# Patient Record
Sex: Female | Born: 1950 | Race: White | Hispanic: No | Marital: Married | Smoking: Never smoker
Health system: Southern US, Community
[De-identification: ages and names within clinical notes are randomized; demographics above are authoritative.]

## PROBLEM LIST (undated history)

## (undated) DIAGNOSIS — R112 Nausea with vomiting, unspecified: Secondary | ICD-10-CM

## (undated) DIAGNOSIS — I480 Paroxysmal atrial fibrillation: Secondary | ICD-10-CM

## (undated) DIAGNOSIS — N200 Calculus of kidney: Secondary | ICD-10-CM

## (undated) DIAGNOSIS — Z9889 Other specified postprocedural states: Secondary | ICD-10-CM

## (undated) DIAGNOSIS — E119 Type 2 diabetes mellitus without complications: Secondary | ICD-10-CM

## (undated) DIAGNOSIS — I10 Essential (primary) hypertension: Secondary | ICD-10-CM

## (undated) HISTORY — PX: HEMORRHOID SURGERY: SHX153

## (undated) HISTORY — DX: Paroxysmal atrial fibrillation: I48.0

## (undated) HISTORY — PX: BREAST LUMPECTOMY: SHX2

## (undated) HISTORY — PX: ADENOIDECTOMY: SUR15

## (undated) HISTORY — DX: Morbid (severe) obesity due to excess calories: E66.01

## (undated) HISTORY — PX: BREAST SURGERY: SHX581

## (undated) HISTORY — PX: TONSILLECTOMY: SUR1361

## (undated) HISTORY — PX: ECTOPIC PREGNANCY SURGERY: SHX613

---

## 1999-04-27 ENCOUNTER — Ambulatory Visit (HOSPITAL_BASED_OUTPATIENT_CLINIC_OR_DEPARTMENT_OTHER): Admission: RE | Admit: 1999-04-27 | Discharge: 1999-04-27 | Payer: Self-pay | Admitting: Plastic Surgery

## 2000-07-12 ENCOUNTER — Encounter: Payer: Self-pay | Admitting: Family Medicine

## 2000-07-12 ENCOUNTER — Ambulatory Visit (HOSPITAL_COMMUNITY): Admission: RE | Admit: 2000-07-12 | Discharge: 2000-07-12 | Payer: Self-pay | Admitting: Family Medicine

## 2000-08-09 ENCOUNTER — Other Ambulatory Visit: Admission: RE | Admit: 2000-08-09 | Discharge: 2000-08-09 | Payer: Self-pay | Admitting: Obstetrics and Gynecology

## 2000-08-09 ENCOUNTER — Encounter (INDEPENDENT_AMBULATORY_CARE_PROVIDER_SITE_OTHER): Payer: Self-pay

## 2000-08-28 ENCOUNTER — Ambulatory Visit (HOSPITAL_COMMUNITY): Admission: RE | Admit: 2000-08-28 | Discharge: 2000-08-28 | Payer: Self-pay | Admitting: Obstetrics and Gynecology

## 2000-08-28 ENCOUNTER — Encounter (INDEPENDENT_AMBULATORY_CARE_PROVIDER_SITE_OTHER): Payer: Self-pay | Admitting: Specialist

## 2001-09-11 ENCOUNTER — Other Ambulatory Visit: Admission: RE | Admit: 2001-09-11 | Discharge: 2001-09-11 | Payer: Self-pay | Admitting: Obstetrics and Gynecology

## 2001-10-03 HISTORY — PX: ABDOMINAL HYSTERECTOMY: SHX81

## 2002-04-24 ENCOUNTER — Encounter (INDEPENDENT_AMBULATORY_CARE_PROVIDER_SITE_OTHER): Payer: Self-pay

## 2002-04-24 ENCOUNTER — Observation Stay (HOSPITAL_COMMUNITY): Admission: RE | Admit: 2002-04-24 | Discharge: 2002-04-25 | Payer: Self-pay | Admitting: Obstetrics and Gynecology

## 2002-08-09 ENCOUNTER — Ambulatory Visit (HOSPITAL_COMMUNITY): Admission: RE | Admit: 2002-08-09 | Discharge: 2002-08-09 | Payer: Self-pay | Admitting: Family Medicine

## 2002-08-09 ENCOUNTER — Encounter: Payer: Self-pay | Admitting: Family Medicine

## 2003-04-30 ENCOUNTER — Encounter: Payer: Self-pay | Admitting: Emergency Medicine

## 2009-02-02 ENCOUNTER — Emergency Department (HOSPITAL_COMMUNITY): Admission: EM | Admit: 2009-02-02 | Discharge: 2009-02-03 | Payer: Self-pay | Admitting: *Deleted

## 2011-01-11 LAB — DIFFERENTIAL
Basophils Relative: 0 % (ref 0–1)
Eosinophils Absolute: 0.3 10*3/uL (ref 0.0–0.7)
Eosinophils Relative: 4 % (ref 0–5)
Lymphs Abs: 1.7 10*3/uL (ref 0.7–4.0)
Monocytes Absolute: 0.5 10*3/uL (ref 0.1–1.0)
Monocytes Relative: 6 % (ref 3–12)
Neutrophils Relative %: 70 % (ref 43–77)

## 2011-01-11 LAB — CBC
HCT: 40 % (ref 36.0–46.0)
Hemoglobin: 13.7 g/dL (ref 12.0–15.0)
MCHC: 34.1 g/dL (ref 30.0–36.0)
MCV: 92.5 fL (ref 78.0–100.0)
RBC: 4.33 MIL/uL (ref 3.87–5.11)
WBC: 8.3 10*3/uL (ref 4.0–10.5)

## 2011-01-11 LAB — POCT CARDIAC MARKERS
CKMB, poc: <1 ng/mL — ABNORMAL LOW (ref 1.0–8.0)
CKMB, poc: <1 ng/mL — ABNORMAL LOW (ref 1.0–8.0)
Troponin i, poc: <0.05 ng/mL (ref 0.00–0.09)
Troponin i, poc: <0.05 ng/mL (ref 0.00–0.09)

## 2011-01-11 LAB — BASIC METABOLIC PANEL
BUN: 11 mg/dL (ref 6–23)
CO2: 28 mEq/L (ref 19–32)
Calcium: 9.4 mg/dL (ref 8.4–10.5)
Chloride: 98 mEq/L (ref 96–112)
Creatinine, Ser: 0.59 mg/dL (ref 0.4–1.2)
GFR calc Af Amer: >60 mL/min (ref >60)
GFR calc non Af Amer: >60 mL/min (ref >60)
Glucose, Bld: 107 mg/dL — ABNORMAL HIGH (ref 70–99)
Potassium: 3.1 mEq/L — ABNORMAL LOW (ref 3.5–5.1)
Sodium: 137 mEq/L (ref 135–145)

## 2011-01-11 LAB — BRAIN NATRIURETIC PEPTIDE: Pro B Natriuretic peptide (BNP): 40 pg/mL (ref 0.0–100.0)

## 2011-02-18 NOTE — H&P (Signed)
[facility]  Patient:    Lindsey Jordan, Lindsey Jordan                       MRN: [id number] Adm. Date:  08/23/00 Attending:  Janine Limbo, M.D. CC:         Dario Guardian, M.D.   History and Physical  HISTORY OF PRESENT ILLNESS:   The patient is a 60 year old female, para 1, 0, 3, 1, who presents for a hysteroscopy and a dilatation and curettage.  The patient has a history of irregular bleeding, a fibroid uterus, and an ovarian cyst.  She is status post left salpingo-oophorectomy because of an ectopic pregnancy.  She has had a cesarean section in the past.  She is also status post laparoscopy.  She had a D&C associated with her miscarriages.  The patient has a history of HSV, but denies any other history of sexually-transmitted infections.  An ultrasound was performed and it showed a 9.6 cm x 6.1 cm uterus.  The endometrial stripe measured 10.0 mm.  A 2.8 cm fibroid was noted.  The right ovary measured 4.2 cm x 1.8 cm.  It contained a simple cyst measuring 2.0 cm x 1.8 cm.  The patient has taken hormone replacement therapy in the past, but has discontinued this medication.  Her most recent Pap smear was in March 2001, and it was within normal limits.  Her last mammogram was in March 2001, and it was within normal limits.  The patient had an ultrasound of her gallbladder in November 2001, and the upper abdomen appeared normal, and there was no evidence of cholelithiasis.  OBSTETRICAL HISTORY:          The patient has had one preterm vaginal delivery, two miscarriages, and one ectopic pregnancy.  The patient did require Clomid and Pergonal for conception.  PAST MEDICAL HISTORY:         The patient has a history of mitral valve prolapse.  She also has migraine headaches.  She has had kidney stones in the past.  ALLERGIES:                    SHELL FISH.  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  She drinks  alcohol socially.  She denies cigarette use and recreational drug use.  REVIEW OF SYSTEMS:            The patient has urinary incontinence.  She had the usual childhood diseases.  She wears contact lenses.  FAMILY HISTORY:               The patients father has emphysema and hypertension.  The patients mother had kidney stones and thyroid disease.  PHYSICAL EXAMINATION:  GENERAL:                      Weight 203 pounds, height 5 feet 8 inches.  HEENT:                        Within normal limits.  CHEST:                        Clear.  HEART:                        Regular  rate and rhythm.  BREASTS:                      Without masses.  ABDOMEN:                      Nontender.  EXTREMITIES:                  Within normal limits.  NEUROLOGIC:                   Normal.  PELVIC:                       External genitalia normal.  Vagina is normal except relaxation is noted.  Cervix is nontender.  No lesions appreciated. Uterus is upper limits normal size.  Adnexa:  No masses.  RECTOVAGINAL:                 Examination confirms.  ASSESSMENT:                   1. Irregular uterine bleeding.                               2. Fibroid uterus.                               3. Thickened endometrium on ultrasound.                               4. Cervical stenosis (unable to obtain an                                  endometrial biopsy in the office                                  comfortably).  PLAN:                         We discussed the options for management.  The patient elects to proceed at this point with a hysteroscopy and a dilatation and curettage.  She understands and accepts the risks of, but not limited to, anesthetic complications, bleeding, infections, and possible damage to the surrounding organs. DD:  08/23/00 TD:  08/23/00 Job: [id number]

## 2011-02-18 NOTE — Op Note (Signed)
[facility]  Patient:    Lindsey Jordan, Lindsey Jordan                     MRN: [id number] Adm. Date:  [id number] Disc. Date: [id number] Attending:  Leonard Schwartz CC:         Dario Guardian, M.D.   Operative Report  PREOPERATIVE DIAGNOSES:       1. Irregular uterine bleeding.                               2. Cervical stenosis.                               3. Fibroid uterus.  POSTOPERATIVE DIAGNOSES:      1. Irregular uterine bleeding.                               2. Cervical stenosis.                               3. Fibroid uterus.                               4. Endometrial polyp.  PROCEDURE:                    1. Diagnostic hysteroscopy.                               2. Dilatation and curettage.  SURGEON:                      Janine Limbo, M.D.  ANESTHESIA:                   IV sedation and paracervical block.  DISPOSITION:                  Ms. Mccoll is a 60 year old female with the above mentioned diagnoses.  An endometrial biopsy was attempted in the office but we were unable to get endometrial cells.  The patient understands the indications for her procedure and she accepts the risks of, but not limited to, anesthetic complications, bleeding, infection, and possible damage to the surrounding organs.  FINDINGS:                     The uterus was 8 weeks size and slightly irregular.  No adnexal masses were appreciated.  The uterus sounded to 9 cm. On hysteroscopy the patient was noted to have small endometrial polyps.  No other pathology was noted.  PROCEDURE:                    The patient was taken to the operating room where she was given medication through her IV line.  The perineum and vagina were prepped with multiple layers of Hibiclens.  The bladder was drained of urine.  Examination under anesthesia was performed.  The patient was sterilely draped.  A paracervical block was placed using 10 cc of 1/2% Marcaine.   An endocervical curettage was obtained.  The cervix and uterus sounded to 9 cm. The cervix  was grasped with a dilator.  The uterine cavity was then explored using the diagnostic hysteroscope.  She was found to have polyps.  The cavity was then curetted using a medium sharp curette until the cavity was felt to be completely clean.  Repeat hysteroscopy was performed and no polyps were noted. The instruments were removed from the uterine cavity.  There were two lacerations on the cervix from the single tooth tenaculum.  Two figure-of-eight sutures of 2-0 Chromic were placed.  Hemostasis was adequate. All instruments were then removed.  The patients examination was repeated and the uterus was firm.  The patient was returned to the supine position and then taken to the recovery room in stable condition.  The estimated blood loss was 50 cc.  The fluid deficit was 140 cc.  FOLLOW-UP INSTRUCTIONS:       The patient will return to see Dr. Stefano Gaul in two to three weeks for follow-up examination.  She was given a copy of the postoperative sheet as prepared by the [facility] for patients who have undergone a D&C.  She will call for questions or concerns.  DISCHARGE MEDICATIONS:        Tylenol No. 3 one to two p.o. q.4h. p.r.n. pain. DD:  08/28/00 TD:  08/28/00 Job: [id number]

## 2011-02-18 NOTE — H&P (Signed)
[facility]  Patient:    Lindsey Jordan, Lindsey Jordan Visit Number: [id number] MRN: [id number]          Service Type: Attending:  Janine Limbo, M.D. Dictated by:   Janine Limbo, M.D. Adm. Date:  04/24/02   CC:         Dario Guardian, M.D.   History and Physical  HISTORY OF PRESENT ILLNESS:   The patient is a 60 year old female para 0-1-3-1 who presents for a laparoscopically-assisted vaginal hysterectomy with right salpingo-oophorectomy and cystoscopy.  The patient has a long history of irregular menstrual cycles.  She had a hysteroscopy with dilatation and curettage performed in 2001 and she was found to have polyps.  She is known to have a post menopausal FSH.  The patient has been treated with hormone replacement therapy but continues to have irregular and bothersome cycles. She has also had a laparoscopy in the past as well as D&Cs associated with miscarriages.  The patient had a left salpingo-oophorectomy performed because of an ectopic pregnancy.  She has had a cesarean section in the past.  An ultrasound in the past has shown a fibroid uterus.  The patient has a history of HSV but denied any other history of sexually transmitted infections.  Her most recent Pap smear was in December 2002 and it was within normal limits.  OBSTETRICAL HISTORY:          The patient had a preterm delivery by cesarean section.  She has had two miscarriages and one ectopic pregnancy.  She did require Clomid and Pergonal for conception.  PAST MEDICAL HISTORY:         The patient has a history of mitral valve prolapse.  She has kidney stones and migraine headaches.  DRUG ALLERGIES:               The patient is allergic to [facility] and SHELLFISH. Biaxin upsets her stomach, however.  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 She drinks alcohol socially.  She denies cigarette use and recreational drug use.  REVIEW OF SYSTEMS:             Noncontributory.  FAMILY HISTORY:               The patients father has emphysema and hypertension.  The patients mother has kidney stones and thyroid disease.  PHYSICAL EXAMINATION:  VITAL SIGNS:                  Weight 197 pounds.  HEENT:                        Within normal limits.  CHEST:                        Clear.  HEART:                        Regular rate and rhythm.  BREASTS:                      Without masses.  ABDOMEN:                      Nontender.  EXTREMITIES:                  Within  normal limits.  NEUROLOGIC:                   Grossly normal.  PELVIC:                       External genitalia is normal.  The vagina is normal except for relaxation.  The cervix is nontender.  The uterus is upper limits of normal size, adnexa no masses, and rectovaginal exam confirms.  ASSESSMENT:                   1. Irregular uterine bleeding.                               2. Fibroid uterus.  PLAN:                         After carefully reviewing the options for management, the patient has decided to proceed with laparoscopically-assisted vaginal hysterectomy and right salpingo-oophorectomy.  She understands the indications for her procedure and she accepts the risks of, but not limited to, anesthetic complications, bleeding, infections, and possible damage to the surrounding organs. Dictated by:   Janine Limbo, M.D. Attending:  Janine Limbo, M.D. DD:  04/15/02 TD:  04/15/02 Job: [id number]

## 2011-02-18 NOTE — Discharge Summary (Signed)
NAME:  Lindsey Jordan, Lindsey Jordan                        ACCOUNT NO.:  [id number]   MEDICAL RECORD NO.:  [id number]                   PATIENT TYPE:  OBV   LOCATION:  9314                                 FACILITY:  [facility]   PHYSICIAN:  Elmira J. Lowell Guitar, P.A.              DATE OF BIRTH:  Jun 13, 1951   DATE OF ADMISSION:  04/24/2002  DATE OF DISCHARGE:  04/25/2002                                 DISCHARGE SUMMARY   DISCHARGE DIAGNOSES:  1. Fibroid uterus.  2. Irregular bleeding.  3. Pelvic adhesions.   OPERATION:  On the day of admission, the patient underwent a  laparoscopically assisted vaginal hysterectomy with a right salpingo-  oophorectomy, lysis of adhesions and cystoscopy tolerating all procedures  well.   HISTORY OF PRESENT ILLNESS:  Ms. Stjames is a 60 year old female para 0131  who presents for a laparoscopically assisted vaginal hysterectomy with right  salpingo-oophorectomy and cystoscopy due to a long history of irregular  menstrual cycles.  Please see patient's dictated History and Physical  examination for details.   PHYSICAL EXAMINATION:  Weight 197 pounds.  GENERAL:  Within normal limits.  PELVIC:  External genitalia is normal.  Vagina is normal except for  relaxation.  Cervix is nontender.  Uterus upper limits of normal size.  Adnexa no masses.  RECTOVAGINAL:  Examination confirms.   HOSPITAL COURSE:  On the day of admission, the patient underwent  aforementioned procedures, tolerating them all well.  Postoperative course  was unremarkable with patient quickly resuming bowel and bladder function by  postoperative day 1 and deemed ready for discharge home.  Postoperative  hemoglobin 11.5, preoperative hemoglobin 13.4.   DISCHARGE MEDICATIONS:  1. Vicodin one to tablets every 4-6 hours as needed for pain.  2. Phenergan 25 mg one tablet four times daily as needed for nausea.  3. Stool softeners 100 mg twice daily until bowel movements are regular.  4. Ibuprofen 600 mg  one tablet with food every 6 hours for five days and     then as needed for pain.   FOLLOW UP:  Patient has a six-week postoperative examination scheduled on  May 31, 2002 at 10:30 a.m. with Dr. Leonard Schwartz.   DISCHARGE INSTRUCTIONS:  Patient was given a copy of [facility]  Obstetrics and Gynecology postoperative instruction sheet.  She was further  advised to avoid driving for two weeks, heavy lifting for four weeks and  intercourse for six weeks.   DISCHARGE DIET:  Without restrictions.    FINAL PATHOLOGY:  Uterus with cervix, right tube and ovary:  Cervix with no  pathologic abnormalities, benign proliferative endometrium, adenomyosis,  benign 2.5 cm subserosal leiomyoma, right ovary with rare 9-kDa and  granuloma, right fallopian tube with no pathologic abnormalities, uterine  serosal fibrous adhesions.  Elmira J. Adline Peals.    EJP/MEDQ  D:  05/17/2002  T:  05/17/2002  Job:  [id number]

## 2011-02-18 NOTE — Op Note (Signed)
[facility]  Patient:    Lindsey Jordan, Lindsey Jordan Visit Number: [id number] MRN: [id number]          Service Type: DSU Location: 9300 [id number] Attending Physician:  Leonard Schwartz Dictated by:   Janine Limbo, M.D. Proc. Date: 04/24/02 Admit Date:  04/24/2002 Discharge Date: 04/25/2002                             Operative Report  DATE OF BIRTH:                December 26, 1950  PREOPERATIVE DIAGNOSES:       1. Fibroid uterus.                               2. Irregular menstrual cycles.  POSTOPERATIVE DIAGNOSES:      1. Fibroid uterus.                               2. Irregular menstrual cycles.                               3. Pelvic adhesive disease.  PROCEDURE:                    1. Laparoscopically assisted vaginal                                  hysterectomy.                               2. Laparoscopic right salpingo-oophorectomy.                               3. Laparoscopic lysis of adhesions.                               4. Cystoscopy.  SURGEON:                      Janine Limbo, M.D.  FIRST ASSISTANT:              Henreitta Leber, P.A.  ANESTHESIA:                   General.  DISPOSITION:                  Lindsey Jordan is a 60 year old female who presents with the above mentioned diagnoses.  She is status post left salpingo-oophorectomy.  She understands the indications for her procedure and she accepts the risks of, but not limited to, anesthetic complications, bleeding, infections, and possible damage to the surrounding organs.  We have discussed hormone replacement therapy and we have also discussed the risks and benefits thereof.  The [facility] initiative study was reviewed with the patient.  FINDINGS:                     The patients uterus was upper limits normal size and there was a pedunculated myoma on the anterior surface of the uterus  which measured about 4 cm in diameter.  The patient had moderate  adhesions between the omentum and the anterior abdominal wall.  She had moderate adhesions between the anterior uterus and the bladder.  She had dense adhesions between the left fallopian tube and the left posterior uterus.  The appendix appeared normal.  The gallbladder and the liver appeared normal.  The bowel appeared normal.  PROCEDURE:                    The patient was taken to the operating room where a general anesthetic was given.  The patients abdomen, perineum, and vagina were prepped with multiple layers of Betadine.  A Foley catheter was placed in the bladder.  A Hulka tenaculum was placed inside the uterus.  The patient was then sterilely draped.  The subumbilical area was injected with 4 cc of 0.5% Marcaine with epinephrine.  A subumbilical incision was made and the incision was extended through the subcutaneous tissue, the fascia, and the anterior peritoneum.  The Hasson cannula was sutured into place using 0 Vicryl.  A pneumoperitoneum was obtained.  The laparoscope was inserted and the pelvic structures were visualized with findings as mentioned above.  An incision was made in the right lower quadrant after injecting the skin with 3 cc of 0.5% Marcaine with epinephrine.  The 5 mm trocar was placed in the right lower quadrant under direct visualization.  We then began the lysis of adhesions portion of our procedure so that we could adequately view the entire pelvis.  The omentum was removed from the anterior abdominal wall using a combination of electrocautery, sharp dissection, and then blunt dissection. Once we were able to visualize the pelvis better we then took pictures of the pelvic and abdominal structures.  The left lower quadrant was injected with 3 cc of 0.5% Marcaine with epinephrine.  An incision was made and a second 5 mm trocar was placed in the lower abdomen.  We then began to lyse the adhesions between the anterior uterus and the bladder.  The adhesions in  the posterior cul-de-sac were lysed.  We were then able to identify the ureter and follow the ureter through its course on the right broad ligament.  The right round ligament was cauterized and cut.  The right utero-ovarian ligament and the right fallopian tube were then cauterized and cut.  The right infundibulopelvic ligament was then skeletonized.  Care was taken not to damage any of the underlying vital structures.  We then placed two 0 Vicryl endo loop sutures around the right infundibulopelvic ligament and tied them securely.  The right infundibulopelvic ligament was cut and the right ovary and the right fallopian tube were placed into the posterior cul-de-sac.  We then skeletonized the right uterine artery using a combination of Bovie cautery and then sharp dissection.  At this point we felt that we were ready to proceed with the vaginal hysterectomy portion of the procedure.  A weighted speculum was placed in the vagina and the cervix was injected with a diluted solution of Pitressin and saline.  A circumferential incision was made around the cervix.  The vaginal mucosa was advanced both posteriorly and anteriorly. The posterior cul-de-sac was sharply entered.  Alternating from right to left the paracervical tissues, parametrial tissues, and uterine arteries were clamped, cut, sutured, and tied securely.  The dissection was made more difficult because of poor mobility of the uterus even in spite of the previous laparoscopic approach.  We  were then able to invert the uterus through the posterior colpotomy.  The remaining uterine attachments were clamped and cut. The uterus was removed from the operative field.  The ovary and fallopian tube were then removed from the posterior cul-de-sac.  The remaining tissues were suture ligated.  Hemostasis was noted to be adequate.  The sutures attached to the uterosacral ligaments were then brought out through the vaginal angles and tied  securely.  A McCall culdoplasty suture was placed in the posterior cul-de-sac incorporating the uterosacral ligament bilaterally and the  peritoneum of the posterior cul-de-sac.  A final check was made for hemostasis and hemostasis was adequate.  The vaginal cuff was then closed using figure-of-eight sutures incorporating the anterior vaginal mucosa, the anterior peritoneum, the posterior peritoneum, and the posterior vaginal mucosa.  The culdoplasty suture was placed and the apex of the vagina was noted to elevate into the mid pelvis.  The patient was then given an ampule of indigo carmine.  The diagnostic cystoscope was inserted in the patients bladder and there was no evidence of damage to the mucosa of the bladder. Blue dye was noted to pass through both ureteral orifices.  The cystoscope was removed and the Foley catheter was reinserted into the bladder.  The surgeon then changed his gloves.  A pneumoperitoneum was once again obtained.  The pelvis was irrigated.  Hemostasis was noted to be adequate throughout.  The fluid that was placed in the abdomen was then aspirated.  The bowel was carefully checked and there was no evidence of trocar damage.  All instruments were then removed.  The fascia in the subumbilical area was then closed using figure-of-eight sutures.  The skin of the subumbilical laceration and the two suprapubic lacerations were then closed using 3-0 Vicryl.  Sponge, needle, and instrument counts were correct.  0 Vicryl was the suture material used throughout the procedure except where otherwise mentioned.  The patient was noted to have blue urine as she was taken to the recovery room.  She tolerated her procedure well.  She did experience hypertension during her procedure which required IV antihypertensives. Dictated by:   Janine Limbo, M.D. Attending Physician:  Leonard Schwartz DD:  04/24/02 TD:  04/28/02 Job: [id number]

## 2011-06-14 ENCOUNTER — Other Ambulatory Visit: Payer: Self-pay | Admitting: Physician Assistant

## 2013-03-20 ENCOUNTER — Encounter (HOSPITAL_COMMUNITY): Payer: Self-pay

## 2013-03-21 ENCOUNTER — Encounter (HOSPITAL_COMMUNITY): Payer: Self-pay

## 2013-03-26 ENCOUNTER — Encounter (HOSPITAL_COMMUNITY): Payer: Self-pay

## 2013-03-26 ENCOUNTER — Ambulatory Visit (HOSPITAL_COMMUNITY)
Admission: RE | Admit: 2013-03-26 | Discharge: 2013-03-26 | Disposition: A | Discharge status: Home / Self Care | Payer: Self-pay | Source: Ambulatory Visit | Attending: Obstetrics and Gynecology | Admitting: Obstetrics and Gynecology

## 2013-03-26 VITALS — BP 118/76 | Temp 97.8°F | Ht <= 58 in | Wt 229.2 lb

## 2013-03-26 DIAGNOSIS — Z1239 Encounter for other screening for malignant neoplasm of breast: Secondary | ICD-10-CM

## 2013-03-26 HISTORY — DX: Essential (primary) hypertension: I10

## 2013-03-26 NOTE — Patient Instructions (Signed)
Taught Hal Hope how to perform BSE and gave educational materials to take home. Patient did not need a Pap smear today due to a history of a hysterectomy for benign reasons. Let patient know that she does not need any further Pap smears due to her history of a hysterectomy for benign reasons. Referred patient to [facility] for a right breast biopsy per recommendation. Appointment scheduled for today, March 26, 2013 at 1345. Patient aware of appointment and will be there. Loel Ro Karn verbalized understanding.  Ruhama Lehew, Kathaleen Maser, RN 11:46 AM

## 2013-03-26 NOTE — Progress Notes (Signed)
Patient referred to [facility] by [facility] due to recommending a right breast biopsy. Screening mammogram completed at [facility] on 03/20/2013 and follow up right breast diagnostic mammogram completed 03/21/2013.  Pap Smear:    Pap smear not completed today. Last Pap smear was in 2003 per patient prior to hysterectomy and normal. Per patient no history of an abnormal Pap smear. Patient has a history of a hysterectomy for fibroids 04/24/2002. Patient no longer needs Pap smears per BCCCP and ACOG guidelines due to her history of a hysterectomy for benign reasons. No Pap smear results in EPIC.  Physical exam: Breasts Right breast larger than left breast. No skin abnormalities bilateral breasts. No nipple retraction bilateral breasts. No nipple discharge bilateral breasts. No lymphadenopathy. No lumps palpated bilateral breasts. Patient complained of right lower breast tenderness during exam. Referred patient to [facility] for a right breast biopsy per recommendation. Appointment scheduled for today, March 26, 2013 at 1345  Pelvic/Bimanual No Pap smear completed today since patient has a history of a hysterectomy for benign reasons. Pap smear not indicated per BCCCP guidelines.

## 2013-05-14 ENCOUNTER — Other Ambulatory Visit: Payer: Self-pay | Admitting: Radiology

## 2013-05-14 ENCOUNTER — Encounter (HOSPITAL_COMMUNITY): Payer: Self-pay

## 2013-11-14 ENCOUNTER — Encounter (HOSPITAL_COMMUNITY): Payer: Self-pay

## 2014-04-25 ENCOUNTER — Ambulatory Visit (INDEPENDENT_AMBULATORY_CARE_PROVIDER_SITE_OTHER): Payer: BC Managed Care – PPO | Admitting: Cardiology

## 2014-04-25 ENCOUNTER — Encounter: Payer: Self-pay | Admitting: Cardiology

## 2014-04-25 VITALS — BP 142/80 | HR 63 | Ht 68.0 in | Wt 225.0 lb

## 2014-04-25 DIAGNOSIS — R0600 Dyspnea, unspecified: Secondary | ICD-10-CM

## 2014-04-25 DIAGNOSIS — E7800 Pure hypercholesterolemia, unspecified: Secondary | ICD-10-CM

## 2014-04-25 DIAGNOSIS — R011 Cardiac murmur, unspecified: Secondary | ICD-10-CM

## 2014-04-25 DIAGNOSIS — R0989 Other specified symptoms and signs involving the circulatory and respiratory systems: Secondary | ICD-10-CM

## 2014-04-25 DIAGNOSIS — R0609 Other forms of dyspnea: Secondary | ICD-10-CM

## 2014-04-25 DIAGNOSIS — R9431 Abnormal electrocardiogram [ECG] [EKG]: Secondary | ICD-10-CM

## 2014-04-25 DIAGNOSIS — I119 Hypertensive heart disease without heart failure: Secondary | ICD-10-CM

## 2014-04-25 NOTE — Progress Notes (Signed)
Lindsey Jordan Date of Birth:  04-Dec-1950 [facility] [address] [place], [place]  [address] [phone]        Fax   [phone]   History of Present Illness: This pleasant 63 year old woman is seen by me for the first time today.  She is seen at the request of Dr. Avriana Ada.  She is being seen because of history of hypercholesterolemia.  She has a strong family history of heart disease.  She herself has not had documented heart problems.  In 2010 she went to the emergency room with chest pain and was released after her cardiac enzymes returned to normal.  The patient does not get any regular exercise because of some problems with her knees and arthritis.  Her last stress test was about 20 years ago in [place].  She has been experiencing some exertional dyspnea.  Her EKG today shows evidence of a questionable old inferior wall myocardial infarction.  The patient has a past history of a heart murmur.  She has not had an echocardiogram.  She has a history of high blood pressure.  She does not smoke.  She has a history of hypercholesterolemia.  She has had previous trials of Lipitor and Crestor and WelChol all of which caused deep muscle discomfort more in her arms and in her legs.  She has had problems with mild exogenous obesity and her weight is up about 10 pounds over the past year. Her family history reveals that her father died of a heart attack at age 46.  Her mother died of congestive heart failure and had a history of atrial fibrillation and amiodarone toxicity affecting her lungs. Her most recent lipid panel done on 03/17/14 by Dr. Tamala Julian shows total cholesterol 317, triglycerides 357, HDL 41, calculated LDL 204, and non-HDL to 75 her cholesterol/HDL ratio is 7.7.  She is not currently on any lipid lowering agents. Current Outpatient Prescriptions  Medication Sig Dispense Refill  . acyclovir (ZOVIRAX) 200 MG capsule Take 200 mg by mouth 3 (three) times daily.  As directed      . atenolol (TENORMIN) 100 MG tablet Take 100 mg by mouth daily.      . clonazePAM (KLONOPIN) 1 MG tablet Take 1 mg by mouth as directed. 1/2-1 tablet at bedtime as needed      . Cranberry 200 MG CAPS Take by mouth daily.      Marland Kitchen FLUTICASONE PROPIONATE  HFA IN Inhale into the lungs. 2 puffs each nostril daily      . Lactobacillus (ACIDOPHILUS) TABS Take by mouth daily.      . Multiple Vitamin (MULTIVITAMIN) capsule Take 1 capsule by mouth daily.      . traZODone (DESYREL) 100 MG tablet Take 100 mg by mouth at bedtime.      . triamterene-hydrochlorothiazide (MAXZIDE-25) 37.5-25 MG per tablet Take 1 tablet by mouth daily.       No current facility-administered medications for this visit.    Allergies  Allergen Reactions  . Biaxin [Clarithromycin]   . Statins     Side effects  . Welchol [Colesevelam Hcl]     Side effects    Patient Active Problem List   Diagnosis Date Noted  . Dyspnea on exertion 04/25/2014  . Hypercholesteremia 04/25/2014  . Heart murmur 04/25/2014  . Benign hypertensive heart disease without heart failure 04/25/2014  . Abnormal EKG 04/25/2014    History  Smoking status  . Never Smoker  Smokeless tobacco  . Never Used    History  Alcohol Use No    Family History  Problem Relation Age of Onset  . Hypertension Father   . Breast cancer Maternal Aunt     Review of Systems: Constitutional: no fever chills diaphoresis or fatigue or change in weight.  Head and neck: no hearing loss, no epistaxis, no photophobia or visual disturbance. Respiratory: No cough, shortness of breath or wheezing. Cardiovascular: No chest pain peripheral edema, palpitations. Gastrointestinal: No abdominal distention, no abdominal pain, no change in bowel habits hematochezia or melena. Genitourinary: No dysuria, no frequency, no urgency, no nocturia. Musculoskeletal:No arthralgias, no back pain, no gait disturbance or myalgias. Neurological: No dizziness, no  headaches, no numbness, no seizures, no syncope, no weakness, no tremors. Hematologic: No lymphadenopathy, no easy bruising. Psychiatric: No confusion, no hallucinations, no sleep disturbance.    Physical Exam: Filed Vitals:   04/25/14 1025  BP: 142/80  Pulse: 63   the general appearance reveals a well-developed slightly obese woman in no acute distress.The head and neck exam reveals pupils equal and reactive.  Extraocular movements are full.  There is no scleral icterus.  The mouth and pharynx are normal.  The neck is supple.  The carotids reveal no bruits.  The jugular venous pressure is normal.  The  thyroid is not enlarged.  There is no lymphadenopathy.  The chest is clear to percussion and auscultation.  There are no rales or rhonchi.  Expansion of the chest is symmetrical.  The precordium is quiet.  The first heart sound is normal.  The second heart sound is physiologically split.  There is grade 1/6 systolic ejection murmur at the aortic area.  No diastolic murmur. There is no abnormal lift or heave.  The abdomen is soft and nontender.  The bowel sounds are normal.  The liver and spleen are not enlarged.  There are no abdominal masses.  There are no abdominal bruits.  Extremities reveal good pedal pulses.  There is no phlebitis or edema.  There is no cyanosis or clubbing.  Strength is normal and symmetrical in all extremities.  There is no lateralizing weakness.  There are no sensory deficits.  The skin is warm and dry.  There is no rash.   EKG shows normal sinus rhythm with voltage for LVH and questionable old inferior wall myocardial infarction with narrow Q waves in 3 and aVF.  Assessment / Plan: 1. severe hypercholesterolemia, intolerant of statins and WelChol 2. heart murmur 3. exertional dyspnea 4. abnormal EKG  Plan: We will have her return for a two-dimensional echocardiogram to evaluate her heart murmur and her dyspnea.  We will also have her return for a likely scan Myoview  stress test to evaluate her dyspnea and her abnormal EKG.  She is at risk for premature coronary disease with her marked hypercholesterolemia and strong family history.  We will refer her to Dr. Alferd Apa in the lipid clinic.  Thanks for the opportunity to see this pleasant woman with you.

## 2014-04-25 NOTE — Assessment & Plan Note (Signed)
EKG today shows normal sinus rhythm with questionable old inferior wall MI.  She does not have any history of a known prior infarction.  She did have an emergency room visit in 2010 for chest pain.

## 2014-04-25 NOTE — Assessment & Plan Note (Signed)
The patient states that she's had a heart murmur which has been heard intermittently over the past several years

## 2014-04-25 NOTE — Assessment & Plan Note (Signed)
Patient has severe hypercholesterolemia and is intolerant of WelChol and statins.  We will plan to refer her to the lipid clinic

## 2014-04-25 NOTE — Assessment & Plan Note (Signed)
The patient has a history of essential hypertension.  She has been experiencing exertional dyspnea.

## 2014-04-25 NOTE — Patient Instructions (Signed)
Your physician recommends that you continue on your current medications as directed. Please refer to the Current Medication list given to you today.  Your physician has requested that you have an echocardiogram. Echocardiography is a painless test that uses sound waves to create images of your heart. It provides your doctor with information about the size and shape of your heart and how well your heart's chambers and valves are working. This procedure takes approximately one hour. There are no restrictions for this procedure.  Your physician has requested that you have a lexiscan myoview. For further information please visit [email or web address]. Please follow instruction sheet, as given.  Refer to [employer] D in lipid clinic

## 2014-05-01 ENCOUNTER — Ambulatory Visit (INDEPENDENT_AMBULATORY_CARE_PROVIDER_SITE_OTHER): Payer: BC Managed Care – PPO | Admitting: Pharmacist

## 2014-05-01 VITALS — Wt 226.0 lb

## 2014-05-01 DIAGNOSIS — Z79899 Other long term (current) drug therapy: Secondary | ICD-10-CM

## 2014-05-01 DIAGNOSIS — E7800 Pure hypercholesterolemia, unspecified: Secondary | ICD-10-CM

## 2014-05-01 MED ORDER — PRAVASTATIN SODIUM 40 MG PO TABS: 40.0000 mg | ORAL_TABLET | Freq: Every evening | ORAL | Status: DC

## 2014-05-01 NOTE — Progress Notes (Signed)
Patient is a pleasant 63 y.o. Female referred to lipid clinic by Dr. Mare Ferrari, who saw patient for Dr. Idora Ada with [facility].  Patient has a h/o TC > 300 and LDL of 246 mg/dL in 08/2012 [facility]).  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  Her EKG this month showed evidence of a questionable old interior wall MI.  She is due for a stress test in 2 weeks.  Patient did go to the ER in 2010 with chest pressure, but was apparently not diagnosed with MI at that time.  She tried lipitor in the late 1990's and failed due to severe muscle aches.  Ten years later she was tried on Crestor, however had to stop due to severe muscle aches again.  Between 2011 and 2013 she tried [facility] a few times, however muscle aches and GI discomfort led her to stop this as well.  She has a brother with elevated cholesterol as well, but she's not sure how high.  She had 4 uncles who had MI's in their 60-70's.  Her father died of an MI at 85.  Mother did not have CAD.  Patient is concerned about taking statins given her history of severe aches on them in the past.  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  She should be treated aggressively with LDL goal of at least < 100 mg/dL if possible.  She is currently eating a low fat diet, and is going the [employer] weight loss program online.  She has lost 5 lbs in her first 2 weeks.  RF:  FH, LDL > 200 mg/dL at baseline, family h/o CAD, possible old MI on EKG - LDL goal < 100 mg/dL Meds:  Not on lipid lowering meds Intolerant:  Crestor, Lipitor, Welchol (muscle aches)  Labs:   03/2014:  TC 317, TG 357, LDL 204, HDL 41, non-HDL 275, LFT normal (not on lipid lowering therapy) --- likely a falsely low LDL due to TG of 357.  Her non-HDL is 275 mg/dL is very elevated !  Current Outpatient Prescriptions  Medication Sig Dispense Refill  . acyclovir (ZOVIRAX) 200 MG capsule Take 200 mg by mouth 3 (three) times  daily. As directed      . atenolol (TENORMIN) 100 MG tablet Take 100 mg by mouth daily.      . clonazePAM (KLONOPIN) 1 MG tablet Take 1 mg by mouth as directed. 1/2-1 tablet at bedtime as needed      . Cranberry 200 MG CAPS Take by mouth daily.      Marland Kitchen FLUTICASONE PROPIONATE  HFA IN Inhale into the lungs. 2 puffs each nostril daily      . Lactobacillus (ACIDOPHILUS) TABS Take by mouth daily.      . Multiple Vitamin (MULTIVITAMIN) capsule Take 1 capsule by mouth daily.      . traZODone (DESYREL) 100 MG tablet Take 100 mg by mouth at bedtime.      . triamterene-hydrochlorothiazide (MAXZIDE-25) 37.5-25 MG per tablet Take 1 tablet by mouth daily.       No current facility-administered medications for this visit.   Allergies  Allergen Reactions  . Biaxin [Clarithromycin]   . Statins     Severe muscle aches with Lipitor (1998) and Crestor (2008)  . Welchol [Colesevelam Hcl]     Side effects - muscle and GI   Family History  Problem Relation Age of Onset  .  Hypertension Father   . Breast cancer Maternal Aunt

## 2014-05-01 NOTE — Assessment & Plan Note (Signed)
Patient and I discussed treatment options today.  She is interested in non-statin options if possible given her history of failing statin.  I explained that these have the best evidence and significant reduction.  We did discuss PCSK-9 inhibitors and I explained that this could be an option if her LDL doesn't get to goal on statin alone. She and her husband have a Sports administrator plan they pay for.   Will have her start with pravastatin 40 mg qhs, and reduce dose if she develops muscle aches.  If aches occur, she will cut this down to 20 mg qd, and down to 10 mg qd if muscle aches persist.  Will recheck lipid / liver in 4 weeks, and see me 1 day later.  She is getting a stress test in 2 weeks.  If LDL remains elevated on her max tolerated dose of statin, will likely try to add Praluent.  Her likely FH diagnosis could get her on therapy, however may have evidence of heart disease on stress test as well.  We discussed this plan today, and she is very interested.  May need to get her up to pravastatin 80 mg qd before we proceed.  Will see her back in 4 weeks after labs and stress test.

## 2014-05-01 NOTE — Patient Instructions (Signed)
1.  Start pravastatin today at 40 mg once daily.  Take in the evening.  If muscle aches occur, cut this in half (20 mg pill).  If this still causes muscle aches, cut this down to 1/4 pill (10 mg daily) 2.  Find out your brother's LDL and Total Cholesterol - I want to know what the levels were before he started cholesterol medication. 3.  Recheck cholesterol in 4 weeks (05/28/14 - fasting labs).  See me 1 day after blood work (05/29/14) at 4:00 pm. 4.  Keep stress test appointment 05/14/14.

## 2014-05-14 ENCOUNTER — Ambulatory Visit (HOSPITAL_BASED_OUTPATIENT_CLINIC_OR_DEPARTMENT_OTHER): Payer: BC Managed Care – PPO | Admitting: Cardiology

## 2014-05-14 ENCOUNTER — Ambulatory Visit (HOSPITAL_COMMUNITY): Payer: BC Managed Care – PPO | Attending: Cardiovascular Disease | Admitting: Radiology

## 2014-05-14 VITALS — BP 134/73 | Ht 68.0 in | Wt 225.0 lb

## 2014-05-14 DIAGNOSIS — R0600 Dyspnea, unspecified: Secondary | ICD-10-CM

## 2014-05-14 DIAGNOSIS — R0609 Other forms of dyspnea: Secondary | ICD-10-CM | POA: Diagnosis present

## 2014-05-14 DIAGNOSIS — I10 Essential (primary) hypertension: Secondary | ICD-10-CM | POA: Diagnosis not present

## 2014-05-14 DIAGNOSIS — R011 Cardiac murmur, unspecified: Secondary | ICD-10-CM

## 2014-05-14 DIAGNOSIS — R0989 Other specified symptoms and signs involving the circulatory and respiratory systems: Secondary | ICD-10-CM | POA: Diagnosis not present

## 2014-05-14 DIAGNOSIS — R9431 Abnormal electrocardiogram [ECG] [EKG]: Secondary | ICD-10-CM

## 2014-05-14 DIAGNOSIS — R0602 Shortness of breath: Secondary | ICD-10-CM

## 2014-05-14 MED ORDER — REGADENOSON 0.4 MG/5ML IV SOLN
0.4000 mg | Freq: Once | INTRAVENOUS | Status: AC
  Administered 2014-05-14: 0.4 mg via INTRAVENOUS

## 2014-05-14 MED ORDER — TECHNETIUM TC 99M SESTAMIBI GENERIC - CARDIOLITE
11.0000 | Freq: Once | INTRAVENOUS | Status: AC | PRN
Start: 2014-05-14 — End: 2014-05-14
  Administered 2014-05-14: 11 via INTRAVENOUS

## 2014-05-14 MED ORDER — TECHNETIUM TC 99M SESTAMIBI GENERIC - CARDIOLITE
33.0000 | Freq: Once | INTRAVENOUS | Status: AC | PRN
  Administered 2014-05-14: 33 via INTRAVENOUS

## 2014-05-14 NOTE — Progress Notes (Signed)
Echo performed. 

## 2014-05-14 NOTE — Progress Notes (Signed)
[facility] [address] [place], [place] [address] [phone]    Cardiology Nuclear Med Study  Lindsey Jordan is a 63 y.o. female     MRN : [id number]     DOB: 1951-08-31  Procedure Date: 05/14/2014  Nuclear Med Background Indication for Stress Test:  Evaluation for Ischemia and Abnormal EKG History:  No H/O CAD Cardiac Risk Factors: Hypertension and Lipids  Symptoms:  DOE   Nuclear Pre-Procedure Caffeine/Decaff Intake:  None NPO After: 7:00pm   Lungs:  clear O2 Sat: 100% on room air. IV 0.9% NS with Angio Cath:  22g  IV Site: R Hand  IV Started by:  Crissie Figures, RN  Chest Size (in):  38 Cup Size: C  Height: 5\' 8"  (1.727 m)  Weight:  225 lb (102.059 kg)  BMI:  Body mass index is 34.22 kg/(m^2). Tech Comments:  N/A    Nuclear Med Study 1 or 2 day study: 1 day  Stress Test Type:  Lexiscan  Reading MD: N/A  Order Authorizing Provider:  Darlin Coco, MD  Resting Radionuclide: Technetium 91m Sestamibi  Resting Radionuclide Dose: 11.0 mCi   Stress Radionuclide:  Technetium 43m Sestamibi  Stress Radionuclide Dose: 33.0 mCi           Stress Protocol Rest HR: 55 Stress HR: 76  Rest BP: 134/73 Stress BP: 154/68  Exercise Time (min): n/a METS: n/a   Predicted Max HR: 156 bpm % Max HR: 48.72 bpm Rate Pressure Product: 11704   Dose of Adenosine (mg):  n/a Dose of Lexiscan: 0.4 mg  Dose of Atropine (mg): n/a Dose of Dobutamine: n/a mcg/kg/min (at max HR)  Stress Test Technologist: Perrin Maltese, EMT-P  Nuclear Technologist:  Annye Rusk, CNMT     Rest Procedure:  Myocardial perfusion imaging was performed at rest 45 minutes following the intravenous administration of Technetium 46m Sestamibi. Rest ECG: Sinus bradycardia, otherwise normal   Stress Procedure:  The patient received IV Lexiscan 0.4 mg over 15-seconds.  Technetium 72m Sestamibi injected at 30-seconds. This patient had sob, chest heaviness, and felt weird with the  Lexiscan injection. Quantitative spect images were obtained after a 45 minute delay. Stress ECG: No significant change from baseline ECG  QPS Raw Data Images:  Normal; no motion artifact; normal heart/lung ratio. Stress Images:  Normal homogeneous uptake in all areas of the myocardium. Rest Images:  Normal homogeneous uptake in all areas of the myocardium. Subtraction (SDS):  No evidence of ischemia. Transient Ischemic Dilatation (Normal <1.22):  1.17 Lung/Heart Ratio (Normal <0.45):  0.32  Quantitative Gated Spect Images QGS EDV:  84 ml QGS ESV:  28 ml  Impression Exercise Capacity:  Lexiscan with no exercise. BP Response:  Normal blood pressure response. Clinical Symptoms:  Mild chest pain/dyspnea. ECG Impression:  No significant ST segment change suggestive of ischemia. Comparison with Prior Nuclear Study: No previous nuclear study performed  Overall Impression:  Normal stress nuclear study.  LV Ejection Fraction: 67%.  LV Wall Motion:  NL LV Function; NL Wall Motion  Dorothy Spark 05/14/2014

## 2014-05-16 ENCOUNTER — Telehealth: Payer: Self-pay | Admitting: Cardiology

## 2014-05-16 NOTE — Telephone Encounter (Signed)
New message       Patient want test results

## 2014-05-16 NOTE — Telephone Encounter (Signed)
Advised patient of echo and myoview  

## 2014-05-16 NOTE — Telephone Encounter (Signed)
Message copied by Earvin Hansen on Fri May 16, 2014  3:53 PM ------      Message from: Darlin Coco      Created: Thu May 15, 2014  7:56 AM       Please report.  The echocardiogram showed normal systolic function and no significant valve problems.  There was a slight leak of her aortic valve which might account for the heart murmur.      Also, of the nuclear stress test did not show any evidence of ischemia or any evidence of the old heart attack.  Continue current medication and regular exercise program. ------

## 2014-05-28 ENCOUNTER — Other Ambulatory Visit (INDEPENDENT_AMBULATORY_CARE_PROVIDER_SITE_OTHER): Payer: BC Managed Care – PPO

## 2014-05-28 DIAGNOSIS — Z79899 Other long term (current) drug therapy: Secondary | ICD-10-CM

## 2014-05-28 DIAGNOSIS — E7800 Pure hypercholesterolemia, unspecified: Secondary | ICD-10-CM

## 2014-05-28 LAB — LDL CHOLESTEROL, DIRECT: Direct LDL: 160.5 mg/dL

## 2014-05-28 LAB — HEPATIC FUNCTION PANEL
ALBUMIN: 3.6 g/dL (ref 3.5–5.2)
ALK PHOS: 84 U/L (ref 39–117)
ALT: 22 U/L (ref 0–35)
AST: 24 U/L (ref 0–37)
Bilirubin, Direct: 0 mg/dL (ref 0.0–0.3)
TOTAL PROTEIN: 7 g/dL (ref 6.0–8.3)
Total Bilirubin: 0.5 mg/dL (ref 0.2–1.2)

## 2014-05-28 LAB — LIPID PANEL
CHOL/HDL RATIO: 5
CHOLESTEROL: 216 mg/dL — AB (ref 0–200)
HDL: 45.5 mg/dL (ref >39.00)
NonHDL: 170.5
TRIGLYCERIDES: 245 mg/dL — AB (ref 0.0–149.0)
VLDL: 49 mg/dL — AB (ref 0.0–40.0)

## 2014-05-29 ENCOUNTER — Ambulatory Visit (INDEPENDENT_AMBULATORY_CARE_PROVIDER_SITE_OTHER): Payer: BC Managed Care – PPO | Admitting: Pharmacist

## 2014-05-29 VITALS — Wt 231.0 lb

## 2014-05-29 DIAGNOSIS — E7800 Pure hypercholesterolemia, unspecified: Secondary | ICD-10-CM

## 2014-05-29 DIAGNOSIS — Z79899 Other long term (current) drug therapy: Secondary | ICD-10-CM

## 2014-05-29 MED ORDER — PRAVASTATIN SODIUM 10 MG PO TABS: 10.0000 mg | ORAL_TABLET | Freq: Every day | ORAL | Status: DC

## 2014-05-29 MED ORDER — ALIROCUMAB 75 MG/ML ~~LOC~~ SOPN: 75.0000 mg | PEN_INJECTOR | SUBCUTANEOUS | Status: DC

## 2014-05-29 NOTE — Assessment & Plan Note (Addendum)
Patient and I had a long discussion about treatment options today.  Unfortunately she hasn't been able to tolerate atorvastatin, crestor, or pravastatin at doses higher than 10 mg qd.  This is the first daily statin she has been able to tolerate, and 10 mg is the highest dose she can tolerate.  She has been cutting the 40 mg pill into 1/4, and would like to change this to the 10 mg pill to take 1 tablet daily (presciption sent).  Welchol caused GI and muscle side effects in past, and Zetia not potent enough, and has been shown to be inferior in patients with HeFH (ENHANCE Trial) to is not appropriate in patient. Praluent is appropriate given she is on highest tolerated statin dose, can't take high potency statin (Crestor or Lipitor), and has diagnosis of HeFH.  Will have her start on low dose Praluent 75 mg SQ q 2 weeks today, and recheck lipid / liver in 5 weeks.  Samples 75 mg # 3 given today in the office and prescription sent in.  Patient counseled on Praluent use, storage, administration, and possible side effects.  She will call back if she has problems before her 5 weeks appointment.

## 2014-05-29 NOTE — Patient Instructions (Signed)
1.  Continue pravastatin 10 mg daily as you couldn't tolerate 20 mg or 40 mg daily. 2.  Start Praluent 75 mg injection once every 2 weeks (take 1st of the month, and middle of the month) 3.  Recheck cholesterol / liver in 5 weeks (07/09/14 fasting labs - lab opens at 7:30 am), and see Lindsey Jordan next day 07/10/14 at 11:30 am

## 2014-05-29 NOTE — Progress Notes (Signed)
Patient is a pleasant 63 y.o. female referred to lipid clinic by Dr. Mare Ferrari due to LDL > 240 mg/dL baseline and had a hard time tolerating lipid lowering meds.  Patient had a h/o TC > 300 and LDL of 246 mg/dL in 08/2012 [facility]).  She was started on pravastatin 40 mg qd a month ago and told to reduce dose if muscle aches occurred.  She developed muscle weakness on pravastatin 40 mg qd, which also occurred when she cut dose down to 20 mg qd, however once she reduced to pravastatin 10 mg qd, she has been able to tolerate medication.  Has been on pravastatin 10 mg qd for past 2 weeks without problems.  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  No corneal arcus seen on examination.   Her EKG recently showed evidence of a questionable old interior wall MI.  No evidence of ischemia found on stress test.  Patient did go to the ER in 2010 with chest pressure, but was apparently not diagnosed with MI at that time.  She tried lipitor in the late 1990's and failed due to severe muscle aches.  Ten years later she was tried on Crestor, however had to stop due to severe muscle aches again.  Between 2011 and 2013 she tried [facility] a few times, however muscle aches and GI discomfort led her to stop this as well.  She has a brother with elevated cholesterol as well, but she's not sure how high.  She had 4 uncles who had MI's in their 60-70's.  Her father died of an MI at 29.  Mother did not have CAD.    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  Patient asked about screening her children today, and I confirmed that cascade screening should be done with simple lipid panel if they haven't had one done recently.   She should be treated aggressively with LDL goal of at least < 100 mg/dL if possible.  RF:  HeFH, LDL > 240 mg/dL at baseline, family h/o CAD, possible old MI on  EKG - LDL goal < 100 mg/dL at least Meds:  Pravastatin 10 mg qd Intolerant:  Crestor, Lipitor, Welchol, Pravastatin 20-40 mg qd (muscle aches)  Labs:   05/2014:  TC 216, TG 245, LDL 160, HDL 46, non-HDL 171, LFTs normal (pravastatin 10 mg qd) 03/2014:  TC 317, TG 357, LDL 204, HDL 41, non-HDL 275, LFT normal (not on lipid lowering therapy) --- likely a falsely low LDL due to TG of 357.  Her non-HDL is 275 mg/dL is very elevated !  Current Outpatient Prescriptions  Medication Sig Dispense Refill  . acyclovir (ZOVIRAX) 200 MG capsule Take 200 mg by mouth 3 (three) times daily. As directed      . atenolol (TENORMIN) 100 MG tablet Take 100 mg by mouth daily.      . clonazePAM (KLONOPIN) 1 MG tablet Take 1 mg by mouth as directed. 1/2-1 tablet at bedtime as needed      . Cranberry 200 MG CAPS Take by mouth daily.      Marland Kitchen FLUTICASONE PROPIONATE  HFA IN Inhale into the lungs. 2 puffs each nostril daily      . Lactobacillus (ACIDOPHILUS) TABS Take by mouth daily.      . Multiple Vitamin (MULTIVITAMIN) capsule Take 1 capsule by mouth daily.      Marland Kitchen  pravastatin (PRAVACHOL) 40 MG tablet Take 10 mg by mouth every evening.      . traZODone (DESYREL) 100 MG tablet Take 100 mg by mouth at bedtime.      . triamterene-hydrochlorothiazide (MAXZIDE-25) 37.5-25 MG per tablet Take 1 tablet by mouth daily.       No current facility-administered medications for this visit.   Allergies  Allergen Reactions  . Biaxin [Clarithromycin]   . Statins     Severe muscle aches with Lipitor (1998) and Crestor (2008)  . Welchol [Colesevelam Hcl]     Side effects - muscle and GI   Family History  Problem Relation Age of Onset  . Hypertension Father   . Breast cancer Maternal Aunt

## 2014-06-03 ENCOUNTER — Telehealth: Payer: Self-pay | Admitting: Cardiology

## 2014-06-03 NOTE — Telephone Encounter (Signed)
New message         Pt needs the enrollment form completed and sent to [phone] for medication Praluent

## 2014-06-04 NOTE — Telephone Encounter (Signed)
Spoke with Faye Ramsay D and he is faxing information

## 2014-06-19 ENCOUNTER — Telehealth: Payer: Self-pay | Admitting: Pharmacist

## 2014-06-19 NOTE — Telephone Encounter (Signed)
Letter of medical necessity faxed to [facility] (on 06/19/14)  to appeal their initial denial of Praluent.  Patient should be placed in the [facility] through MyPraluent soon.

## 2014-06-24 ENCOUNTER — Telehealth: Payer: Self-pay | Admitting: Pharmacist

## 2014-06-24 NOTE — Telephone Encounter (Signed)
Patient has been Approved by [facility] for Praluent.  Reference number [id number].  Effective 06/12/14 - 12/11/14.  Will need to fax in continuation section of PA to [facility] early March. Patient is aware of good news.  She will call us early March to remind [place] to send in continuation section.

## 2014-07-09 ENCOUNTER — Other Ambulatory Visit (INDEPENDENT_AMBULATORY_CARE_PROVIDER_SITE_OTHER): Payer: BC Managed Care – PPO | Admitting: *Deleted

## 2014-07-09 DIAGNOSIS — E7800 Pure hypercholesterolemia, unspecified: Secondary | ICD-10-CM

## 2014-07-09 DIAGNOSIS — Z79899 Other long term (current) drug therapy: Secondary | ICD-10-CM

## 2014-07-09 LAB — LDL CHOLESTEROL, DIRECT: Direct LDL: 89.5 mg/dL

## 2014-07-09 LAB — HEPATIC FUNCTION PANEL
ALBUMIN: 3.4 g/dL — AB (ref 3.5–5.2)
ALK PHOS: 87 U/L (ref 39–117)
ALT: 31 U/L (ref 0–35)
AST: 28 U/L (ref 0–37)
BILIRUBIN DIRECT: 0 mg/dL (ref 0.0–0.3)
BILIRUBIN TOTAL: 0.5 mg/dL (ref 0.2–1.2)
Total Protein: 7.2 g/dL (ref 6.0–8.3)

## 2014-07-09 LAB — LIPID PANEL
Cholesterol: 174 mg/dL (ref 0–200)
HDL: 38.4 mg/dL — ABNORMAL LOW (ref >39.00)
NONHDL: 135.6
TRIGLYCERIDES: 317 mg/dL — AB (ref 0.0–149.0)
Total CHOL/HDL Ratio: 5
VLDL: 63.4 mg/dL — ABNORMAL HIGH (ref 0.0–40.0)

## 2014-07-10 ENCOUNTER — Ambulatory Visit (INDEPENDENT_AMBULATORY_CARE_PROVIDER_SITE_OTHER): Payer: BC Managed Care – PPO | Admitting: Pharmacist

## 2014-07-10 DIAGNOSIS — E7800 Pure hypercholesterolemia, unspecified: Secondary | ICD-10-CM

## 2014-07-10 NOTE — Progress Notes (Signed)
Quick Note:  Please report to patient. The recent labs are stable. LDL much better. She is followed in the lipid clinic. ______

## 2014-07-10 NOTE — Patient Instructions (Signed)
Continue Praluent 75mg  every 2 weeks.   Recheck labs in 3 months.   Please call [name] at [phone] if you have not received your shipment within the next 2 weeks.

## 2014-07-11 NOTE — Assessment & Plan Note (Signed)
Pt's LDL significantly improved with addition of Praluent.  She has had ~50% reduction.  She is below her goal of <100 mg/dL.  She is tolerating therapy with no problems.  Will continue Praluent 75mg  every 2 weeks and follow up in 6 months.  She has been approved through [facility] but has not received her first shipment of medication.  Approval refaxed to MyPraluent.  Pt will call if she has any additional problems receiving therapy.

## 2014-07-11 NOTE — Progress Notes (Signed)
HPI Patient is a pleasant 63 y.o. female referred to lipid clinic by Dr. Mare Ferrari due to LDL > 240 mg/dL baseline and had a hard time tolerating lipid lowering meds.  Patient had a h/o TC > 300 and LDL of 246 mg/dL in 08/2012 [facility]).  She was started on pravastatin 40 mg qd in June and told to reduce dose if muscle aches occurred.  She developed muscle weakness on pravastatin 40 mg qd, which also occurred when she cut dose down to 20 mg qd, she reduced to pravastatin 10 mg qd, she has been able to tolerate medication for a while but has recently had to decrease to 5mg  daily.  At her visit in August, she was started on Praluent 75mg  every 2 weeks.  She has tolerated this with no problems.  She has been approved by her insurance but has not received her shipment yet.   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  She does not notice a difference in these since starting Praluent.  No corneal arcus seen on examination.    PAST THERAPIES: She tried lipitor in the late 1990's and failed due to severe muscle aches.  Ten years later she was tried on Crestor, however had to stop due to severe muscle aches again.  Between 2011 and 2013 she tried [facility] a few times, however muscle aches and GI discomfort led her to stop this as well.    FAMILY HISTORY: She has a brother with elevated cholesterol as well, but she's not sure how high.  She had 4 uncles who had MI's in their 60-70's.  Her father died of an MI at 84.  Mother did not have CAD.    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  .   She should be treated aggressively with LDL goal of at least < 100 mg/dL if possible.  RF:  HeFH, LDL > 240 mg/dL at baseline, family h/o CAD, possible old MI on EKG - LDL goal < 100 mg/dL at least Meds:  Pravastatin 5 mg qd; Praluent 75mg  every 2 weeks Intolerant:  Crestor,  Lipitor, Welchol, Pravastatin 20-40 mg qd (muscle aches)  Labs:   07/2014: TC 174, TG 317, LDL 89 HDL 38, non-HDL 135, LFTS are WNL (pravastatin 5mg  and Praluent 75mg  every 2 weeks) 05/2014:  TC 216, TG 245, LDL 160, HDL 46, non-HDL 171, LFTs normal (pravastatin 10 mg qd) 03/2014:  TC 317, TG 357, LDL 204, HDL 41, non-HDL 275, LFT normal (not on lipid lowering therapy) --- likely a falsely low LDL due to TG of 357.  Her non-HDL is 275 mg/dL is very elevated !  Current Outpatient Prescriptions  Medication Sig Dispense Refill  . acyclovir (ZOVIRAX) 200 MG capsule Take 200 mg by mouth 3 (three) times daily. As directed      . Alirocumab (PRALUENT) 75 MG/ML SOPN Inject 75 mg into the skin every 14 (fourteen) days.  2 pen  11  . atenolol (TENORMIN) 100 MG tablet Take 100 mg by mouth daily.      . clonazePAM (KLONOPIN) 1 MG tablet Take 1 mg by mouth as directed. 1/2-1 tablet at bedtime as needed      . Cranberry 200 MG CAPS Take by mouth daily.      Marland Kitchen FLUTICASONE PROPIONATE  HFA IN Inhale into the lungs. 2 puffs  each nostril daily      . Lactobacillus (ACIDOPHILUS) TABS Take by mouth daily.      . Multiple Vitamin (MULTIVITAMIN) capsule Take 1 capsule by mouth daily.      . pravastatin (PRAVACHOL) 10 MG tablet Take 1 tablet (10 mg total) by mouth daily.  30 tablet  5  . traZODone (DESYREL) 100 MG tablet Take 100 mg by mouth at bedtime.      . triamterene-hydrochlorothiazide (MAXZIDE-25) 37.5-25 MG per tablet Take 1 tablet by mouth daily.       No current facility-administered medications for this visit.   Allergies  Allergen Reactions  . Biaxin [Clarithromycin]   . Statins     Severe muscle aches with Lipitor (1998) and Crestor (2008)  . Welchol [Colesevelam Hcl]     Side effects - muscle and GI   Family History  Problem Relation Age of Onset  . Hypertension Father   . Breast cancer Maternal Aunt

## 2014-07-22 ENCOUNTER — Telehealth: Payer: Self-pay | Admitting: Pharmacist

## 2014-07-22 NOTE — Telephone Encounter (Signed)
New message     Talk to [name] about cholesterol medication [name] prescribed.  She has questions about the cost.

## 2014-07-25 NOTE — Telephone Encounter (Signed)
Spoke with pt on 10/21.  She is having problems with her copay being >$200.  She should qualify for the discount card.  Will discuss with company to determine why this was not used.

## 2014-07-29 NOTE — Telephone Encounter (Signed)
[facility] with [facility].  Copay card activated and her cost will be $0.  She is aware and expecting a call from [facility] to schedule shipment.

## 2014-08-04 ENCOUNTER — Encounter: Payer: Self-pay | Admitting: Cardiology

## 2014-09-25 ENCOUNTER — Other Ambulatory Visit: Payer: Self-pay | Admitting: Cardiology

## 2014-10-15 ENCOUNTER — Other Ambulatory Visit (INDEPENDENT_AMBULATORY_CARE_PROVIDER_SITE_OTHER): Payer: Self-pay | Admitting: *Deleted

## 2014-10-15 DIAGNOSIS — E7800 Pure hypercholesterolemia, unspecified: Secondary | ICD-10-CM

## 2014-10-15 LAB — HEPATIC FUNCTION PANEL
ALK PHOS: 93 U/L (ref 39–117)
ALT: 41 U/L — AB (ref 0–35)
AST: 38 U/L — ABNORMAL HIGH (ref 0–37)
Albumin: 3.7 g/dL (ref 3.5–5.2)
BILIRUBIN TOTAL: 0.3 mg/dL (ref 0.2–1.2)
Bilirubin, Direct: 0 mg/dL (ref 0.0–0.3)
TOTAL PROTEIN: 6.8 g/dL (ref 6.0–8.3)

## 2014-10-15 LAB — LIPID PANEL
CHOL/HDL RATIO: 3
CHOLESTEROL: 154 mg/dL (ref 0–200)
HDL: 44.5 mg/dL (ref >39.00)
NONHDL: 109.5
TRIGLYCERIDES: 204 mg/dL — AB (ref 0.0–149.0)
VLDL: 40.8 mg/dL — ABNORMAL HIGH (ref 0.0–40.0)

## 2014-10-15 LAB — LDL CHOLESTEROL, DIRECT: Direct LDL: 86 mg/dL

## 2014-10-16 ENCOUNTER — Encounter: Payer: Self-pay | Admitting: Pharmacist

## 2014-10-16 ENCOUNTER — Ambulatory Visit (INDEPENDENT_AMBULATORY_CARE_PROVIDER_SITE_OTHER): Payer: Self-pay | Admitting: Pharmacist

## 2014-10-16 VITALS — Wt 232.7 lb

## 2014-10-16 DIAGNOSIS — E7800 Pure hypercholesterolemia, unspecified: Secondary | ICD-10-CM

## 2014-10-16 NOTE — Progress Notes (Signed)
HPI Patient is a pleasant 64 y.o. female referred to lipid clinic by Dr. Mare Ferrari due to LDL > 240 mg/dL baseline and had a hard time tolerating lipid lowering meds. Pt is here today for a 41mo follow-up. Patient had a h/o TC > 300 and LDL of 246 mg/dL in 08/2012 [facility]).  She was started on pravastatin 40mg  daily in 03/2014 and told to reduce dose if muscle aches occurred.  She developed muscle weakness on pravastatin 40 mg daily, which also occurred when she cut dose down to 20 mg daily, she reduced to pravastatin 10 mg daily, she has been able to tolerate medication for a while but later had to decrease to 5mg  daily.  At her visit in 05/2014, she was started on Praluent 75mg  every 2 weeks.  She has tolerated this with no problems.  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  She does not notice a difference in these since starting Praluent.  Pt denies any significant changes in diet or exercise over the past several months, but reports decreased stress d/t job/responsibility changes.  PAST THERAPIES: She tried lipitor in the late 1990's and failed due to severe muscle aches.  Ten years later she was tried on Crestor, however had to stop due to severe muscle aches again.  Between 2011 and 2013 she tried [facility] a few times, however muscle aches and GI discomfort led her to stop this as well.    FAMILY HISTORY: She has a brother with elevated cholesterol as well, but she's not sure how high.  She had 4 uncles who had MI's in their 60-70's.  Her father died of an MI at 35.  Mother did not have CAD.    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  She should be treated aggressively with LDL goal of at least < 100 mg/dL if possible.  RF:  HeFH, LDL > 240 mg/dL at baseline, family h/o CAD, possible old MI on EKG - LDL goal < 100 mg/dL at least Meds:  Pravastatin  5 mg QHS; Praluent 75mg  every 2 weeks Intolerant:  Crestor, Lipitor, Welchol, Pravastatin 20-40mg  (muscle aches)  Labs: 10/2014: TC 154, TG 204, LDL 86, HDL 44, non-HDL 109, LFTs trending up (pravastin 5mg  QHS, Praluent 75mg  every 2 weeks) 07/2014: TC 174, TG 317, LDL 89, HDL 38, non-HDL 135, LFTS are WNL (pravastatin 5mg  and Praluent 75mg  every 2 weeks) 05/2014:  TC 216, TG 245, LDL 160, HDL 46, non-HDL 171, LFTs normal (pravastatin 10 mg qd) 03/2014:  TC 317, TG 357, LDL 204, HDL 41, non-HDL 275, LFT normal (not on lipid lowering therapy) --- likely a falsely low LDL due to TG of 357.  Her non-HDL is 275 mg/dL is very elevated!  Current Outpatient Prescriptions  Medication Sig Dispense Refill  . acyclovir (ZOVIRAX) 200 MG capsule Take 200 mg by mouth 3 (three) times daily. As directed    . Alirocumab (PRALUENT) 75 MG/ML SOPN Inject 75 mg into the skin every 14 (fourteen) days. 2 pen 11  . atenolol (TENORMIN) 100 MG tablet Take 100 mg by mouth daily.    . clonazePAM (KLONOPIN) 1 MG tablet Take 1 mg by mouth as directed. 1/2-1 tablet at bedtime as needed    . Cranberry 200 MG CAPS Take by mouth daily.    Marland Kitchen FLUTICASONE PROPIONATE  HFA IN  Inhale into the lungs. 2 puffs each nostril daily    . Lactobacillus (ACIDOPHILUS) TABS Take by mouth daily.    . Multiple Vitamin (MULTIVITAMIN) capsule Take 1 capsule by mouth daily.    . pravastatin (PRAVACHOL) 10 MG tablet Take 1 tablet (10 mg total) by mouth daily. 30 tablet 5  . pravastatin (PRAVACHOL) 40 MG tablet TAKE 1 TABLET (40 MG TOTAL) BY MOUTH EVERY EVENING. 30 tablet 3  . traZODone (DESYREL) 100 MG tablet Take 100 mg by mouth at bedtime.    . triamterene-hydrochlorothiazide (MAXZIDE-25) 37.5-25 MG per tablet Take 1 tablet by mouth daily.     No current facility-administered medications for this visit.   Allergies  Allergen Reactions  . Biaxin [Clarithromycin]   . Statins     Severe muscle aches with Lipitor (1998) and Crestor (2008)  .  Welchol [Colesevelam Hcl]     Side effects - muscle and GI   Family History  Problem Relation Age of Onset  . Hypertension Father   . Breast cancer Maternal Aunt     A/P: Pt has been tolerating current regimen of pravastin 5mg  QHS and Praluent 75mg  SubQ every 2 weeks.  Her lipid panel looks great and pt is at LDL goal < 100 and non-HDL goal of < 130.  LFTs are slowing trending up since beginning Praluent, will continue to monitor.  Pt will be switching to a Medicare Part D plan in 12/2014.  She has been instructed to notify us of the specific plan and effective date so we can address insurance plan change with MyPraluent.  Depending on plan, pt may 1) be covered but hit donut hole very quickly, making other meds full out-of-pocket price 2) be required to switch to Repatha or 3) not be covered and pt will be enrolled in MyPraluent patient assistance program.  -Continue pravastatin 5mg  QHS -Continue Praluent 75mg  SubQ every 2 weeks -Check FLP and LFTs in 71mo -F/U in lipid clinic 1 week after checking labs - if labs look okay, may cancel appointment and simply continue current regimen  Drucie Opitz, PharmD Clinical Pharmacy Resident Pager: [phone]

## 2014-10-16 NOTE — Patient Instructions (Signed)
It was a pleasure to meet you today.  Continue taking pravastatin 5mg  each night at bedtime Continue taking Praluent 75mg  subcutaneously every 2 weeks Check fasting cholesterol panel and liver function in 6 months Follow-up in lipid clinic 1 week after checking labs  Call us and let a pharmacist know what your insurance coverage will be changing to and when it will be taking effect. [phone]

## 2015-01-22 ENCOUNTER — Other Ambulatory Visit: Payer: Self-pay | Admitting: Cardiology

## 2015-01-23 NOTE — Telephone Encounter (Signed)
Should this patient be on pravastatin 5mg ? The pharmacy is requesting 40mg . Please advise. Thanks, MI

## 2015-01-27 ENCOUNTER — Other Ambulatory Visit: Payer: Self-pay | Admitting: Gastroenterology

## 2015-01-27 DIAGNOSIS — Z8371 Family history of colonic polyps: Secondary | ICD-10-CM | POA: Diagnosis not present

## 2015-01-27 DIAGNOSIS — K5730 Diverticulosis of large intestine without perforation or abscess without bleeding: Secondary | ICD-10-CM | POA: Diagnosis not present

## 2015-01-27 DIAGNOSIS — K644 Residual hemorrhoidal skin tags: Secondary | ICD-10-CM | POA: Diagnosis not present

## 2015-01-27 DIAGNOSIS — D123 Benign neoplasm of transverse colon: Secondary | ICD-10-CM | POA: Diagnosis not present

## 2015-01-27 DIAGNOSIS — Z1211 Encounter for screening for malignant neoplasm of colon: Secondary | ICD-10-CM | POA: Diagnosis not present

## 2015-04-13 ENCOUNTER — Other Ambulatory Visit: Payer: Self-pay

## 2015-04-16 ENCOUNTER — Ambulatory Visit: Payer: Self-pay | Admitting: Pharmacist

## 2015-05-11 DIAGNOSIS — Z1231 Encounter for screening mammogram for malignant neoplasm of breast: Secondary | ICD-10-CM | POA: Diagnosis not present

## 2015-05-11 DIAGNOSIS — Z803 Family history of malignant neoplasm of breast: Secondary | ICD-10-CM | POA: Diagnosis not present

## 2015-06-25 ENCOUNTER — Telehealth: Payer: Self-pay | Admitting: Pharmacist

## 2015-06-25 DIAGNOSIS — E7800 Pure hypercholesterolemia, unspecified: Secondary | ICD-10-CM

## 2015-06-25 MED ORDER — ALIROCUMAB 75 MG/ML ~~LOC~~ SOPN: 75.0000 mg | PEN_INJECTOR | SUBCUTANEOUS | Status: DC

## 2015-06-25 NOTE — Telephone Encounter (Signed)
Contacted patient regarding Praluent. Patient's insurance changed from [facility] to [facility] in April 2016. She reports that she has not been on drug since her insurance plan changed. Patient was approved for Praluent coverage - will [facility]. Set patient up with lab work in 2 months.

## 2015-07-13 DIAGNOSIS — Z23 Encounter for immunization: Secondary | ICD-10-CM | POA: Diagnosis not present

## 2015-08-03 DIAGNOSIS — Z23 Encounter for immunization: Secondary | ICD-10-CM | POA: Diagnosis not present

## 2015-09-04 DIAGNOSIS — R319 Hematuria, unspecified: Secondary | ICD-10-CM | POA: Diagnosis not present

## 2015-09-04 DIAGNOSIS — R1084 Generalized abdominal pain: Secondary | ICD-10-CM | POA: Diagnosis not present

## 2015-09-04 DIAGNOSIS — R8299 Other abnormal findings in urine: Secondary | ICD-10-CM | POA: Diagnosis not present

## 2015-09-09 DIAGNOSIS — R319 Hematuria, unspecified: Secondary | ICD-10-CM | POA: Diagnosis not present

## 2015-09-14 ENCOUNTER — Other Ambulatory Visit (INDEPENDENT_AMBULATORY_CARE_PROVIDER_SITE_OTHER): Payer: Medicare Other | Admitting: *Deleted

## 2015-09-14 DIAGNOSIS — E7800 Pure hypercholesterolemia, unspecified: Secondary | ICD-10-CM

## 2015-09-14 LAB — HEPATIC FUNCTION PANEL
ALK PHOS: 75 U/L (ref 33–130)
ALT: 23 U/L (ref 6–29)
AST: 21 U/L (ref 10–35)
Albumin: 3.8 g/dL (ref 3.6–5.1)
BILIRUBIN DIRECT: 0.1 mg/dL (ref <=0.2)
BILIRUBIN INDIRECT: 0.2 mg/dL (ref 0.2–1.2)
Total Bilirubin: 0.3 mg/dL (ref 0.2–1.2)
Total Protein: 6.6 g/dL (ref 6.1–8.1)

## 2015-09-14 LAB — LIPID PANEL
CHOL/HDL RATIO: 3.7 ratio (ref <=5.0)
CHOLESTEROL: 154 mg/dL (ref 125–200)
HDL: 42 mg/dL — ABNORMAL LOW (ref >=46)
LDL CALC: 80 mg/dL (ref <130)
Triglycerides: 162 mg/dL — ABNORMAL HIGH (ref <150)
VLDL: 32 mg/dL — AB (ref <30)

## 2015-09-15 ENCOUNTER — Emergency Department (HOSPITAL_BASED_OUTPATIENT_CLINIC_OR_DEPARTMENT_OTHER): Payer: Medicare Other

## 2015-09-15 ENCOUNTER — Encounter (HOSPITAL_BASED_OUTPATIENT_CLINIC_OR_DEPARTMENT_OTHER): Payer: Self-pay | Admitting: Emergency Medicine

## 2015-09-15 ENCOUNTER — Emergency Department (HOSPITAL_BASED_OUTPATIENT_CLINIC_OR_DEPARTMENT_OTHER)
Admission: EM | Admit: 2015-09-15 | Discharge: 2015-09-16 | Disposition: A | Discharge status: Home / Self Care | Payer: Medicare Other | Attending: Emergency Medicine | Admitting: Emergency Medicine

## 2015-09-15 DIAGNOSIS — Z79899 Other long term (current) drug therapy: Secondary | ICD-10-CM | POA: Diagnosis not present

## 2015-09-15 DIAGNOSIS — Z7951 Long term (current) use of inhaled steroids: Secondary | ICD-10-CM | POA: Insufficient documentation

## 2015-09-15 DIAGNOSIS — N1330 Unspecified hydronephrosis: Secondary | ICD-10-CM

## 2015-09-15 DIAGNOSIS — R1031 Right lower quadrant pain: Secondary | ICD-10-CM | POA: Diagnosis not present

## 2015-09-15 DIAGNOSIS — N132 Hydronephrosis with renal and ureteral calculous obstruction: Secondary | ICD-10-CM | POA: Diagnosis not present

## 2015-09-15 DIAGNOSIS — I10 Essential (primary) hypertension: Secondary | ICD-10-CM | POA: Insufficient documentation

## 2015-09-15 DIAGNOSIS — N390 Urinary tract infection, site not specified: Secondary | ICD-10-CM

## 2015-09-15 DIAGNOSIS — Z87442 Personal history of urinary calculi: Secondary | ICD-10-CM | POA: Diagnosis not present

## 2015-09-15 DIAGNOSIS — R109 Unspecified abdominal pain: Secondary | ICD-10-CM | POA: Diagnosis not present

## 2015-09-15 HISTORY — DX: Calculus of kidney: N20.0

## 2015-09-15 LAB — COMPREHENSIVE METABOLIC PANEL
ALT: 24 U/L (ref 14–54)
ANION GAP: 10 (ref 5–15)
AST: 24 U/L (ref 15–41)
Albumin: 4.1 g/dL (ref 3.5–5.0)
Alkaline Phosphatase: 85 U/L (ref 38–126)
BUN: 23 mg/dL — ABNORMAL HIGH (ref 6–20)
CHLORIDE: 99 mmol/L — AB (ref 101–111)
CO2: 27 mmol/L (ref 22–32)
Calcium: 9.1 mg/dL (ref 8.9–10.3)
Creatinine, Ser: 0.98 mg/dL (ref 0.44–1.00)
GFR calc non Af Amer: 59 mL/min — ABNORMAL LOW (ref >60)
Glucose, Bld: 100 mg/dL — ABNORMAL HIGH (ref 65–99)
Potassium: 3 mmol/L — ABNORMAL LOW (ref 3.5–5.1)
SODIUM: 136 mmol/L (ref 135–145)
Total Bilirubin: 0.6 mg/dL (ref 0.3–1.2)
Total Protein: 7.4 g/dL (ref 6.5–8.1)

## 2015-09-15 LAB — CBC WITH DIFFERENTIAL/PLATELET
Basophils Absolute: 0 10*3/uL (ref 0.0–0.1)
Basophils Relative: 0 %
EOS ABS: 0.1 10*3/uL (ref 0.0–0.7)
EOS PCT: 2 %
HCT: 42.3 % (ref 36.0–46.0)
Hemoglobin: 14.1 g/dL (ref 12.0–15.0)
LYMPHS ABS: 1.4 10*3/uL (ref 0.7–4.0)
Lymphocytes Relative: 18 %
MCH: 29.7 pg (ref 26.0–34.0)
MCHC: 33.3 g/dL (ref 30.0–36.0)
MCV: 89.2 fL (ref 78.0–100.0)
MONOS PCT: 8 %
Monocytes Absolute: 0.6 10*3/uL (ref 0.1–1.0)
Neutro Abs: 5.7 10*3/uL (ref 1.7–7.7)
Neutrophils Relative %: 72 %
PLATELETS: 192 10*3/uL (ref 150–400)
RBC: 4.74 MIL/uL (ref 3.87–5.11)
RDW: 14.4 % (ref 11.5–15.5)
WBC: 7.8 10*3/uL (ref 4.0–10.5)

## 2015-09-15 LAB — URINALYSIS, ROUTINE W REFLEX MICROSCOPIC
Glucose, UA: NEGATIVE mg/dL
KETONES UR: 15 mg/dL — AB
NITRITE: NEGATIVE
PH: 5 (ref 5.0–8.0)
Protein, ur: 100 mg/dL — AB
Specific Gravity, Urine: 1.034 — ABNORMAL HIGH (ref 1.005–1.030)

## 2015-09-15 LAB — URINE MICROSCOPIC-ADD ON

## 2015-09-15 MED ORDER — ONDANSETRON HCL 4 MG/2ML IJ SOLN
4.0000 mg | Freq: Once | INTRAMUSCULAR | Status: AC
Start: 2015-09-15 — End: 2015-09-15
  Administered 2015-09-15: 4 mg via INTRAVENOUS
  Filled 2015-09-15: qty 2

## 2015-09-15 MED ORDER — HYDROMORPHONE HCL 1 MG/ML IJ SOLN
1.0000 mg | Freq: Once | INTRAMUSCULAR | Status: AC
  Administered 2015-09-15: 1 mg via INTRAVENOUS
  Filled 2015-09-15: qty 1

## 2015-09-15 MED ORDER — SODIUM CHLORIDE 0.9 % IV BOLUS (SEPSIS)
1000.0000 mL | Freq: Once | INTRAVENOUS | Status: AC
  Administered 2015-09-15: 1000 mL via INTRAVENOUS

## 2015-09-15 NOTE — ED Notes (Addendum)
Patient states that she has pain to her right flank area. History of multiple kidney stones in the past  History of 2 weeks of blood in her urine. The patient reports that she went to her PMD and they gave her a shot of tordal and was sent here for evaluation of a possible blockage.

## 2015-09-15 NOTE — ED Provider Notes (Addendum)
CSN: [id number]     Arrival date & time 09/15/15  2114 History  By signing my name below, I, [name], attest that this documentation has been prepared under the direction and in the presence of No att. providers found. Electronically Signed: [name], ED Scribe. 09/16/2015. 12:50 PM.    Chief Complaint  Patient presents with  . Flank Pain   The history is provided by the patient. No language interpreter was used.    HPI Comments: Lindsey Jordan is a 64 y.o. female with a h/o HTN and kidney stones who presents to the Emergency Department complaining of constant, moderate, right flank pain onset 4 hours. Pt has a h/o multiple kidney stones. She denies any h/o kidney stone procedures. She reports associated dysuria for 4 hours, hematuria for 2 weeks and nausea. She states that pain is exacerbated with certain movement. Pt denies any alleviating factors. Pt notes she went to her PCP today and was given a shot a Toradol and was sent to the ER for further evaluation. She was given Cipro 1.5 weeks ago for UTI prophylaxis and she has been complaint and completed the medication. She denies any fever, vomiting, diarrhea, or constipation.   Past Medical History  Diagnosis Date  . Hypertension   . Kidney stones    Past Surgical History  Procedure Laterality Date  . Abdominal hysterectomy  2003  . Ectopic pregnancy surgery    . Breast surgery    . Tonsillectomy    . Cesarean section      one previous  . Adenoidectomy      age 31  . Hemorrhoid surgery      in 20's  . Breast lumpectomy      x 2 in her 20's   Family History  Problem Relation Age of Onset  . Hypertension Father   . Breast cancer Maternal Aunt    Social History  Substance Use Topics  . Smoking status: Never Smoker   . Smokeless tobacco: Never Used  . Alcohol Use: No   OB History    Gravida Para Term Preterm AB TAB SAB Ectopic Multiple Living   4 1 1  3  2 1  1      Review of Systems  Constitutional:  Negative for fever.  HENT: Negative for sore throat.   Eyes: Negative for visual disturbance.  Respiratory: Negative for cough and shortness of breath.   Cardiovascular: Negative for chest pain.  Gastrointestinal: Positive for nausea. Negative for vomiting, abdominal pain, diarrhea and constipation.  Genitourinary: Positive for dysuria, hematuria and flank pain. Negative for difficulty urinating.  Musculoskeletal: Negative for back pain and neck pain.  Skin: Negative for rash.  Neurological: Negative for syncope and headaches.      Allergies  Biaxin; Statins; and Welchol  Home Medications   Prior to Admission medications   Medication Sig Start Date End Date Taking? Authorizing Provider  acyclovir (ZOVIRAX) 200 MG capsule Take 200 mg by mouth 3 (three) times daily. As directed    Historical Provider, MD  Alirocumab (PRALUENT) 75 MG/ML SOPN Inject 75 mg into the skin every 14 (fourteen) days. 06/25/15   Darlin Coco, MD  atenolol (TENORMIN) 100 MG tablet Take 100 mg by mouth daily.    Historical Provider, MD  cephALEXin (KEFLEX) 500 MG capsule Take 1 capsule (500 mg total) by mouth 4 (four) times daily. 09/16/15 09/30/15  Gareth Morgan, MD  clonazePAM (KLONOPIN) 1 MG tablet Take 1 mg by mouth as directed. 1/2-1  tablet at bedtime as needed    Historical Provider, MD  Cranberry 200 MG CAPS Take by mouth daily.    Historical Provider, MD  FLUTICASONE PROPIONATE  HFA IN Inhale into the lungs. 2 puffs each nostril daily    Historical Provider, MD  HYDROcodone-acetaminophen (NORCO/VICODIN) 5-325 MG tablet Take 1 tablet by mouth every 4 (four) hours as needed. 09/16/15   Gareth Morgan, MD  Lactobacillus (ACIDOPHILUS) TABS Take by mouth daily.    Historical Provider, MD  Multiple Vitamin (MULTIVITAMIN) capsule Take 1 capsule by mouth daily.    Historical Provider, MD  naproxen (NAPROSYN) 500 MG tablet Take 1 tablet (500 mg total) by mouth 2 (two) times daily. 09/16/15   Gareth Morgan,  MD  ondansetron (ZOFRAN ODT) 4 MG disintegrating tablet Take 1 tablet (4 mg total) by mouth every 8 (eight) hours as needed for nausea or vomiting. 09/16/15   Gareth Morgan, MD  pravastatin (PRAVACHOL) 10 MG tablet Take 1 tablet (10 mg total) by mouth daily. Patient taking differently: Take 5 mg by mouth daily.  05/29/14   Darlin Coco, MD  tamsulosin (FLOMAX) 0.4 MG CAPS capsule Take 1 capsule (0.4 mg total) by mouth daily. 09/16/15 10/07/15  Gareth Morgan, MD  traZODone (DESYREL) 100 MG tablet Take 100 mg by mouth at bedtime.    Historical Provider, MD  triamterene-hydrochlorothiazide (MAXZIDE-25) 37.5-25 MG per tablet Take 1 tablet by mouth daily.    Historical Provider, MD   Triage Vitals: BP 155/87 mmHg  Pulse 63  Temp(Src) 97.4 F (36.3 C) (Oral)  Resp 18  Ht 5\' 7"  (1.702 m)  Wt 225 lb (102.059 kg)  BMI 35.23 kg/m2  SpO2 100%  Physical Exam  Constitutional: She is oriented to person, place, and time. She appears well-developed and well-nourished. No distress.  HENT:  Head: Normocephalic and atraumatic.  Eyes: Conjunctivae and EOM are normal.  Neck: Neck supple. No tracheal deviation present.  Cardiovascular: Normal rate.   Pulmonary/Chest: Effort normal. No respiratory distress.  Abdominal: There is tenderness. There is no CVA tenderness, no tenderness at McBurney's point and negative Murphy's sign.  Tenderness over right lateral abdomen  Musculoskeletal: Normal range of motion.  Lower right sided lumbar tenderness  Neurological: She is alert and oriented to person, place, and time.  Skin: Skin is warm and dry.  Psychiatric: She has a normal mood and affect. Her behavior is normal.  Nursing note and vitals reviewed.   ED Course  Procedures (including critical care time)   DIAGNOSTIC STUDIES: Oxygen Saturation is 100% on RA, normal by my interpretation.    COORDINATION OF CARE: 10:38 PM- Will order CT renal stone study, CBC w/diff, CMP, UA, Zofran, Dilaudid and  IV fluids.  Pt advised of plan for treatment and pt agrees.   Labs Review Labs Reviewed  URINALYSIS, ROUTINE W REFLEX MICROSCOPIC (NOT AT [facility]) - Abnormal; Notable for the following:    Color, Urine RED (*)    APPearance TURBID (*)    Specific Gravity, Urine 1.034 (*)    Hgb urine dipstick LARGE (*)    Bilirubin Urine LARGE (*)    Ketones, ur 15 (*)    Protein, ur 100 (*)    Leukocytes, UA SMALL (*)    All other components within normal limits  URINE MICROSCOPIC-ADD ON - Abnormal; Notable for the following:    Squamous Epithelial / LPF 6-30 (*)    Bacteria, UA FEW (*)    All other components within normal limits  COMPREHENSIVE METABOLIC  PANEL - Abnormal; Notable for the following:    Potassium 3.0 (*)    Chloride 99 (*)    Glucose, Bld 100 (*)    BUN 23 (*)    GFR calc non Af Amer 59 (*)    All other components within normal limits  URINE CULTURE  CBC WITH DIFFERENTIAL/PLATELET    Imaging Review Ct Renal Stone Study  09/15/2015  CLINICAL DATA:  Subacute onset of right lower quadrant and right flank pain. Hematuria. Initial encounter. EXAM: CT ABDOMEN AND PELVIS WITHOUT CONTRAST TECHNIQUE: Multidetector CT imaging of the abdomen and pelvis was performed following the standard protocol without IV contrast. COMPARISON:  None. FINDINGS: Mild bibasilar atelectasis is noted. The liver appears enlarged, measuring 22.4 cm in length. There is diffuse fatty infiltration within the liver, with areas of sparing. The spleen is unremarkable in appearance. The gallbladder is within normal limits. The pancreas and adrenal glands are unremarkable. Mild right-sided hydronephrosis is noted, with right-sided perinephric stranding and fluid, and prominence of the right ureter along its entire course. An obstructing 5 mm stone is noted distally at the right vesicoureteral junction. A 3 mm nonobstructing stone is noted at the upper pole of the left kidney. No free fluid is identified. The small bowel is  unremarkable in appearance. The stomach is within normal limits. No acute vascular abnormalities are seen. Mild scattered calcification is noted along the abdominal aorta and its branches. The appendix is grossly unremarkable, though difficult to fully assess. The descending colon is somewhat redundant. The colon is unremarkable in appearance. The bladder is decompressed and not well assessed. The patient is status post hysterectomy. No suspicious adnexal masses are seen. No inguinal lymphadenopathy is seen. No acute osseous abnormalities are identified. IMPRESSION: 1. Mild right-sided hydronephrosis, with right-sided perinephric stranding and fluid, and diffuse prominence of the right ureter. Obstructing 5 mm stone noted distally at the right vesicoureteral junction. 2. 3 mm nonobstructing stone at the upper pole of the left kidney. 3. Hepatomegaly noted, with diffuse fatty infiltration within the liver. 4. Mild scattered calcification along the abdominal aorta and its branches. 5. Mild bibasilar atelectasis noted Electronically Signed   By: Garald Balding M.D.   On: 09/15/2015 23:28     I have personally reviewed and evaluated these images and lab results as part of my medical decision-making.   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 Pt with hypokalemia and was given 30meq oral K.  CT showed 73mm obstructing UVJ stone with mild right hydronephrosis and perinephric stranding.  Urinalysis difficult to interpret for infxn, however given dysuria and 6-30WBC will send for culture and treat for UTI. No sign of sepsis.  GI[facility] rx for flomax, norco, naproxen, zofran, keflex for nephrolithiasis with UTI.  Patient discharged in stable condition with  understanding of reasons to return.   I personally performed the services described in this documentation, which was scribed in my presence. The recorded information has been reviewed and is accurate.     Gareth Morgan, MD 09/16/15 1251

## 2015-09-16 DIAGNOSIS — N390 Urinary tract infection, site not specified: Secondary | ICD-10-CM | POA: Diagnosis not present

## 2015-09-16 MED ORDER — ONDANSETRON 4 MG PO TBDP: 4.0000 mg | ORAL_TABLET | Freq: Three times a day (TID) | ORAL | Status: DC | PRN

## 2015-09-16 MED ORDER — CEPHALEXIN 500 MG PO CAPS: 500.0000 mg | ORAL_CAPSULE | Freq: Four times a day (QID) | ORAL | Status: AC

## 2015-09-16 MED ORDER — TAMSULOSIN HCL 0.4 MG PO CAPS: 0.4000 mg | ORAL_CAPSULE | Freq: Every day | ORAL | Status: AC

## 2015-09-16 MED ORDER — POTASSIUM CHLORIDE CRYS ER 20 MEQ PO TBCR
40.0000 meq | EXTENDED_RELEASE_TABLET | Freq: Once | ORAL | Status: AC
  Administered 2015-09-16: 40 meq via ORAL
  Filled 2015-09-16: qty 2

## 2015-09-16 MED ORDER — HYDROCODONE-ACETAMINOPHEN 5-325 MG PO TABS: 1.0000 | ORAL_TABLET | ORAL | Status: DC | PRN

## 2015-09-16 MED ORDER — NAPROXEN 500 MG PO TABS: 500.0000 mg | ORAL_TABLET | Freq: Two times a day (BID) | ORAL | Status: DC

## 2015-09-16 NOTE — Progress Notes (Signed)
Quick Note:  Please report to patient. The recent labs are stable. Continue same medication and careful diet. Followed in lipid clinic. ______

## 2015-09-18 DIAGNOSIS — R310 Gross hematuria: Secondary | ICD-10-CM | POA: Diagnosis not present

## 2015-09-18 DIAGNOSIS — N202 Calculus of kidney with calculus of ureter: Secondary | ICD-10-CM | POA: Diagnosis not present

## 2015-09-18 DIAGNOSIS — N1330 Unspecified hydronephrosis: Secondary | ICD-10-CM | POA: Diagnosis not present

## 2015-10-07 DIAGNOSIS — N201 Calculus of ureter: Secondary | ICD-10-CM | POA: Diagnosis not present

## 2015-10-07 DIAGNOSIS — Z0000 Encounter for general adult medical examination without abnormal findings: Secondary | ICD-10-CM | POA: Diagnosis not present

## 2015-11-19 DIAGNOSIS — G4700 Insomnia, unspecified: Secondary | ICD-10-CM | POA: Diagnosis not present

## 2015-11-19 DIAGNOSIS — I10 Essential (primary) hypertension: Secondary | ICD-10-CM | POA: Diagnosis not present

## 2015-12-03 DIAGNOSIS — H2513 Age-related nuclear cataract, bilateral: Secondary | ICD-10-CM | POA: Diagnosis not present

## 2015-12-03 DIAGNOSIS — H5213 Myopia, bilateral: Secondary | ICD-10-CM | POA: Diagnosis not present

## 2016-02-24 DIAGNOSIS — L853 Xerosis cutis: Secondary | ICD-10-CM | POA: Diagnosis not present

## 2016-02-24 DIAGNOSIS — R5383 Other fatigue: Secondary | ICD-10-CM | POA: Diagnosis not present

## 2016-02-24 DIAGNOSIS — F419 Anxiety disorder, unspecified: Secondary | ICD-10-CM | POA: Diagnosis not present

## 2016-02-24 DIAGNOSIS — E7800 Pure hypercholesterolemia, unspecified: Secondary | ICD-10-CM | POA: Diagnosis not present

## 2016-02-24 DIAGNOSIS — Z1159 Encounter for screening for other viral diseases: Secondary | ICD-10-CM | POA: Diagnosis not present

## 2016-02-24 DIAGNOSIS — G4700 Insomnia, unspecified: Secondary | ICD-10-CM | POA: Diagnosis not present

## 2016-02-24 DIAGNOSIS — M25562 Pain in left knee: Secondary | ICD-10-CM | POA: Diagnosis not present

## 2016-02-24 DIAGNOSIS — I10 Essential (primary) hypertension: Secondary | ICD-10-CM | POA: Diagnosis not present

## 2016-02-24 DIAGNOSIS — Z0000 Encounter for general adult medical examination without abnormal findings: Secondary | ICD-10-CM | POA: Diagnosis not present

## 2016-03-01 ENCOUNTER — Telehealth: Payer: Self-pay | Admitting: Pharmacist

## 2016-03-01 DIAGNOSIS — M25562 Pain in left knee: Secondary | ICD-10-CM | POA: Diagnosis not present

## 2016-03-01 DIAGNOSIS — M25561 Pain in right knee: Secondary | ICD-10-CM | POA: Diagnosis not present

## 2016-03-01 DIAGNOSIS — G8929 Other chronic pain: Secondary | ICD-10-CM | POA: Diagnosis not present

## 2016-03-01 NOTE — Telephone Encounter (Signed)
Pt called because she has not been taking her Praluent and had recent labwork done with her PCP who recommended she restart it. Previous PA still on file, patient's copay is $410 per month. This is too expensive for pt to afford. She is interested in research trials. Will forward her information to research group with [facility] for upcoming CLEAR study.

## 2016-03-02 NOTE — Telephone Encounter (Signed)
Received lipid panel from [facility] - Dr. Alben Spittle. Since discontinuing Praluent due to cost, TC 322, TG 231, HDL 52, LDL 224. Scanning in to Epic.

## 2016-04-27 DIAGNOSIS — M25561 Pain in right knee: Secondary | ICD-10-CM | POA: Diagnosis not present

## 2016-04-27 DIAGNOSIS — M25562 Pain in left knee: Secondary | ICD-10-CM | POA: Diagnosis not present

## 2016-04-27 DIAGNOSIS — M1711 Unilateral primary osteoarthritis, right knee: Secondary | ICD-10-CM | POA: Diagnosis not present

## 2016-04-27 DIAGNOSIS — G8929 Other chronic pain: Secondary | ICD-10-CM | POA: Diagnosis not present

## 2016-05-09 ENCOUNTER — Other Ambulatory Visit: Payer: Self-pay | Admitting: Pharmacist

## 2016-05-16 DIAGNOSIS — Z1231 Encounter for screening mammogram for malignant neoplasm of breast: Secondary | ICD-10-CM | POA: Diagnosis not present

## 2016-05-20 ENCOUNTER — Encounter: Payer: Self-pay | Admitting: Cardiology

## 2016-05-25 ENCOUNTER — Encounter: Payer: Self-pay | Admitting: Cardiology

## 2016-06-08 ENCOUNTER — Encounter: Payer: Self-pay | Admitting: Cardiology

## 2016-06-17 ENCOUNTER — Ambulatory Visit: Payer: Medicare Other | Admitting: Cardiology

## 2016-06-28 ENCOUNTER — Telehealth: Payer: Self-pay | Admitting: Cardiology

## 2016-06-28 NOTE — Telephone Encounter (Deleted)
error 

## 2016-08-04 ENCOUNTER — Encounter: Payer: Self-pay | Admitting: Cardiology

## 2016-08-04 ENCOUNTER — Ambulatory Visit (INDEPENDENT_AMBULATORY_CARE_PROVIDER_SITE_OTHER): Payer: Medicare Other | Admitting: Cardiology

## 2016-08-04 VITALS — BP 124/86 | HR 55 | Ht 68.0 in | Wt 232.6 lb

## 2016-08-04 DIAGNOSIS — E784 Other hyperlipidemia: Secondary | ICD-10-CM

## 2016-08-04 DIAGNOSIS — E7849 Other hyperlipidemia: Secondary | ICD-10-CM

## 2016-08-04 NOTE — Patient Instructions (Signed)
Medication Instructions:  The current medical regimen is effective;  continue present plan and medications.  Follow-Up: Follow up with Megan Supple in the [facility].  Follow up in 1 year with Dr. Marlou Porch.  You will receive a letter in the mail 2 months before you are due.  Please call us when you receive this letter to schedule your follow up appointment.  If you need a refill on your cardiac medications before your next appointment, please call your pharmacy.  Thank you for choosing [facility]!!

## 2016-08-04 NOTE — Progress Notes (Signed)
Cardiology Office Note    Date:  08/04/2016   ID:  Lindsey Jordan, DOB 09/26/1951, MRN [id number]  PCP:  Lindsey Naas, MD  Cardiologist:   Lindsey Furbish, MD     History of Present Illness:  Lindsey Jordan is a 65 y.o. female former patient seen by Lindsey Jordan in 2015 at the request of Lindsey Jordan for dyspnea on exertion and history of hypercholesterolemia with strong family history of heart disease. 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  She has tried Lipitor, Crestor, pravastatin (at higher doses-able to tolerate 5 mg at night), WelChol all of which cause the muscle discomfort mostly in her arms and legs. 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  She should be treated aggressively with LDL goal of at least < 100 mg/dL if possible.  In August 2015 she was started on Praluent 75 mg every 2 weeks and tolerated this well.  Her father died of MI at age 35. Her mother died of CHF and had a history of atrial fibrillation and amiodarone toxicity affecting her lungs.  She had a previous lipid panel in 2015 demonstrating total cholesterol of 317, triglycerides of 357, HDL of 41 and calculated LDL of 204.  She was then sent to the lipid clinic for further evaluation.  Labs: 02/2016: TC 322, TG 231, HDL 52, LDL 224 (off Praluent) 10/2014: TC 154, TG 204, LDL 86, HDL 44, non-HDL 109, LFTs trending up (pravastin 5mg  QHS, Praluent 75mg  every 2 weeks) 07/2014: TC 174, TG 317, LDL 89, HDL 38, non-HDL 135, LFTS are WNL (pravastatin 5mg  and Praluent 75mg  every 2 weeks) 05/2014:  TC 216, TG 245, LDL 160, HDL 46, non-HDL 171, LFTs normal (pravastatin 10 mg qd) 03/2014:  TC 317, TG 357, LDL 204, HDL 41, non-HDL 275, LFT normal (not on lipid lowering therapy) --- likely a falsely low LDL due to  TG of 357.  Her non-HDL is 275 mg/dL is very elevated!   She had excellent results to PCS K9 inhibitor however when she switched to Medicare, she had trouble affording. Patient's co-pay was $410 per month. Now it is down to $390. She states that she has decided to bite the bullet and get the medication.  Unfortunately, her mother-in-law recently died. She is being buried in [place]. Her husband went to [employer] in [place]. 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   Past Medical History:  Diagnosis Date  . Hypertension   . Kidney stones     Past Surgical History:  Procedure Laterality Date  . ABDOMINAL HYSTERECTOMY  2003  . ADENOIDECTOMY     age 23  . BREAST LUMPECTOMY     x 2 in her 20's  . BREAST SURGERY    . CESAREAN SECTION     one previous  . ECTOPIC PREGNANCY SURGERY    . HEMORRHOID SURGERY     in 20's  . TONSILLECTOMY      Current Medications: Outpatient Medications Prior to Visit  Medication Sig Dispense Refill  . acyclovir (ZOVIRAX) 200 MG capsule Take 200 mg by mouth 3 (three) times daily. As directed    . Alirocumab (PRALUENT) 75 MG/ML SOPN Inject 1 pen into the skin every 14 (fourteen) days. 2 pen 11  .  atenolol (TENORMIN) 100 MG tablet Take 100 mg by mouth daily.    . clonazePAM (KLONOPIN) 1 MG tablet Take 1 mg by mouth as directed. 1/2-1 tablet at bedtime as needed    . Cranberry 200 MG CAPS Take by mouth daily.    Marland Kitchen FLUTICASONE PROPIONATE  HFA IN Inhale into the lungs. 2 puffs each nostril daily    . Lactobacillus (ACIDOPHILUS) TABS Take by mouth daily.    . traZODone (DESYREL) 100 MG tablet Take 100 mg by mouth at bedtime.    . triamterene-hydrochlorothiazide (MAXZIDE-25) 37.5-25 MG per tablet Take 1 tablet by mouth daily.    Marland Kitchen HYDROcodone-acetaminophen (NORCO/VICODIN) 5-325 MG tablet Take 1 tablet by mouth every 4 (four) hours as needed. 10 tablet 0  . Multiple Vitamin (MULTIVITAMIN) capsule Take 1 capsule by  mouth daily.    . naproxen (NAPROSYN) 500 MG tablet Take 1 tablet (500 mg total) by mouth 2 (two) times daily. 30 tablet 0  . ondansetron (ZOFRAN ODT) 4 MG disintegrating tablet Take 1 tablet (4 mg total) by mouth every 8 (eight) hours as needed for nausea or vomiting. 20 tablet 0  . pravastatin (PRAVACHOL) 10 MG tablet Take 1 tablet (10 mg total) by mouth daily. (Patient taking differently: Take 5 mg by mouth daily. ) 30 tablet 5   No facility-administered medications prior to visit.      Allergies:   Biaxin [clarithromycin]; Statins; and Welchol [colesevelam hcl]   Social History   Social History  . Marital status: Single    Spouse name: N/A  . Number of children: N/A  . Years of education: N/A   Social History Main Topics  . Smoking status: Never Smoker  . Smokeless tobacco: Never Used  . Alcohol use No  . Drug use: No  . Sexual activity: Yes    Birth control/ protection: Surgical   Other Topics Concern  . None   Social History Narrative  . None     Family History:  The patient's family history includes Breast cancer in her maternal aunt; Hypertension in her father.   ROS:   Please see the history of present illness.    ROS All other systems reviewed and are negative.   PHYSICAL EXAM:   VS:  BP 124/86   Pulse (!) 55   Ht 5\' 8"  (1.727 m)   Wt 232 lb 9.6 oz (105.5 kg)   LMP  (LMP Unknown)   BMI 35.37 kg/m    GEN: Well nourished, well developed, in no acute distress  HEENT: normal  Neck: no JVD, carotid bruits, or masses Cardiac: RRR; no significant murmur, rubs, or gallops,no edema  Respiratory:  clear to auscultation bilaterally, normal work of breathing GI: soft, nontender, nondistended, + BS MS: no deformity or atrophy  Skin: warm and dry, no rash, multiple tendon xanthomas noted throughout body. Neuro:  Alert and Oriented x 3, Strength and sensation are intact Psych: euthymic mood, full affect  Wt Readings from Last 3 Encounters:  08/04/16 232 lb 9.6  oz (105.5 kg)  09/15/15 225 lb (102.1 kg)  10/16/14 232 lb 11.2 oz (105.6 kg)      Studies/Labs Reviewed:   EKG:  EKG is ordered today.  The ekg ordered today demonstrates 08/04/16-sinus bradycardia rate 55 borderline LVH, inferior Q waves personally viewed  Recent Labs: 09/15/2015: ALT 24; BUN 23; Creatinine, Ser 0.98; Hemoglobin 14.1; Platelets 192; Potassium 3.0; Sodium 136   Lipid Panel    Component Value Date/Time   CHOL  154 09/14/2015 0852   TRIG 162 (H) 09/14/2015 0852   HDL 42 (L) 09/14/2015 0852   CHOLHDL 3.7 09/14/2015 0852   VLDL 32 (H) 09/14/2015 0852   LDLCALC 80 09/14/2015 0852   LDLDIRECT 86.0 10/15/2014 0743    Additional studies/ records that were reviewed today include:  Prior lab work, office notes, lipid clinic notes reviewed  Echocardiogram 05/14/14:  - Left ventricle: The cavity size was normal. Wall thickness was increased in a pattern of mild LVH. Systolic function was normal. The estimated ejection fraction was in the range of 55% to 60%. - Aortic valve: There was trivial regurgitation. - Left atrium: The atrium was mildly dilated. - Pulmonary arteries: PA peak pressure: 47 mm Hg (S).  Nuclear stress test 05/15/14-no ischemia.  ASSESSMENT:    1. Familial hyperlipidemia      PLAN:  In order of problems listed above:  Familial hyperlipidemia  - On pravastatin 5 mg at night as well as Praulent 75mg  Q 2 weeks. Became too expensive ones on Medicare, $400 co-pay, at one point and her LDL went back up to severely elevated baseline as above. She is now however taking Praulent again.   - I want her to discuss further with Megan. I wonder if Repatha option may be less expensive?-assistance program?  - Expressed the importance of good cholesterol control especially given her familial hyperlipidemia.  Previously a soft systolic murmur had been described. Echocardiogram from 2015 shows trivial aortic regurgitation. Reassuring.   Medication  Adjustments/Labs and Tests Ordered: Current medicines are reviewed at length with the patient today.  Concerns regarding medicines are outlined above.  Medication changes, Labs and Tests ordered today are listed in the Patient Instructions below. Patient Instructions  Medication Instructions:  The current medical regimen is effective;  continue present plan and medications.  Follow-Up: Follow up with Megan Supple in [facility].  Follow up in 1 year with Dr. Marlou Porch.  You will receive a letter in the mail 2 months before you are due.  Please call us when you receive this letter to schedule your follow up appointment.  If you need a refill on your cardiac medications before your next appointment, please call your pharmacy.  Thank you for choosing [facility]!!        Signed, Lindsey Furbish, MD  08/04/2016 12:09 PM    [address], [place], [place]  [address] Phone: [phone]; Fax: [phone]

## 2016-08-08 ENCOUNTER — Ambulatory Visit (INDEPENDENT_AMBULATORY_CARE_PROVIDER_SITE_OTHER): Payer: Medicare Other | Admitting: Pharmacist

## 2016-08-08 DIAGNOSIS — E7800 Pure hypercholesterolemia, unspecified: Secondary | ICD-10-CM

## 2016-08-08 MED ORDER — OMEGA-3-ACID ETHYL ESTERS 1 G PO CAPS: 1.0000 g | ORAL_CAPSULE | Freq: Two times a day (BID) | ORAL | 2 refills | Status: DC

## 2016-08-08 MED ORDER — ALIROCUMAB 75 MG/ML ~~LOC~~ SOPN: 1.0000 "pen " | PEN_INJECTOR | SUBCUTANEOUS | 11 refills | Status: DC

## 2016-08-08 NOTE — Progress Notes (Signed)
Patient ID: Lindsey Jordan                 DOB: 01/13/1951                    MRN: [id number]     HPI: Lindsey Jordan is a 65 y.o. female patient of Dr. Marlou Porch with PMH below that presents today for lipid follow up.  She recently restarted Praluent. Her first dose was last week and she reports that she is tolerating it well. She has been approved for coverage of Praluent.   PAST THERAPIES: She tried lipitor in the late 1990's and failed due to severe muscle aches.  Ten years later she was tried on Crestor, however had to stop due to severe muscle aches again.  Between 2011 and 2013 she tried [facility] a few times, however muscle aches and GI discomfort led her to stop this as well.  She has not been able to tolerate pravastatin 5mg  due to muscle aching. She's never been on Zetia.   FAMILY HISTORY: She has a brother with elevated cholesterol as well, but she's not sure how high.  She had 4 uncles who had MI's in their 60-70's.  Her father died of an MI at 58.  Mother did not have CAD.    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  She should be treated aggressively with LDL goal of at least < 100 mg/dL if possible.  RF:  HeFH, LDL > 240 mg/dL at baseline, family h/o CAD, possible old MI on EKG - LDL goal < 100 mg/dL at least Meds:  Pravastatin 5 mg QHS; Praluent 75mg  every 2 weeks Intolerant:  Crestor, Lipitor, Welchol, Pravastatin 20-40mg  (muscle aches), Pravastatin 5mg  daily    Labs: Labs: 02/2016: TC 322, TG 231, HDL 52, LDL 224 (off Praluent)  10/2014: TC 154, TG 204, LDL 86, HDL 44, non-HDL 109, LFTs trending up (pravastin 5mg  QHS, Praluent 75mg  every 2 weeks) 07/2014: TC 174, TG 317, LDL 89, HDL 38, non-HDL 135, LFTS are WNL (pravastatin 5mg  and Praluent 75mg  every 2 weeks) 05/2014: TC 216, TG 245, LDL 160, HDL 46, non-HDL 171, LFTs normal (pravastatin  10 mg qd) 03/2014: TC 317, TG 357, LDL 204, HDL 41, non-HDL 275, LFT normal (not on lipid lowering therapy) --- likely a falsely low LDL due to TG of 357. Her non-HDL is 275 mg/dL is very elevated!  Past Medical History:  Diagnosis Date  . Hypertension   . Kidney stones     Current Outpatient Prescriptions on File Prior to Visit  Medication Sig Dispense Refill  . acyclovir (ZOVIRAX) 200 MG capsule Take 200 mg by mouth 3 (three) times daily. As directed    . Alirocumab (PRALUENT) 75 MG/ML SOPN Inject 1 pen into the skin every 14 (fourteen) days. 2 pen 11  . atenolol (TENORMIN) 100 MG tablet Take 100 mg by mouth daily.    . clonazePAM (KLONOPIN) 1 MG tablet Take 1 mg by mouth as directed. 1/2-1 tablet at bedtime as needed    . Cranberry 200 MG CAPS Take by mouth daily.    Marland Kitchen FLUTICASONE PROPIONATE  HFA IN Inhale into the lungs. 2 puffs each nostril daily    . Lactobacillus (ACIDOPHILUS) TABS Take by mouth daily.    . traZODone (DESYREL) 100 MG tablet Take 100 mg by  mouth at bedtime.    . triamterene-hydrochlorothiazide (MAXZIDE-25) 37.5-25 MG per tablet Take 1 tablet by mouth daily.     No current facility-administered medications on file prior to visit.     Allergies  Allergen Reactions  . Biaxin [Clarithromycin]     Extreme stomach discomfort  . Statins     Severe muscle aches with Lipitor (1998) and Crestor (2008)  . Welchol [Colesevelam Hcl]     Side effects - muscle and GI    Assessment/Plan:  1. Hyperlipidemia: Patient restarted on Praluent and is tolerating it well. Praluent has been approved, but she will need aid with the copay. Will fill out application for PA with Praluent if copay is unaffordable for her once program available at the beginning of the year. Gave her 45-month's worth of samples in clinic. Recommended fish oil to help lower TG - 4 grams per day (2 grams in the morning and 2 grams in the evening). Told her that she can start with just 2 grams daily and see  if she tolerates it. A lipid panel has been scheduled for 10/10/2016 and a follow-up clinic appointment for 10/17/2016.    Thank you,  Lelan Pons. Patterson Hammersmith, [facility]  08/08/2016 8:06 AM

## 2016-08-08 NOTE — Patient Instructions (Addendum)
[facility] [phone] Lipid clinic with Georgina Peer or Jinny Blossom, pharmacist  Cholesterol Cholesterol is a white, waxy, fat-like substance needed by your body in small amounts. The liver makes all the cholesterol you need. Cholesterol is carried from the liver by the blood through the blood vessels. Deposits of cholesterol (plaque) may build up on blood vessel walls. These make the arteries narrower and stiffer. Cholesterol plaques increase the risk for heart attack and stroke.  You cannot feel your cholesterol level even if it is very high. The only way to know it is high is with a blood test. Once you know your cholesterol levels, you should keep a record of the test results. Work with your health care provider to keep your levels in the desired range.  WHAT DO THE RESULTS MEAN?  Total cholesterol is a rough measure of all the cholesterol in your blood.   LDL is the so-called bad cholesterol. This is the type that deposits cholesterol in the walls of the arteries. You want this level to be low.   HDL is the good cholesterol because it cleans the arteries and carries the LDL away. You want this level to be high.  Triglycerides are fat that the body can either burn for energy or store. High levels are closely linked to heart disease.  WHAT ARE THE DESIRED LEVELS OF CHOLESTEROL?  Total cholesterol below 200.   LDL below 100 for people at risk, below 70 for those at very high risk.   HDL above 50 is good, above 60 is best.   Triglycerides below 150.  HOW CAN I LOWER MY CHOLESTEROL?  Diet. Follow your diet programs as directed by your health care provider.   Choose fish or white meat chicken and [place], roasted or baked. Limit fatty cuts of red meat, fried foods, and processed meats, such as sausage and lunch meats.   Eat lots of fresh fruits and vegetables.  Choose whole grains, beans, pasta, potatoes, and cereals.   Use only small amounts of olive, corn, or canola oils.   Avoid  butter, mayonnaise, shortening, or palm kernel oils.  Avoid foods with trans fats.   Drink skim or nonfat milk and eat low-fat or nonfat yogurt and cheeses. Avoid whole milk, cream, ice cream, egg yolks, and full-fat cheeses.   Healthy desserts include angel food cake, ginger snaps, animal crackers, hard candy, popsicles, and low-fat or nonfat frozen yogurt. Avoid pastries, cakes, pies, and cookies.   Exercise. Follow your exercise programs as directed by your health care provider.   A regular program helps decrease LDL and raise HDL.   A regular program helps with weight control.   Do things that increase your activity level like gardening, walking, or taking the stairs. Ask your health care provider about how you can be more active in your daily life.   Medicine. Take medicine only as directed by your health care provider.   Medicine may be prescribed by your health care provider to help lower cholesterol and decrease the risk for heart disease.   If you have several risk factors, you may need medicine even if your levels are normal.   This information is not intended to replace advice given to you by your health care provider. Make sure you discuss any questions you have with your health care provider.   Document Released: 06/14/2001 Document Revised: 10/10/2014 Document Reviewed: 07/03/2013 Elsevier Interactive Patient Education [employer].

## 2016-08-16 ENCOUNTER — Ambulatory Visit: Payer: Medicare Other | Admitting: Cardiology

## 2016-09-13 NOTE — Telephone Encounter (Signed)
Lindsey Jordan, Lindsey Jordan started Praluent injections 08/08/2016

## 2016-10-10 ENCOUNTER — Other Ambulatory Visit: Payer: Medicare Other | Admitting: *Deleted

## 2016-10-10 DIAGNOSIS — E7800 Pure hypercholesterolemia, unspecified: Secondary | ICD-10-CM

## 2016-10-10 NOTE — Addendum Note (Signed)
Addended by: Eulis Foster on: 10/10/2016 08:08 AM   Modules accepted: Orders

## 2016-10-11 LAB — HEPATIC FUNCTION PANEL
ALBUMIN: 4 g/dL (ref 3.6–4.8)
ALK PHOS: 90 IU/L (ref 39–117)
ALT: 29 IU/L (ref 0–32)
AST: 21 IU/L (ref 0–40)
BILIRUBIN TOTAL: 0.3 mg/dL (ref 0.0–1.2)
Bilirubin, Direct: 0.1 mg/dL (ref 0.00–0.40)
Total Protein: 6.4 g/dL (ref 6.0–8.5)

## 2016-10-11 LAB — LIPID PANEL
CHOLESTEROL TOTAL: 175 mg/dL (ref 100–199)
Chol/HDL Ratio: 4.2 ratio units (ref 0.0–4.4)
HDL: 42 mg/dL (ref >39)
LDL Calculated: 77 mg/dL (ref 0–99)
Triglycerides: 279 mg/dL — ABNORMAL HIGH (ref 0–149)
VLDL Cholesterol Cal: 56 mg/dL — ABNORMAL HIGH (ref 5–40)

## 2016-10-17 ENCOUNTER — Ambulatory Visit: Payer: Medicare Other

## 2016-10-24 DIAGNOSIS — Z23 Encounter for immunization: Secondary | ICD-10-CM | POA: Diagnosis not present

## 2016-12-06 DIAGNOSIS — H524 Presbyopia: Secondary | ICD-10-CM | POA: Diagnosis not present

## 2016-12-06 DIAGNOSIS — H2513 Age-related nuclear cataract, bilateral: Secondary | ICD-10-CM | POA: Diagnosis not present

## 2016-12-08 DIAGNOSIS — D171 Benign lipomatous neoplasm of skin and subcutaneous tissue of trunk: Secondary | ICD-10-CM | POA: Diagnosis not present

## 2016-12-08 DIAGNOSIS — G4700 Insomnia, unspecified: Secondary | ICD-10-CM | POA: Diagnosis not present

## 2016-12-08 DIAGNOSIS — I10 Essential (primary) hypertension: Secondary | ICD-10-CM | POA: Diagnosis not present

## 2017-01-10 ENCOUNTER — Other Ambulatory Visit: Payer: Medicare Other

## 2017-05-22 DIAGNOSIS — Z1231 Encounter for screening mammogram for malignant neoplasm of breast: Secondary | ICD-10-CM | POA: Diagnosis not present

## 2017-06-20 DIAGNOSIS — Z23 Encounter for immunization: Secondary | ICD-10-CM | POA: Diagnosis not present

## 2017-07-24 DIAGNOSIS — Z6837 Body mass index (BMI) 37.0-37.9, adult: Secondary | ICD-10-CM | POA: Diagnosis not present

## 2017-07-24 DIAGNOSIS — G4700 Insomnia, unspecified: Secondary | ICD-10-CM | POA: Diagnosis not present

## 2017-07-24 DIAGNOSIS — Z0001 Encounter for general adult medical examination with abnormal findings: Secondary | ICD-10-CM | POA: Diagnosis not present

## 2017-07-24 DIAGNOSIS — Z1389 Encounter for screening for other disorder: Secondary | ICD-10-CM | POA: Diagnosis not present

## 2017-07-24 DIAGNOSIS — E2839 Other primary ovarian failure: Secondary | ICD-10-CM | POA: Diagnosis not present

## 2017-07-24 DIAGNOSIS — F419 Anxiety disorder, unspecified: Secondary | ICD-10-CM | POA: Diagnosis not present

## 2017-07-24 DIAGNOSIS — I10 Essential (primary) hypertension: Secondary | ICD-10-CM | POA: Diagnosis not present

## 2017-07-24 DIAGNOSIS — F320 Major depressive disorder, single episode, mild: Secondary | ICD-10-CM | POA: Diagnosis not present

## 2017-07-24 DIAGNOSIS — Z23 Encounter for immunization: Secondary | ICD-10-CM | POA: Diagnosis not present

## 2017-07-24 DIAGNOSIS — E7800 Pure hypercholesterolemia, unspecified: Secondary | ICD-10-CM | POA: Diagnosis not present

## 2017-07-24 DIAGNOSIS — L853 Xerosis cutis: Secondary | ICD-10-CM | POA: Diagnosis not present

## 2017-07-24 DIAGNOSIS — D179 Benign lipomatous neoplasm, unspecified: Secondary | ICD-10-CM | POA: Diagnosis not present

## 2017-07-25 DIAGNOSIS — R739 Hyperglycemia, unspecified: Secondary | ICD-10-CM | POA: Diagnosis not present

## 2017-08-21 DIAGNOSIS — E119 Type 2 diabetes mellitus without complications: Secondary | ICD-10-CM | POA: Diagnosis not present

## 2017-08-21 DIAGNOSIS — F320 Major depressive disorder, single episode, mild: Secondary | ICD-10-CM | POA: Diagnosis not present

## 2017-09-22 DIAGNOSIS — R1084 Generalized abdominal pain: Secondary | ICD-10-CM | POA: Diagnosis not present

## 2017-10-02 DIAGNOSIS — E2839 Other primary ovarian failure: Secondary | ICD-10-CM | POA: Diagnosis not present

## 2017-10-12 DIAGNOSIS — R109 Unspecified abdominal pain: Secondary | ICD-10-CM | POA: Diagnosis not present

## 2017-10-12 DIAGNOSIS — G479 Sleep disorder, unspecified: Secondary | ICD-10-CM | POA: Diagnosis not present

## 2017-10-12 DIAGNOSIS — Z8371 Family history of colonic polyps: Secondary | ICD-10-CM | POA: Diagnosis not present

## 2017-10-16 DIAGNOSIS — D2262 Melanocytic nevi of left upper limb, including shoulder: Secondary | ICD-10-CM | POA: Diagnosis not present

## 2017-10-16 DIAGNOSIS — C44719 Basal cell carcinoma of skin of left lower limb, including hip: Secondary | ICD-10-CM | POA: Diagnosis not present

## 2017-10-16 DIAGNOSIS — L814 Other melanin hyperpigmentation: Secondary | ICD-10-CM | POA: Diagnosis not present

## 2017-10-16 DIAGNOSIS — C44619 Basal cell carcinoma of skin of left upper limb, including shoulder: Secondary | ICD-10-CM | POA: Diagnosis not present

## 2017-10-16 DIAGNOSIS — D1722 Benign lipomatous neoplasm of skin and subcutaneous tissue of left arm: Secondary | ICD-10-CM | POA: Diagnosis not present

## 2017-10-16 DIAGNOSIS — C44519 Basal cell carcinoma of skin of other part of trunk: Secondary | ICD-10-CM | POA: Diagnosis not present

## 2017-10-16 DIAGNOSIS — D1801 Hemangioma of skin and subcutaneous tissue: Secondary | ICD-10-CM | POA: Diagnosis not present

## 2017-10-16 DIAGNOSIS — D225 Melanocytic nevi of trunk: Secondary | ICD-10-CM | POA: Diagnosis not present

## 2017-10-16 DIAGNOSIS — L821 Other seborrheic keratosis: Secondary | ICD-10-CM | POA: Diagnosis not present

## 2017-10-16 DIAGNOSIS — L309 Dermatitis, unspecified: Secondary | ICD-10-CM | POA: Diagnosis not present

## 2017-10-25 DIAGNOSIS — Z85828 Personal history of other malignant neoplasm of skin: Secondary | ICD-10-CM | POA: Diagnosis not present

## 2017-10-25 DIAGNOSIS — C44619 Basal cell carcinoma of skin of left upper limb, including shoulder: Secondary | ICD-10-CM | POA: Diagnosis not present

## 2017-10-25 DIAGNOSIS — C44519 Basal cell carcinoma of skin of other part of trunk: Secondary | ICD-10-CM | POA: Diagnosis not present

## 2017-10-25 DIAGNOSIS — C44719 Basal cell carcinoma of skin of left lower limb, including hip: Secondary | ICD-10-CM | POA: Diagnosis not present

## 2018-01-02 DIAGNOSIS — E119 Type 2 diabetes mellitus without complications: Secondary | ICD-10-CM | POA: Diagnosis not present

## 2018-01-02 DIAGNOSIS — H5213 Myopia, bilateral: Secondary | ICD-10-CM | POA: Diagnosis not present

## 2018-01-02 DIAGNOSIS — H2513 Age-related nuclear cataract, bilateral: Secondary | ICD-10-CM | POA: Diagnosis not present

## 2018-01-16 ENCOUNTER — Other Ambulatory Visit: Payer: Self-pay | Admitting: Family Medicine

## 2018-01-16 DIAGNOSIS — N649 Disorder of breast, unspecified: Secondary | ICD-10-CM

## 2018-01-16 DIAGNOSIS — I10 Essential (primary) hypertension: Secondary | ICD-10-CM | POA: Diagnosis not present

## 2018-01-16 DIAGNOSIS — E119 Type 2 diabetes mellitus without complications: Secondary | ICD-10-CM | POA: Diagnosis not present

## 2018-01-16 DIAGNOSIS — D241 Benign neoplasm of right breast: Secondary | ICD-10-CM | POA: Diagnosis not present

## 2018-01-16 DIAGNOSIS — E7800 Pure hypercholesterolemia, unspecified: Secondary | ICD-10-CM | POA: Diagnosis not present

## 2018-01-16 DIAGNOSIS — F419 Anxiety disorder, unspecified: Secondary | ICD-10-CM | POA: Diagnosis not present

## 2018-01-16 DIAGNOSIS — D242 Benign neoplasm of left breast: Secondary | ICD-10-CM | POA: Diagnosis not present

## 2018-01-16 DIAGNOSIS — N63 Unspecified lump in unspecified breast: Secondary | ICD-10-CM | POA: Diagnosis not present

## 2018-01-16 DIAGNOSIS — R928 Other abnormal and inconclusive findings on diagnostic imaging of breast: Secondary | ICD-10-CM | POA: Diagnosis not present

## 2018-01-16 DIAGNOSIS — F320 Major depressive disorder, single episode, mild: Secondary | ICD-10-CM | POA: Diagnosis not present

## 2018-01-25 DIAGNOSIS — E119 Type 2 diabetes mellitus without complications: Secondary | ICD-10-CM | POA: Diagnosis not present

## 2018-01-30 DIAGNOSIS — D3141 Benign neoplasm of right ciliary body: Secondary | ICD-10-CM | POA: Diagnosis not present

## 2018-01-30 DIAGNOSIS — H2513 Age-related nuclear cataract, bilateral: Secondary | ICD-10-CM | POA: Diagnosis not present

## 2018-01-30 DIAGNOSIS — H353121 Nonexudative age-related macular degeneration, left eye, early dry stage: Secondary | ICD-10-CM | POA: Diagnosis not present

## 2018-03-01 DIAGNOSIS — H25811 Combined forms of age-related cataract, right eye: Secondary | ICD-10-CM | POA: Diagnosis not present

## 2018-03-01 DIAGNOSIS — H2511 Age-related nuclear cataract, right eye: Secondary | ICD-10-CM | POA: Diagnosis not present

## 2018-03-08 DIAGNOSIS — H2512 Age-related nuclear cataract, left eye: Secondary | ICD-10-CM | POA: Diagnosis not present

## 2018-03-08 DIAGNOSIS — H25812 Combined forms of age-related cataract, left eye: Secondary | ICD-10-CM | POA: Diagnosis not present

## 2018-04-24 DIAGNOSIS — D171 Benign lipomatous neoplasm of skin and subcutaneous tissue of trunk: Secondary | ICD-10-CM | POA: Diagnosis not present

## 2018-04-24 DIAGNOSIS — L821 Other seborrheic keratosis: Secondary | ICD-10-CM | POA: Diagnosis not present

## 2018-04-24 DIAGNOSIS — D1801 Hemangioma of skin and subcutaneous tissue: Secondary | ICD-10-CM | POA: Diagnosis not present

## 2018-04-24 DIAGNOSIS — D485 Neoplasm of uncertain behavior of skin: Secondary | ICD-10-CM | POA: Diagnosis not present

## 2018-04-24 DIAGNOSIS — Z85828 Personal history of other malignant neoplasm of skin: Secondary | ICD-10-CM | POA: Diagnosis not present

## 2018-04-24 DIAGNOSIS — L570 Actinic keratosis: Secondary | ICD-10-CM | POA: Diagnosis not present

## 2018-04-24 DIAGNOSIS — D1722 Benign lipomatous neoplasm of skin and subcutaneous tissue of left arm: Secondary | ICD-10-CM | POA: Diagnosis not present

## 2018-04-24 DIAGNOSIS — D0462 Carcinoma in situ of skin of left upper limb, including shoulder: Secondary | ICD-10-CM | POA: Diagnosis not present

## 2018-04-24 DIAGNOSIS — D045 Carcinoma in situ of skin of trunk: Secondary | ICD-10-CM | POA: Diagnosis not present

## 2018-04-24 DIAGNOSIS — L853 Xerosis cutis: Secondary | ICD-10-CM | POA: Diagnosis not present

## 2018-04-24 DIAGNOSIS — L814 Other melanin hyperpigmentation: Secondary | ICD-10-CM | POA: Diagnosis not present

## 2018-04-24 DIAGNOSIS — L738 Other specified follicular disorders: Secondary | ICD-10-CM | POA: Diagnosis not present

## 2018-05-08 DIAGNOSIS — Z85828 Personal history of other malignant neoplasm of skin: Secondary | ICD-10-CM | POA: Diagnosis not present

## 2018-05-08 DIAGNOSIS — D045 Carcinoma in situ of skin of trunk: Secondary | ICD-10-CM | POA: Diagnosis not present

## 2018-07-11 DIAGNOSIS — Z23 Encounter for immunization: Secondary | ICD-10-CM | POA: Diagnosis not present

## 2018-07-30 DIAGNOSIS — E669 Obesity, unspecified: Secondary | ICD-10-CM | POA: Diagnosis not present

## 2018-07-30 DIAGNOSIS — F419 Anxiety disorder, unspecified: Secondary | ICD-10-CM | POA: Diagnosis not present

## 2018-07-30 DIAGNOSIS — G4700 Insomnia, unspecified: Secondary | ICD-10-CM | POA: Diagnosis not present

## 2018-07-30 DIAGNOSIS — Z0000 Encounter for general adult medical examination without abnormal findings: Secondary | ICD-10-CM | POA: Diagnosis not present

## 2018-07-30 DIAGNOSIS — E119 Type 2 diabetes mellitus without complications: Secondary | ICD-10-CM | POA: Diagnosis not present

## 2018-07-30 DIAGNOSIS — I10 Essential (primary) hypertension: Secondary | ICD-10-CM | POA: Diagnosis not present

## 2018-07-30 DIAGNOSIS — Z6838 Body mass index (BMI) 38.0-38.9, adult: Secondary | ICD-10-CM | POA: Diagnosis not present

## 2018-07-30 DIAGNOSIS — R609 Edema, unspecified: Secondary | ICD-10-CM | POA: Diagnosis not present

## 2018-07-30 DIAGNOSIS — E7800 Pure hypercholesterolemia, unspecified: Secondary | ICD-10-CM | POA: Diagnosis not present

## 2018-08-28 DIAGNOSIS — H9313 Tinnitus, bilateral: Secondary | ICD-10-CM | POA: Diagnosis not present

## 2018-08-28 DIAGNOSIS — H903 Sensorineural hearing loss, bilateral: Secondary | ICD-10-CM | POA: Diagnosis not present

## 2018-08-28 DIAGNOSIS — Z822 Family history of deafness and hearing loss: Secondary | ICD-10-CM | POA: Diagnosis not present

## 2018-10-22 DIAGNOSIS — D1801 Hemangioma of skin and subcutaneous tissue: Secondary | ICD-10-CM | POA: Diagnosis not present

## 2018-10-22 DIAGNOSIS — D225 Melanocytic nevi of trunk: Secondary | ICD-10-CM | POA: Diagnosis not present

## 2018-10-22 DIAGNOSIS — L281 Prurigo nodularis: Secondary | ICD-10-CM | POA: Diagnosis not present

## 2018-10-22 DIAGNOSIS — D485 Neoplasm of uncertain behavior of skin: Secondary | ICD-10-CM | POA: Diagnosis not present

## 2018-10-22 DIAGNOSIS — L821 Other seborrheic keratosis: Secondary | ICD-10-CM | POA: Diagnosis not present

## 2018-10-22 DIAGNOSIS — L570 Actinic keratosis: Secondary | ICD-10-CM | POA: Diagnosis not present

## 2018-10-22 DIAGNOSIS — C44612 Basal cell carcinoma of skin of right upper limb, including shoulder: Secondary | ICD-10-CM | POA: Diagnosis not present

## 2018-10-22 DIAGNOSIS — D1722 Benign lipomatous neoplasm of skin and subcutaneous tissue of left arm: Secondary | ICD-10-CM | POA: Diagnosis not present

## 2018-10-22 DIAGNOSIS — L814 Other melanin hyperpigmentation: Secondary | ICD-10-CM | POA: Diagnosis not present

## 2018-10-22 DIAGNOSIS — Z85828 Personal history of other malignant neoplasm of skin: Secondary | ICD-10-CM | POA: Diagnosis not present

## 2018-10-22 DIAGNOSIS — D1721 Benign lipomatous neoplasm of skin and subcutaneous tissue of right arm: Secondary | ICD-10-CM | POA: Diagnosis not present

## 2018-10-30 DIAGNOSIS — J209 Acute bronchitis, unspecified: Secondary | ICD-10-CM | POA: Diagnosis not present

## 2019-01-30 DIAGNOSIS — G4700 Insomnia, unspecified: Secondary | ICD-10-CM | POA: Diagnosis not present

## 2019-01-30 DIAGNOSIS — E7800 Pure hypercholesterolemia, unspecified: Secondary | ICD-10-CM | POA: Diagnosis not present

## 2019-01-30 DIAGNOSIS — I10 Essential (primary) hypertension: Secondary | ICD-10-CM | POA: Diagnosis not present

## 2019-01-30 DIAGNOSIS — F419 Anxiety disorder, unspecified: Secondary | ICD-10-CM | POA: Diagnosis not present

## 2019-01-30 DIAGNOSIS — E1169 Type 2 diabetes mellitus with other specified complication: Secondary | ICD-10-CM | POA: Diagnosis not present

## 2019-03-28 DIAGNOSIS — E7800 Pure hypercholesterolemia, unspecified: Secondary | ICD-10-CM | POA: Diagnosis not present

## 2019-03-28 DIAGNOSIS — E1169 Type 2 diabetes mellitus with other specified complication: Secondary | ICD-10-CM | POA: Diagnosis not present

## 2019-03-29 ENCOUNTER — Telehealth: Payer: Self-pay | Admitting: Pharmacist

## 2019-03-29 MED ORDER — PRALUENT 150 MG/ML ~~LOC~~ SOAJ: 1.0000 "pen " | SUBCUTANEOUS | 11 refills | Status: DC

## 2019-03-29 NOTE — Telephone Encounter (Signed)
Called pharmacy, copay $47/month. Pt is aware and will have her PCP check follow up labs in 2 months.

## 2019-03-29 NOTE — Telephone Encounter (Addendum)
Pt called into clinic to inquire about her Praluent. I previously saw pt in 2017 for lipid management - she reports she stopped her Praluent shots about a year and a half ago due to cost (copay $350 per month). She is wondering if the cost has come down at all since then. Advised pt that PCSK9i therapy is a Tier 3 drug now on most insurance plans with monthly copays ~$50. Pt would like to resume Praluent as this would be affordable for her.  She had her cholesterol checked at her PCP office yesterday:  TC 305, LDL 206, HDL 38, non-HDL 267, TG 303  Submitted prior authorization for higher dose of Praluent 150 (previously took 75mg  dose) since LDL is > 200. Will follow up with pt once PA is approved and I can confirm cost at pharmacy.

## 2019-04-10 DIAGNOSIS — E1169 Type 2 diabetes mellitus with other specified complication: Secondary | ICD-10-CM | POA: Diagnosis not present

## 2019-04-10 DIAGNOSIS — E7800 Pure hypercholesterolemia, unspecified: Secondary | ICD-10-CM | POA: Diagnosis not present

## 2019-04-10 DIAGNOSIS — I10 Essential (primary) hypertension: Secondary | ICD-10-CM | POA: Diagnosis not present

## 2019-04-16 DIAGNOSIS — Z961 Presence of intraocular lens: Secondary | ICD-10-CM | POA: Diagnosis not present

## 2019-04-16 DIAGNOSIS — E119 Type 2 diabetes mellitus without complications: Secondary | ICD-10-CM | POA: Diagnosis not present

## 2019-04-29 DIAGNOSIS — Z85828 Personal history of other malignant neoplasm of skin: Secondary | ICD-10-CM | POA: Diagnosis not present

## 2019-04-29 DIAGNOSIS — D0472 Carcinoma in situ of skin of left lower limb, including hip: Secondary | ICD-10-CM | POA: Diagnosis not present

## 2019-04-29 DIAGNOSIS — L570 Actinic keratosis: Secondary | ICD-10-CM | POA: Diagnosis not present

## 2019-04-29 DIAGNOSIS — L821 Other seborrheic keratosis: Secondary | ICD-10-CM | POA: Diagnosis not present

## 2019-04-29 DIAGNOSIS — L814 Other melanin hyperpigmentation: Secondary | ICD-10-CM | POA: Diagnosis not present

## 2019-04-29 DIAGNOSIS — B353 Tinea pedis: Secondary | ICD-10-CM | POA: Diagnosis not present

## 2019-04-29 DIAGNOSIS — D1801 Hemangioma of skin and subcutaneous tissue: Secondary | ICD-10-CM | POA: Diagnosis not present

## 2019-04-29 DIAGNOSIS — L281 Prurigo nodularis: Secondary | ICD-10-CM | POA: Diagnosis not present

## 2019-05-16 ENCOUNTER — Ambulatory Visit: Payer: Medicare Other | Admitting: *Deleted

## 2019-05-21 DIAGNOSIS — Z803 Family history of malignant neoplasm of breast: Secondary | ICD-10-CM | POA: Diagnosis not present

## 2019-05-21 DIAGNOSIS — Z1231 Encounter for screening mammogram for malignant neoplasm of breast: Secondary | ICD-10-CM | POA: Diagnosis not present

## 2019-05-24 ENCOUNTER — Encounter: Payer: Medicare Other | Attending: Family Medicine | Admitting: Dietician

## 2019-05-24 ENCOUNTER — Encounter: Payer: Self-pay | Admitting: Dietician

## 2019-05-24 ENCOUNTER — Other Ambulatory Visit: Payer: Self-pay

## 2019-05-24 DIAGNOSIS — E119 Type 2 diabetes mellitus without complications: Secondary | ICD-10-CM | POA: Insufficient documentation

## 2019-05-24 NOTE — Progress Notes (Signed)
Diabetes Self-Management Education  Visit Type: First/Initial  Appt. Start Time: 0945 Appt. End Time: 1130  05/24/2019  [name], identified by name and date of birth, is a 68 y.o. female with a diagnosis of Diabetes: Type 2.   ASSESSMENT Patient is here today alone for newly diagnosed diabetes. Other history includes HTN, GDM, high cholesterol (on injection medication for this) and CKD.  She states that she is concerned about her thyroid.  She states that the labs are low normal.  She complains that she gets shaky during the day. Sleeps 8 hours per day Complains of sinus drainage that hurts her stomach around 6 pm each night and therefore eats a late dinner. Labs include:  A1C 6.5%, Cholesterol 306, Triglycerides 303, HDL 38, LDL 206, eGFR 65 03/28/2019  Weight hx:   250 lbs today. Thinks she gained weight with the COVID pandemic Got to goal weight with [facility] after birth of her daughter (145 lbs) and then got pregnant.   140 lbs lowest adult weight 30's and 40's. Currently would like to lose 80 lbs. Lost 20 lbs last year with [facility].  She regained this. Lost weight in the past with walking, drinking more water, and being more mindful of portion sizes.  Patient lives with her husband.  She orders her groceries on line and they share cooking.  They eat very simply.  They get take out 4 times per week.  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  Currently she is working part time in [place].  She started a clinical trial for the Corona virus vaccine and had her first injection yesterday. She enjoys walking, has a stationary bike, used to enjoy water aerobics.  Dislikes drinking water but will drink more with Mio flavor added.   Height _0  (1.727 m), weight 250 lb (113.4 kg). Body mass index is 38.01 kg/m.  Diabetes Self-Management Education - 05/24/19 1021      Visit Information   Visit Type  First/Initial      Initial Visit   Diabetes  Type  Type 2    Are you currently following a meal plan?  No    Are you taking your medications as prescribed?  Not on Medications    Date Diagnosed  03/2019      Health Coping   How would you rate your overall health?  Good      Psychosocial Assessment   Patient Belief/Attitude about Diabetes  Motivated to manage diabetes    Self-care barriers  None    Self-management support  Doctor's office;Family;CDE visits    Other persons present  Patient    Patient Concerns  Nutrition/Meal planning;Glycemic Control;Weight Control    Special Needs  None    Preferred Learning Style  No preference indicated    Learning Readiness  Ready    How often do you need to have someone help you when you read instructions, pamphlets, or other written materials from your doctor or pharmacy?  1 - Never    What is the last grade level you completed in school?  4 years college      Pre-Education Assessment   Patient understands the diabetes disease and treatment process.  Needs Instruction    Patient understands incorporating nutritional management into lifestyle.  Needs Instruction    Patient undertands incorporating physical activity into lifestyle.  Needs Instruction    Patient understands using medications safely.  Needs Instruction    Patient understands monitoring blood glucose,  interpreting and using results  Needs Instruction    Patient understands prevention, detection, and treatment of acute complications.  Needs Instruction    Patient understands prevention, detection, and treatment of chronic complications.  Needs Instruction    Patient understands how to develop strategies to address psychosocial issues.  Needs Instruction    Patient understands how to develop strategies to promote health/change behavior.  Needs Instruction      Complications   Last HgB A1C per patient/outside source  6.5 %    How often do you check your blood sugar?  0 times/day (not testing)    Have you had a dilated eye exam in  the past 12 months?  Yes    Have you had a dental exam in the past 12 months?  Yes    Are you checking your feet?  Yes    How many days per week are you checking your feet?  7      Dietary Intake   Breakfast  Cereal (Mini wheats or raisin bran or honey nut cheeriors), skim-2% milk, 1-2 cups coffee with 1 tsp powdered creamer, sweet and low and splend, occasional eggs and toast   8-9   Snack (morning)  none    Lunch  skips often OR salad with protein OR hotdog OR BLT OR small hamburger or raw vegetables with [place] dressing   1-3   Snack (afternoon)  rare teddt graham's or small chocolate bar, fruit    Dinner  [place] OR breakfast for dinner OR [employer] OR tomato sandwich OR chicken strips and a salad OR [place] [employer] and Bagel Roast Beef sandwich   8-9   Snack (evening)  occasional teddy graham's    Beverage(s)  water "not much", coffee, 1 regular bottle coke      Exercise   Exercise Type  ADL's      Patient Education   Previous Diabetes Education  Yes (please comment)   A while ago.  She can't remember why.   Disease state   Definition of diabetes, type 1 and 2, and the diagnosis of diabetes;Factors that contribute to the development of diabetes;Explored patient's options for treatment of their diabetes    Nutrition management   Role of diet in the treatment of diabetes and the relationship between the three main macronutrients and blood glucose level;Food label reading, portion sizes and measuring food.;Carbohydrate counting;Information on hints to eating out and maintain blood glucose control.;Meal options for control of blood glucose level and chronic complications.    Physical activity and exercise   Role of exercise on diabetes management, blood pressure control and cardiac health.;Helped patient identify appropriate exercises in relation to his/her diabetes, diabetes complications and other health issue.    Monitoring  Purpose and frequency of SMBG.;Identified appropriate SMBG and/or  A1C goals.;Daily foot exams;Yearly dilated eye exam    Acute complications  Taught treatment of hypoglycemia - the 15 rule.    Chronic complications  Relationship between chronic complications and blood glucose control;Retinopathy and reason for yearly dilated eye exams    Personal strategies to promote health  Lifestyle issues that need to be addressed for better diabetes care      Individualized Goals (developed by patient)   Nutrition  General guidelines for healthy choices and portions discussed;Follow meal plan discussed    Physical Activity  Exercise 5-7 days per week;30 minutes per day    Medications  Not Applicable    Monitoring   Not Applicable    Reducing Risk  increase portions of healthy fats    Health Coping  discuss diabetes with (comment)   MD, RD, CDE     Post-Education Assessment   Patient understands the diabetes disease and treatment process.  Demonstrates understanding / competency    Patient understands incorporating nutritional management into lifestyle.  Needs Review    Patient undertands incorporating physical activity into lifestyle.  Demonstrates understanding / competency    Patient understands using medications safely.  Demonstrates understanding / competency    Patient understands monitoring blood glucose, interpreting and using results  Demonstrates understanding / competency    Patient understands prevention, detection, and treatment of acute complications.  Demonstrates understanding / competency    Patient understands prevention, detection, and treatment of chronic complications.  Demonstrates understanding / competency    Patient understands how to develop strategies to address psychosocial issues.  Demonstrates understanding / competency    Patient understands how to develop strategies to promote health/change behavior.  Needs Review      Outcomes   Expected Outcomes  Demonstrated interest in learning. Expect positive outcomes    Future DMSE  2 months     Program Status  Completed       Individualized Plan for Diabetes Self-Management Training:   Learning Objective:  Patient will have a greater understanding of diabetes self-management. Patient education plan is to attend individual and/or group sessions per assessed needs and concerns.   Plan:   Patient Instructions  PLAN: Drink more water.   Aim for beverages without carbohydrates. Find ways to be more active.  Goal=30 minutes per day.  Aim for 3-4 Carb Choices per meal (45-60 grams) +/- 1 either way  Aim for 0-1 Carbs per snack if hungry  Include protein in moderation with your meals and snacks Consider reading food labels for Total Carbohydrate of foods  Mindfulness      Expected Outcomes:  Demonstrated interest in learning. Expect positive outcomes  Education material provided: ADA - How to Thrive: A Guide for Your Journey with Diabetes, Meal plan card and Snack sheet, diabetes resources, eating out tips  If problems or questions, patient to contact team via:  Phone and Email  Future DSME appointment: 2 months

## 2019-05-24 NOTE — Patient Instructions (Addendum)
PLAN: Drink more water.   Aim for beverages without carbohydrates. Decrease portion size of cereal. Find ways to be more active.  Goal=30 minutes per day.   Aim for 3-4 Carb Choices per meal (45-60 grams) +/- 1 either way  Aim for 0-1 Carbs per snack if hungry  Include protein in moderation with your meals and snacks Consider reading food labels for Total Carbohydrate of foods  Mindfulness

## 2019-06-11 ENCOUNTER — Ambulatory Visit: Payer: Medicare Other | Admitting: *Deleted

## 2019-06-11 ENCOUNTER — Encounter

## 2019-07-26 ENCOUNTER — Other Ambulatory Visit: Payer: Self-pay

## 2019-07-26 ENCOUNTER — Encounter: Payer: Medicare Other | Attending: Family Medicine | Admitting: Dietician

## 2019-07-26 ENCOUNTER — Encounter: Payer: Self-pay | Admitting: Dietician

## 2019-07-26 DIAGNOSIS — E119 Type 2 diabetes mellitus without complications: Secondary | ICD-10-CM | POA: Diagnosis not present

## 2019-07-26 NOTE — Patient Instructions (Addendum)
Great job on being more intentional/mindful! Great job on choosing more water rather than beverages with carbohydrates. Consider options for breakfast rather than cereal.  Muffin tin eggs, fresh fruit  Whole wheat toast, peanut butter, fresh fruit  Whole wheat cheese toast, fruit Consider meal planning   Grilled protein ("deck of cards"), salad, steamed or grilled vegetables  Bean soup, salad

## 2019-07-26 NOTE — Progress Notes (Signed)
Diabetes Self-Management Education  Visit Type: Follow-up  Appt. Start Time: 1040 Appt. End Time: 1110  07/29/2019  [name], identified by name and date of birth, is a 68 y.o. female with a diagnosis of Diabetes: Type 2.   ASSESSMENT Since last visit: Choosing better food when eating out and feels fine in leaving food there. She states that they continue to choose increased take-out. States that she is eating too much sugar cereal. She has become aware of the amount of cream that she has been using in her coffee. She has been drinking more water.  Decreased regular coke intake. States that she should be better in her down time to become more intentionally active. Plans on using [employer] to go for grocery shopping.  Increased problems with benign lipomas.  Weight 251 lb (113.9 kg). Body mass index is 38.16 kg/m.  Diabetes Self-Management Education - 07/29/19 0604      Visit Information   Visit Type  Follow-up      Initial Visit   Diabetes Type  Type 2      Psychosocial Assessment   Patient Belief/Attitude about Diabetes  Motivated to manage diabetes    Other persons present  Patient    Patient Concerns  Healthy Lifestyle;Weight Control    Special Needs  None    Learning Readiness  Ready      Pre-Education Assessment   Patient understands the diabetes disease and treatment process.  Demonstrates understanding / competency    Patient understands incorporating nutritional management into lifestyle.  Needs Review    Patient undertands incorporating physical activity into lifestyle.  Demonstrates understanding / competency    Patient understands using medications safely.  Demonstrates understanding / competency    Patient understands monitoring blood glucose, interpreting and using results  Demonstrates understanding / competency    Patient understands prevention, detection, and treatment of acute complications.  Demonstrates understanding / competency    Patient  understands prevention, detection, and treatment of chronic complications.  Demonstrates understanding / competency    Patient understands how to develop strategies to address psychosocial issues.  Demonstrates understanding / competency    Patient understands how to develop strategies to promote health/change behavior.  Needs Review      Patient Education   Previous Diabetes Education  Yes (please comment)   05/24/2019 RD   Nutrition management   Information on hints to eating out and maintain blood glucose control.;Meal options for control of blood glucose level and chronic complications.      Individualized Goals (developed by patient)   Nutrition  General guidelines for healthy choices and portions discussed    Physical Activity  Exercise 5-7 days per week;30 minutes per day    Reducing Risk  increase portions of healthy fats      Patient Self-Evaluation of Goals - Patient rates self as meeting previously set goals (% of time)   Nutrition  >75%    Physical Activity  >75%    Medications  Not Applicable    Monitoring  Not Applicable    Problem Solving  >75%    Reducing Risk  >75%    Health Coping  >75%      Post-Education Assessment   Patient understands the diabetes disease and treatment process.  Demonstrates understanding / competency    Patient understands incorporating nutritional management into lifestyle.  Demonstrates understanding / competency    Patient undertands incorporating physical activity into lifestyle.  Demonstrates understanding / competency    Patient understands using  medications safely.  Demonstrates understanding / competency    Patient understands monitoring blood glucose, interpreting and using results  Demonstrates understanding / competency    Patient understands prevention, detection, and treatment of acute complications.  Demonstrates understanding / competency    Patient understands prevention, detection, and treatment of chronic complications.   Demonstrates understanding / competency    Patient understands how to develop strategies to address psychosocial issues.  Demonstrates understanding / competency    Patient understands how to develop strategies to promote health/change behavior.  Needs Review      Outcomes   Expected Outcomes  Demonstrated interest in learning. Expect positive outcomes    Future DMSE  3-4 months    Program Status  Completed      Subsequent Visit   Since your last visit have you experienced any weight changes?  No change       Individualized Plan for Diabetes Self-Management Training:   Learning Objective:  Patient will have a greater understanding of diabetes self-management. Patient education plan is to attend individual and/or group sessions per assessed needs and concerns.   Plan:   Patient Instructions  Doristine Devoid job on being more intentional/mindful! Great job on choosing more water rather than beverages with carbohydrates. Consider options for breakfast rather than cereal.  Muffin tin eggs, fresh fruit  Whole wheat toast, peanut butter, fresh fruit  Whole wheat cheese toast, fruit Consider meal planning   Grilled protein ("deck of cards"), salad, steamed or grilled vegetables  Bean soup, salad    Expected Outcomes:  Demonstrated interest in learning. Expect positive outcomes  Education material provided:   If problems or questions, patient to contact team via:  Phone and Email  Future DSME appointment: 3-4 months

## 2019-08-01 DIAGNOSIS — I10 Essential (primary) hypertension: Secondary | ICD-10-CM | POA: Diagnosis not present

## 2019-08-01 DIAGNOSIS — Z1389 Encounter for screening for other disorder: Secondary | ICD-10-CM | POA: Diagnosis not present

## 2019-08-01 DIAGNOSIS — E7800 Pure hypercholesterolemia, unspecified: Secondary | ICD-10-CM | POA: Diagnosis not present

## 2019-08-01 DIAGNOSIS — E1169 Type 2 diabetes mellitus with other specified complication: Secondary | ICD-10-CM | POA: Diagnosis not present

## 2019-08-01 DIAGNOSIS — G4700 Insomnia, unspecified: Secondary | ICD-10-CM | POA: Diagnosis not present

## 2019-08-01 DIAGNOSIS — Z0000 Encounter for general adult medical examination without abnormal findings: Secondary | ICD-10-CM | POA: Diagnosis not present

## 2019-08-01 DIAGNOSIS — F419 Anxiety disorder, unspecified: Secondary | ICD-10-CM | POA: Diagnosis not present

## 2019-08-01 DIAGNOSIS — D179 Benign lipomatous neoplasm, unspecified: Secondary | ICD-10-CM | POA: Diagnosis not present

## 2019-09-05 DIAGNOSIS — I10 Essential (primary) hypertension: Secondary | ICD-10-CM | POA: Diagnosis not present

## 2019-09-05 DIAGNOSIS — E7800 Pure hypercholesterolemia, unspecified: Secondary | ICD-10-CM | POA: Diagnosis not present

## 2019-09-05 DIAGNOSIS — R319 Hematuria, unspecified: Secondary | ICD-10-CM | POA: Diagnosis not present

## 2019-09-05 DIAGNOSIS — E1169 Type 2 diabetes mellitus with other specified complication: Secondary | ICD-10-CM | POA: Diagnosis not present

## 2019-09-20 ENCOUNTER — Other Ambulatory Visit: Payer: Self-pay | Admitting: Surgery

## 2019-09-20 DIAGNOSIS — R229 Localized swelling, mass and lump, unspecified: Secondary | ICD-10-CM

## 2019-09-24 ENCOUNTER — Other Ambulatory Visit: Payer: Self-pay | Admitting: Surgery

## 2019-09-24 ENCOUNTER — Ambulatory Visit
Admission: RE | Admit: 2019-09-24 | Discharge: 2019-09-24 | Disposition: A | Discharge status: Home / Self Care | Payer: Medicare Other | Source: Ambulatory Visit | Attending: Surgery | Admitting: Surgery

## 2019-09-24 DIAGNOSIS — R229 Localized swelling, mass and lump, unspecified: Secondary | ICD-10-CM

## 2019-09-25 NOTE — Telephone Encounter (Signed)
error 

## 2019-10-11 ENCOUNTER — Ambulatory Visit: Payer: Medicare Other | Admitting: Dietician

## 2019-10-30 DIAGNOSIS — L821 Other seborrheic keratosis: Secondary | ICD-10-CM | POA: Diagnosis not present

## 2019-10-30 DIAGNOSIS — D225 Melanocytic nevi of trunk: Secondary | ICD-10-CM | POA: Diagnosis not present

## 2019-10-30 DIAGNOSIS — L570 Actinic keratosis: Secondary | ICD-10-CM | POA: Diagnosis not present

## 2019-10-30 DIAGNOSIS — Z85828 Personal history of other malignant neoplasm of skin: Secondary | ICD-10-CM | POA: Diagnosis not present

## 2019-10-30 DIAGNOSIS — D1801 Hemangioma of skin and subcutaneous tissue: Secondary | ICD-10-CM | POA: Diagnosis not present

## 2019-10-30 DIAGNOSIS — L814 Other melanin hyperpigmentation: Secondary | ICD-10-CM | POA: Diagnosis not present

## 2019-10-30 DIAGNOSIS — D2371 Other benign neoplasm of skin of right lower limb, including hip: Secondary | ICD-10-CM | POA: Diagnosis not present

## 2019-10-30 DIAGNOSIS — D2271 Melanocytic nevi of right lower limb, including hip: Secondary | ICD-10-CM | POA: Diagnosis not present

## 2019-10-30 DIAGNOSIS — L820 Inflamed seborrheic keratosis: Secondary | ICD-10-CM | POA: Diagnosis not present

## 2020-01-21 DIAGNOSIS — H903 Sensorineural hearing loss, bilateral: Secondary | ICD-10-CM | POA: Diagnosis not present

## 2020-02-17 DIAGNOSIS — Z03818 Encounter for observation for suspected exposure to other biological agents ruled out: Secondary | ICD-10-CM | POA: Diagnosis not present

## 2020-02-17 DIAGNOSIS — J029 Acute pharyngitis, unspecified: Secondary | ICD-10-CM | POA: Diagnosis not present

## 2020-02-17 DIAGNOSIS — J22 Unspecified acute lower respiratory infection: Secondary | ICD-10-CM | POA: Diagnosis not present

## 2020-02-17 DIAGNOSIS — J309 Allergic rhinitis, unspecified: Secondary | ICD-10-CM | POA: Diagnosis not present

## 2020-03-03 ENCOUNTER — Telehealth: Payer: Self-pay

## 2020-03-03 NOTE — Telephone Encounter (Signed)
[facility] for insurance information

## 2020-04-14 ENCOUNTER — Telehealth: Payer: Self-pay

## 2020-04-14 NOTE — Telephone Encounter (Signed)
[facility] pt for insurance information to process a new pa for praluent

## 2020-04-29 DIAGNOSIS — L821 Other seborrheic keratosis: Secondary | ICD-10-CM | POA: Diagnosis not present

## 2020-04-29 DIAGNOSIS — D1801 Hemangioma of skin and subcutaneous tissue: Secondary | ICD-10-CM | POA: Diagnosis not present

## 2020-04-29 DIAGNOSIS — D485 Neoplasm of uncertain behavior of skin: Secondary | ICD-10-CM | POA: Diagnosis not present

## 2020-04-29 DIAGNOSIS — Z85828 Personal history of other malignant neoplasm of skin: Secondary | ICD-10-CM | POA: Diagnosis not present

## 2020-04-29 DIAGNOSIS — D2262 Melanocytic nevi of left upper limb, including shoulder: Secondary | ICD-10-CM | POA: Diagnosis not present

## 2020-04-29 DIAGNOSIS — D2362 Other benign neoplasm of skin of left upper limb, including shoulder: Secondary | ICD-10-CM | POA: Diagnosis not present

## 2020-04-29 DIAGNOSIS — L570 Actinic keratosis: Secondary | ICD-10-CM | POA: Diagnosis not present

## 2020-04-29 DIAGNOSIS — L858 Other specified epidermal thickening: Secondary | ICD-10-CM | POA: Diagnosis not present

## 2020-04-29 DIAGNOSIS — L814 Other melanin hyperpigmentation: Secondary | ICD-10-CM | POA: Diagnosis not present

## 2020-04-29 DIAGNOSIS — L281 Prurigo nodularis: Secondary | ICD-10-CM | POA: Diagnosis not present

## 2020-05-26 DIAGNOSIS — Z1231 Encounter for screening mammogram for malignant neoplasm of breast: Secondary | ICD-10-CM | POA: Diagnosis not present

## 2020-06-11 DIAGNOSIS — H43811 Vitreous degeneration, right eye: Secondary | ICD-10-CM | POA: Diagnosis not present

## 2020-06-25 DIAGNOSIS — F419 Anxiety disorder, unspecified: Secondary | ICD-10-CM | POA: Diagnosis not present

## 2020-06-25 DIAGNOSIS — I10 Essential (primary) hypertension: Secondary | ICD-10-CM | POA: Diagnosis not present

## 2020-06-25 DIAGNOSIS — E7800 Pure hypercholesterolemia, unspecified: Secondary | ICD-10-CM | POA: Diagnosis not present

## 2020-06-25 DIAGNOSIS — Z23 Encounter for immunization: Secondary | ICD-10-CM | POA: Diagnosis not present

## 2020-06-25 DIAGNOSIS — G4700 Insomnia, unspecified: Secondary | ICD-10-CM | POA: Diagnosis not present

## 2020-06-25 DIAGNOSIS — E119 Type 2 diabetes mellitus without complications: Secondary | ICD-10-CM | POA: Diagnosis not present

## 2020-06-26 DIAGNOSIS — H43811 Vitreous degeneration, right eye: Secondary | ICD-10-CM | POA: Diagnosis not present

## 2020-06-26 DIAGNOSIS — H5310 Unspecified subjective visual disturbances: Secondary | ICD-10-CM | POA: Diagnosis not present

## 2020-07-22 ENCOUNTER — Ambulatory Visit
Admission: RE | Admit: 2020-07-22 | Discharge: 2020-07-22 | Disposition: A | Discharge status: Home / Self Care | Payer: Medicare Other | Source: Ambulatory Visit | Attending: Physician Assistant | Admitting: Physician Assistant

## 2020-07-22 ENCOUNTER — Other Ambulatory Visit: Payer: Self-pay | Admitting: Physician Assistant

## 2020-07-22 DIAGNOSIS — R319 Hematuria, unspecified: Secondary | ICD-10-CM | POA: Diagnosis not present

## 2020-07-23 ENCOUNTER — Other Ambulatory Visit: Payer: Self-pay | Admitting: Physician Assistant

## 2020-07-23 DIAGNOSIS — R319 Hematuria, unspecified: Secondary | ICD-10-CM

## 2020-07-24 DIAGNOSIS — H5310 Unspecified subjective visual disturbances: Secondary | ICD-10-CM | POA: Diagnosis not present

## 2020-08-04 DIAGNOSIS — J029 Acute pharyngitis, unspecified: Secondary | ICD-10-CM | POA: Diagnosis not present

## 2020-08-08 ENCOUNTER — Other Ambulatory Visit: Payer: Self-pay

## 2020-08-08 ENCOUNTER — Emergency Department (HOSPITAL_BASED_OUTPATIENT_CLINIC_OR_DEPARTMENT_OTHER): Payer: Medicare Other

## 2020-08-08 ENCOUNTER — Encounter (HOSPITAL_BASED_OUTPATIENT_CLINIC_OR_DEPARTMENT_OTHER): Payer: Self-pay | Admitting: Emergency Medicine

## 2020-08-08 ENCOUNTER — Emergency Department (HOSPITAL_BASED_OUTPATIENT_CLINIC_OR_DEPARTMENT_OTHER)
Admission: EM | Admit: 2020-08-08 | Discharge: 2020-08-08 | Disposition: A | Discharge status: Home / Self Care | Payer: Medicare Other | Attending: Emergency Medicine | Admitting: Emergency Medicine

## 2020-08-08 DIAGNOSIS — R079 Chest pain, unspecified: Secondary | ICD-10-CM | POA: Diagnosis not present

## 2020-08-08 DIAGNOSIS — E119 Type 2 diabetes mellitus without complications: Secondary | ICD-10-CM | POA: Diagnosis not present

## 2020-08-08 DIAGNOSIS — Z20822 Contact with and (suspected) exposure to covid-19: Secondary | ICD-10-CM | POA: Diagnosis not present

## 2020-08-08 DIAGNOSIS — Z7951 Long term (current) use of inhaled steroids: Secondary | ICD-10-CM | POA: Diagnosis not present

## 2020-08-08 DIAGNOSIS — M62830 Muscle spasm of back: Secondary | ICD-10-CM | POA: Diagnosis not present

## 2020-08-08 DIAGNOSIS — Z79899 Other long term (current) drug therapy: Secondary | ICD-10-CM | POA: Diagnosis not present

## 2020-08-08 DIAGNOSIS — I10 Essential (primary) hypertension: Secondary | ICD-10-CM | POA: Diagnosis not present

## 2020-08-08 DIAGNOSIS — R059 Cough, unspecified: Secondary | ICD-10-CM | POA: Diagnosis not present

## 2020-08-08 DIAGNOSIS — J209 Acute bronchitis, unspecified: Secondary | ICD-10-CM | POA: Insufficient documentation

## 2020-08-08 DIAGNOSIS — R0981 Nasal congestion: Secondary | ICD-10-CM | POA: Diagnosis present

## 2020-08-08 DIAGNOSIS — R5383 Other fatigue: Secondary | ICD-10-CM | POA: Diagnosis not present

## 2020-08-08 HISTORY — DX: Type 2 diabetes mellitus without complications: E11.9

## 2020-08-08 LAB — RESPIRATORY PANEL BY RT PCR (FLU A&B, COVID)
Influenza A by PCR: NEGATIVE
Influenza B by PCR: NEGATIVE
SARS Coronavirus 2 by RT PCR: NEGATIVE

## 2020-08-08 MED ORDER — HYDROCOD POLST-CPM POLST ER 10-8 MG/5ML PO SUER: 5.0000 mL | Freq: Two times a day (BID) | ORAL | 0 refills | Status: DC | PRN

## 2020-08-08 NOTE — ED Provider Notes (Signed)
[facility] Provider Note  CSN: [id number] Arrival date & time: 08/08/20 1018    History Chief Complaint  Patient presents with  . Cough    HPI  Lindsey Jordan is a 69 y.o. female reports she has had nasal congestion, sore throat and dry cough for the last 8 days. She reports she feels SOB when her cough is bad. Has not had a fever, no loss of sense of taste or smell. She is in a trial for Covid vaccine and sent a swab in for testing earlier this week but has not gotten the results back. She went to [facility] where she was swabbed for strep (neg) but she was not checked for Covid. She reports a severe aching pain in R upper back, not worse with coughing. She has been taking OTC medications for her symptoms without much improvement.    Past Medical History:  Diagnosis Date  . Diabetes mellitus without complication ([place])   . Hypertension   . Kidney stones     Past Surgical History:  Procedure Laterality Date  . ABDOMINAL HYSTERECTOMY  2003  . ADENOIDECTOMY     age 75  . BREAST LUMPECTOMY     x 2 in her 20's  . BREAST SURGERY    . CESAREAN SECTION     one previous  . ECTOPIC PREGNANCY SURGERY    . HEMORRHOID SURGERY     in 20's  . TONSILLECTOMY      Family History  Problem Relation Age of Onset  . Hypertension Father   . Breast cancer Maternal Aunt     Social History   Tobacco Use  . Smoking status: Never Smoker  . Smokeless tobacco: Never Used  Substance Use Topics  . Alcohol use: No  . Drug use: No     Home Medications Prior to Admission medications   Medication Sig Start Date End Date Taking? Authorizing Provider  acyclovir (ZOVIRAX) 200 MG capsule Take 200 mg by mouth 3 (three) times daily. As directed    [provider]  Alirocumab (PRALUENT) 150 MG/ML SOAJ Inject 1 pen into the skin every 14 (fourteen) days. 03/29/19   Jerline Pain, MD  atenolol (TENORMIN) 100 MG tablet Take 100 mg by mouth daily.     [provider]  chlorpheniramine-HYDROcodone (TUSSIONEX PENNKINETIC ER) 10-8 MG/5ML SUER Take 5 mLs by mouth every 12 (twelve) hours as needed for cough. 08/08/20   Truddie Hidden, MD  clonazePAM (KLONOPIN) 1 MG tablet Take 1 mg by mouth as directed. 1/2-1 tablet at bedtime as needed    [provider]  Cranberry 200 MG CAPS Take by mouth daily.    [provider]  escitalopram (LEXAPRO) 5 MG tablet Take 5 mg by mouth daily.    [provider]  FLUTICASONE PROPIONATE  HFA IN Inhale into the lungs. 2 puffs each nostril daily    [provider]  Ibuprofen-diphenhydrAMINE Cit (ADVIL PM PO) Take by mouth.    [provider]  Lactobacillus (ACIDOPHILUS) TABS Take by mouth daily.    [provider]  levocetirizine (XYZAL) 2.5 MG/5ML solution Take 2.5 mg by mouth every evening.    [provider]  omega-3 acid ethyl esters (LOVAZA) 1 g capsule Take 1 capsule (1 g total) by mouth 2 (two) times daily. Patient not taking: Reported on 05/24/2019 08/08/16   Jerline Pain, MD  traZODone (DESYREL) 100 MG tablet Take 100 mg by mouth at  bedtime.    [provider]  triamterene-hydrochlorothiazide (MAXZIDE-25) 37.5-25 MG per tablet Take 1 tablet by mouth daily.    [provider]     Allergies    Biaxin [clarithromycin], Statins, and Welchol [colesevelam hcl]   Review of Systems   Review of Systems A comprehensive review of systems was completed and negative except as noted in HPI.    Physical Exam BP (!) 147/84 (BP Location: Right Arm)   Pulse (!) 55   Temp 98.7 F (37.1 C) (Oral)   Resp 18   Ht 5\' 8"  (1.727 m)   Wt 108.9 kg   LMP  (LMP Unknown)   SpO2 97%   BMI 36.49 kg/m   Physical Exam Vitals and nursing note reviewed.  Constitutional:      Appearance: Normal appearance.  HENT:     Head: Normocephalic and atraumatic.     Nose: Nose normal.     Mouth/Throat:     Mouth: Mucous membranes are  moist.  Eyes:     Extraocular Movements: Extraocular movements intact.     Conjunctiva/sclera: Conjunctivae normal.  Cardiovascular:     Rate and Rhythm: Normal rate.  Pulmonary:     Effort: Pulmonary effort is normal.     Breath sounds: Rales present.  Abdominal:     General: Abdomen is flat.     Palpations: Abdomen is soft.     Tenderness: There is no abdominal tenderness.  Musculoskeletal:        General: No swelling. Normal range of motion.     Cervical back: Neck supple.     Comments: Tenderness to palpation of soft tissue in the R scapular area  Skin:    General: Skin is warm and dry.  Neurological:     General: No focal deficit present.     Mental Status: She is alert.  Psychiatric:        Mood and Affect: Mood normal.      ED Results / Procedures / Treatments   Labs (all labs ordered are listed, but only abnormal results are displayed) Labs Reviewed  RESPIRATORY PANEL BY RT PCR (FLU A&B, COVID)    EKG None   Radiology DG Chest 2 View  Result Date: 08/08/2020 CLINICAL DATA:  Mid posterior chest pain radiating to RIGHT shoulder with cough for 2 weeks, negative COVID test, shortness of breath, fatigue, history diabetes mellitus, hypertension EXAM: CHEST - 2 VIEW COMPARISON:  10/30/2018 FINDINGS: Normal heart size, mediastinal contours, and pulmonary vascularity. Lungs clear. No pulmonary infiltrate, pleural effusion, or pneumothorax. 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 No acute osseous findings. IMPRESSION: No acute abnormalities. Electronically Signed   By: Lavonia Dana M.D.   On: 08/08/2020 12:27    Procedures Procedures  Medications Ordered in the ED Medications - No data to display   MDM Rules/Calculators/A&P MDM Patient with dry cough for 8 days, normal vitals in triage. Will check for Covid, if neg will add CXR PA/Lateral.  ED Course  I have reviewed the triage vital signs and the nursing notes.  Pertinent labs & imaging results  that were available during my care of the patient were reviewed by me and considered in my medical decision making (see chart for details).  Clinical Course as of Aug 08 1248  Sat Aug 08, 2020  1157 Covid neg. Will send for 2 view CXR.    [CS]  0938 CXR is clear. Patient with likely viral URI, no indication for Abx, will give Rx  for Tussionex for cough, advised it may make her sleepy. Continued OTC meds as needed. Offered muscle relaxer for her R upper back pain but she declines. PCP follow up if not improving.    [CS]    Clinical Course User Index [CS] Truddie Hidden, MD    Final Clinical Impression(s) / ED Diagnoses Final diagnoses:  Acute bronchitis, unspecified organism  Spasm of thoracic back muscle    Rx / DC Orders ED Discharge Orders         Ordered    chlorpheniramine-HYDROcodone (TUSSIONEX PENNKINETIC ER) 10-8 MG/5ML SUER  Every 12 hours PRN        08/08/20 1246           Truddie Hidden, MD 08/08/20 1250

## 2020-08-08 NOTE — ED Triage Notes (Addendum)
Pt c/o cough with shob x 2 weeks. Also reports fatigue. Pt seen at [facility] on tuesday, tested neg for strep, not tested for Covid. Pt ambulated to room on POx, sats 95-97%. Pt also c/o right side upper back pain, endorses concern for pneumonia.

## 2020-08-08 NOTE — Discharge Instructions (Signed)
Please continue to drink plenty of fluids and rest. You should be cautious taking the Tussionex cough syrup as it may make you drowsy. Do not mix with alcohol or other sedating medications. You can continue to take Motrin for your back pain. Try hot showers, heating pad or massage as well.

## 2020-10-06 DIAGNOSIS — G4700 Insomnia, unspecified: Secondary | ICD-10-CM | POA: Diagnosis not present

## 2020-10-06 DIAGNOSIS — Z0000 Encounter for general adult medical examination without abnormal findings: Secondary | ICD-10-CM | POA: Diagnosis not present

## 2020-10-06 DIAGNOSIS — Z1389 Encounter for screening for other disorder: Secondary | ICD-10-CM | POA: Diagnosis not present

## 2020-10-06 DIAGNOSIS — I10 Essential (primary) hypertension: Secondary | ICD-10-CM | POA: Diagnosis not present

## 2020-10-07 ENCOUNTER — Telehealth: Payer: Self-pay

## 2020-10-07 NOTE — Telephone Encounter (Signed)
NOTES ON FILE FROM [facility] [phone], SENT REFERRAL TO SCHEDULING 

## 2020-10-28 DIAGNOSIS — C44519 Basal cell carcinoma of skin of other part of trunk: Secondary | ICD-10-CM | POA: Diagnosis not present

## 2020-10-28 DIAGNOSIS — L821 Other seborrheic keratosis: Secondary | ICD-10-CM | POA: Diagnosis not present

## 2020-10-28 DIAGNOSIS — L570 Actinic keratosis: Secondary | ICD-10-CM | POA: Diagnosis not present

## 2020-10-28 DIAGNOSIS — Z85828 Personal history of other malignant neoplasm of skin: Secondary | ICD-10-CM | POA: Diagnosis not present

## 2020-10-28 DIAGNOSIS — L218 Other seborrheic dermatitis: Secondary | ICD-10-CM | POA: Diagnosis not present

## 2020-10-29 ENCOUNTER — Telehealth: Payer: Self-pay | Admitting: Cardiology

## 2020-10-29 NOTE — Telephone Encounter (Signed)
appt scheduled with Dr Marlou Porch 1/28 at 10 am.  Pt is aware and agreeable.  She will bring EKG from EMS as well.

## 2020-10-29 NOTE — Telephone Encounter (Signed)
Spoke with pt who states she feels "fine" today.  The episode she had last night lasted about 20-30 mins. EMS came and did an EKG which reportedly demonstrated At Fib.  She has a copy of this for Dr Marlou Porch to review.  This is a new diagnosis for her.  She had had 2 or 3 of these episodes in the past 5 months.  She is asking if she should be seen sooner than Tuesday 2/1 as scheduled with Dr Marlou Porch.  She is a new pt to him and does not have MyChart at this time. Advised I will have him review this information and call her back once I know something.

## 2020-10-29 NOTE — Telephone Encounter (Signed)
Lindsey Jordan is calling stating she had to call 911 last night due to her HR reaching 175. She also states she had symptoms of jaw pain, CP, and SOB. Once Kamorie got up to walk to the ambulance her symptoms had stopped so she was not hospitalized. She states this episode was scarier for her than the occurrences in the past. She is wanting to know if she can be worked in for a sooner appointment with Dr. Marlou Porch due to this. Please advise.

## 2020-10-29 NOTE — Telephone Encounter (Signed)
Let's add her on tomorrow, Friday, at 10:00am.  Thanks   Candee Furbish, MD

## 2020-10-30 ENCOUNTER — Encounter: Payer: Self-pay | Admitting: Cardiology

## 2020-10-30 ENCOUNTER — Ambulatory Visit: Payer: Medicare Other | Admitting: Cardiology

## 2020-10-30 ENCOUNTER — Other Ambulatory Visit: Payer: Self-pay

## 2020-10-30 ENCOUNTER — Telehealth: Payer: Self-pay | Admitting: *Deleted

## 2020-10-30 VITALS — BP 138/74 | HR 72 | Ht 68.0 in | Wt 253.8 lb

## 2020-10-30 DIAGNOSIS — R400 Somnolence: Secondary | ICD-10-CM | POA: Diagnosis not present

## 2020-10-30 DIAGNOSIS — I700 Atherosclerosis of aorta: Secondary | ICD-10-CM

## 2020-10-30 DIAGNOSIS — I480 Paroxysmal atrial fibrillation: Secondary | ICD-10-CM

## 2020-10-30 MED ORDER — DILTIAZEM HCL ER COATED BEADS 240 MG PO CP24: 240.0000 mg | ORAL_CAPSULE | Freq: Every day | ORAL | 3 refills | Status: DC

## 2020-10-30 MED ORDER — RIVAROXABAN 20 MG PO TABS: 20.0000 mg | ORAL_TABLET | Freq: Every day | ORAL | 6 refills | Status: DC

## 2020-10-30 MED ORDER — DILTIAZEM HCL 60 MG PO TABS: 60.0000 mg | ORAL_TABLET | Freq: Four times a day (QID) | ORAL | 99 refills | Status: DC | PRN

## 2020-10-30 MED ORDER — PRALUENT 150 MG/ML ~~LOC~~ SOAJ
1.0000 | SUBCUTANEOUS | 11 refills | Status: DC
Start: 2020-10-30 — End: 2020-11-02

## 2020-10-30 NOTE — Telephone Encounter (Signed)
Pavero, Harrell Gave, RPH  Shellia Cleverly, RN Looks like Repatha is the preferred medication this year for her plan. Her last LDL was 186 so yes this would be the med for her        Previous Messages   ----- Message -----  From: Shellia Cleverly, RN  Sent: 10/30/2020 11:40 AM EST  To: [facility]  Subject: restarting Praluent                Pt has been on Praluent in the past. Dr Marlou Porch requested she restart but also asked if one of y'all can make sure this is the best med to use for her and based on her insurance.   She is aware someone will be in touch with her.   Thank you,   Pam    Refill sent into pharmacy for pt to restart Praluent then realized Rollen Sox, [facility] stated above Reptha is the preferred medication this year.  Attempted to contact pt, left message stating refill for Praluent had been sent into pharmacy but to hold off on filling it until I clarify with Dr Marlou Porch which RX he wants to order.    Called pharmacy as asked them (left message) to hold filling this RX for now.

## 2020-10-30 NOTE — Progress Notes (Signed)
Cardiology Office Note:    Date:  10/30/2020   ID:  Lindsey Jordan, DOB 1951/04/09, MRN [id number]  PCP:  Lindsey Ada, MD  [facility] Cardiologist:  No primary care provider on file.  [facility] Electrophysiologist:  None   Referring MD: Lindsey Ada, MD    History of Present Illness:    Lindsey Jordan is a 70 y.o. female here for the evaluation of atrial fibrillation at the request of Dr. Marylynne Jordan.  According to a phone note on 10/29/2020 she called 911 secondary to a heart rate reaching 175 bpm.  She started to have jaw pain chest pain and shortness of breath at the time.  When she got up to walk to the ambulance her symptoms stopped so she was not hospitalized.  This was the worst episode that she has had.  She had asked for a sooner appointment.    EMS performed an EKG which was interpreted as atrial fibrillation.  Strips reviewed.  Image as below:     She has had 2-3 of these episodes in the past 5 months. Last episode about 20 min. When stood up converted. Mother had AFIB.   Started metformin and noticed these episodes. Walking up incline. Symtpmatic.   Interview from [name] note from 2017: She has tried Lipitor, Crestor, pravastatin (at higher doses-able to tolerate 5 mg at night), WelChol all of which cause the muscle discomfort mostly in her arms and legs. 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 She should be treated aggressively with LDL goal of at least <100 mg/dL if possible.  In August 2015 she was started on Praluent 75 mg every 2 weeks and tolerated this well.  Her father died of MI at age 63. Her mother died of CHF and had a history of atrial fibrillation and amiodarone toxicity affecting her lungs.  She had a previous lipid panel in 2015 demonstrating total  cholesterol of 317, triglycerides of 357, HDL of 41 and calculated LDL of 204.  She was then sent to the lipid clinic for further evaluation.  Labs: 02/2016: TC 322, TG 231, HDL 52, LDL 224 (off Praluent) 10/2014: TC 154, TG 204, LDL 86, HDL 44, non-HDL 109, LFTs trending up (pravastin 5mg  QHS, Praluent 75mg  every 2 weeks) 07/2014: TC 174, TG 317, LDL 89, HDL 38, non-HDL 135, LFTS are WNL (pravastatin 5mg  and Praluent 75mg  every 2 weeks) 05/2014: TC 216, TG 245, LDL 160, HDL 46, non-HDL 171, LFTs normal (pravastatin 10 mg qd) 03/2014: TC 317, TG 357, LDL 204, HDL 41, non-HDL 275, LFT normal (not on lipid lowering therapy) --- likely a falsely low LDL due to TG of 357. Her non-HDL is 275 mg/dL is very elevated!   She had excellent results to PCS K9 inhibitor however when she switched to Medicare, she had trouble affording. Patient's co-pay was $410 per month. Now it is down to $390. She states that she has decided to bite the bullet and get the medication.  Unfortunately, her mother-in-law died. She is being buried in [place]. Her husband went to [employer] in [place]. She is a Electrical engineer. Enjoys staging homes.   Past Medical History:  Diagnosis Date   Diabetes mellitus without complication ([place])    Hypertension    Kidney stones  Past Surgical History:  Procedure Laterality Date   ABDOMINAL HYSTERECTOMY  2003   ADENOIDECTOMY     age 36   BREAST LUMPECTOMY     x 2 in her 20's   BREAST SURGERY     CESAREAN SECTION     one previous   ECTOPIC PREGNANCY SURGERY     HEMORRHOID SURGERY     in 20's   TONSILLECTOMY      Current Medications: Current Meds  Medication Sig   acyclovir (ZOVIRAX) 200 MG capsule Take 200 mg by mouth 3 (three) times daily. As directed   chlorpheniramine-HYDROcodone (TUSSIONEX PENNKINETIC ER) 10-8 MG/5ML SUER Take 5 mLs by mouth every 12 (twelve) hours as needed for cough.   clonazePAM (KLONOPIN) 1 MG tablet Take 1 mg  by mouth as directed. 1/2-1 tablet at bedtime as needed   Cranberry 200 MG CAPS Take by mouth daily.   diltiazem (CARDIZEM CD) 240 MG 24 hr capsule Take 1 capsule (240 mg total) by mouth daily.   diltiazem (CARDIZEM) 60 MG tablet Take 1 tablet (60 mg total) by mouth 4 (four) times daily as needed. As needed for break through At Fib   escitalopram (LEXAPRO) 5 MG tablet Take 5 mg by mouth daily.   FLUTICASONE PROPIONATE  HFA IN Inhale into the lungs. 2 puffs each nostril daily   Ibuprofen-diphenhydrAMINE Cit (ADVIL PM PO) Take by mouth.   Lactobacillus (ACIDOPHILUS) TABS Take by mouth daily.   levocetirizine (XYZAL) 2.5 MG/5ML solution Take 2.5 mg by mouth every evening.   metFORMIN (GLUCOPHAGE) 500 MG tablet Take by mouth 2 (two) times daily with a meal.   omega-3 acid ethyl esters (LOVAZA) 1 g capsule Take 1 capsule (1 g total) by mouth 2 (two) times daily.   rivaroxaban (XARELTO) 20 MG TABS tablet Take 1 tablet (20 mg total) by mouth daily with supper.   traZODone (DESYREL) 100 MG tablet Take 100 mg by mouth at bedtime.   triamterene-hydrochlorothiazide (MAXZIDE-25) 37.5-25 MG per tablet Take 1 tablet by mouth daily.   [DISCONTINUED] Alirocumab (PRALUENT) 150 MG/ML SOAJ Inject 1 pen into the skin every 14 (fourteen) days.   [DISCONTINUED] atenolol (TENORMIN) 100 MG tablet Take 100 mg by mouth daily.     Allergies:   Biaxin [clarithromycin], Statins, and Welchol [colesevelam hcl]   Social History   Socioeconomic History   Marital status: Married    Spouse name: Not on file   Number of children: Not on file   Years of education: Not on file   Highest education level: Not on file  Occupational History   Not on file  Tobacco Use   Smoking status: Never Smoker   Smokeless tobacco: Never Used  Substance and Sexual Activity   Alcohol use: No   Drug use: No   Sexual activity: Yes    Birth control/protection: Surgical  Other Topics Concern   Not on file   Social History Narrative   Not on file   Social Determinants of Health   Financial Resource Strain: Not on file  Food Insecurity: Not on file  Transportation Needs: Not on file  Physical Activity: Not on file  Stress: Not on file  Social Connections: Not on file     Family History: The patient's family history includes Breast cancer in her maternal aunt; Hypertension in her father.  ROS:   Please see the history of present illness.     All other systems reviewed and are negative.  EKGs/Labs/Other Studies Reviewed:  The following studies were reviewed today:  EKG:  EKG is  ordered today.  The ekg ordered today demonstrates sinus rhythm 72 QTC 448  Recent Labs: No results found for requested labs within last 8760 hours.  Recent Lipid Panel    Component Value Date/Time   CHOL 175 10/10/2016 0808   TRIG 279 (H) 10/10/2016 0808   HDL 42 10/10/2016 0808   CHOLHDL 4.2 10/10/2016 0808   CHOLHDL 3.7 09/14/2015 0852   VLDL 32 (H) 09/14/2015 0852   LDLCALC 77 10/10/2016 0808   LDLDIRECT 86.0 10/15/2014 0743     Risk Assessment/Calculations:    CHA2DS2-VASc Score = 5  This indicates a 7.2% annual risk of stroke. The patient's score is based upon: CHF History: No HTN History: Yes Diabetes History: Yes Stroke History: No Vascular Disease History: Yes Age Score: 1 Gender Score: 1      Physical Exam:    VS:  BP 138/74    Pulse 72    Ht 5\' 8"  (1.727 m)    Wt 253 lb 12.8 oz (115.1 kg)    LMP  (LMP Unknown)    SpO2 94%    BMI 38.59 kg/m     Wt Readings from Last 3 Encounters:  10/30/20 253 lb 12.8 oz (115.1 kg)  08/08/20 240 lb (108.9 kg)  07/29/19 251 lb (113.9 kg)     GEN:  Well nourished, well developed in no acute distress HEENT: Normal NECK: No JVD; No carotid bruits LYMPHATICS: No lymphadenopathy CARDIAC: RRR, no murmurs, rubs, gallops RESPIRATORY:  Clear to auscultation without rales, wheezing or rhonchi  ABDOMEN: Soft, non-tender,  non-distended MUSCULOSKELETAL:  No edema; No deformity  SKIN: Warm and dry NEUROLOGIC:  Alert and oriented x 3 PSYCHIATRIC:  Normal affect   ASSESSMENT:    1. Paroxysmal atrial fibrillation (HCC)   2. Daytime somnolence   3. Aortic atherosclerosis ([place])   4. Morbid obesity ([place])    PLAN:    In order of problems listed above:  Paroxysmal atrial fibrillation -EKG from EMS does demonstrate atrial fibrillation heart rate in the 130s.  She states that the duration was about 20 minutes when she stood up it normalized.  She has had a couple of these episodes and they have been highly symptomatic to her.  She even felt some chest discomfort shortness of breath. -Check echocardiogram to ensure proper structure and function of her heart -Start Xarelto 20 mg once a day.  Discussed bleeding risk.  Asked her to try to decrease her Advil PM. -Check sleep study.  Daytime somnolence.  Atrial fibrillation. -Change her atenolol 100 mg over to diltiazem 240 mg CD once a day.  Also give her short acting diltiazem 60 mg to take as needed rapid heartbeat. -We will refer to EP for potential ablation given her symptomatic paroxysmal atrial fibrillation. -Hemoglobin 14.4 creatinine 0.7 potassium 4.7 ALT 24 TSH 1.23 all from outside labs on 10/06/2020.  Aortic atherosclerosis -Personally reviewed on CT scan of abdomen pelvis 07/22/2020. -No aspirin because of Xarelto.  Resuming PCSK9 inhibitor.  Mild elevation of left hemidiaphragm -Personally reviewed and noted on chest x-ray from 08/08/2020.  Hyperlipidemia with statin intolerance -Had been on injectable in the past. She is wishing to restart.  She used to be on Praluent.  She is willing to restart.  I will have her talk with the pharmacy team about preauthorization.  Also has Lovaza for hypertriglyceridemia. -On 10/06/2020-total cholesterol 304, HDL 38 LDL 186 triglycerides 398. -Hemoglobin A1c 6.2-she has  been started on Metformin.  Was diagnosed with type  2 diabetes.  Weight loss.  Morbid obesity -BMI greater than 35 with two or more comorbidities.  Continue to encourage weight loss.  Decrease carbohydrates.  We will see her back in 3 months.      Medication Adjustments/Labs and Tests Ordered: Current medicines are reviewed at length with the patient today.  Concerns regarding medicines are outlined above.  Orders Placed This Encounter  Procedures   Ambulatory referral to Cardiac Electrophysiology   EKG 12-Lead   ECHOCARDIOGRAM COMPLETE   Split night study   Meds ordered this encounter  Medications   rivaroxaban (XARELTO) 20 MG TABS tablet    Sig: Take 1 tablet (20 mg total) by mouth daily with supper.    Dispense:  30 tablet    Refill:  6   diltiazem (CARDIZEM CD) 240 MG 24 hr capsule    Sig: Take 1 capsule (240 mg total) by mouth daily.    Dispense:  90 capsule    Refill:  3   diltiazem (CARDIZEM) 60 MG tablet    Sig: Take 1 tablet (60 mg total) by mouth 4 (four) times daily as needed. As needed for break through At Fib    Dispense:  30 tablet    Refill:  prn    Patient Instructions  Medication Instructions:  Please discontinue Atenolol. Start Diltiazem CD 240 mg daily. You may take Diltiazem 60 mg as needed for break through At Fib. Start Xarelto 20 mg a day. Continue all other medications as listed.  We will have a pharmacy team contact you about restarting Praluent.  *If you need a refill on your cardiac medications before your next appointment, please call your pharmacy*  Testing/Procedures: You have been referred to our Electrophysiology Department to discuss possible At Fibrillation Ablation.  Your physician has requested that you have an echocardiogram. Echocardiography is a painless test that uses sound waves to create images of your heart. It provides your doctor with information about the size and shape of your heart and how well your hearts chambers and valves are working. This procedure takes  approximately one hour. There are no restrictions for this procedure.  Your physician has recommended that you have a sleep study. This test records several body functions during sleep, including: brain activity, eye movement, oxygen and carbon dioxide blood levels, heart rate and rhythm, breathing rate and rhythm, the flow of air through your mouth and nose, snoring, body muscle movements, and chest and belly movement.  You will be contacted to be scheduled for this.  Follow-Up: At [facility], you and your health needs are our priority.  As part of our continuing mission to provide you with exceptional heart care, we have created designated Provider Care Teams.  These Care Teams include your primary Cardiologist (physician) and Advanced Practice Providers (APPs -  Physician Assistants and Nurse Practitioners) who all work together to provide you with the care you need, when you need it.  We recommend signing up for the patient portal called "MyChart".  Sign up information is provided on this After Visit Summary.  MyChart is used to connect with patients for Virtual Visits (Telemedicine).  Patients are able to view lab/test results, encounter notes, upcoming appointments, etc.  Non-urgent messages can be sent to your provider as well.   To learn more about what you can do with MyChart, go to [email or web address].    Your next appointment:   3 month(s)  The format for  your next appointment:   In Person  Provider:   Candee Furbish, MD   Thank you for choosing [facility]!!        Signed, Candee Furbish, MD  10/30/2020 4:29 PM    [place]

## 2020-10-30 NOTE — Patient Instructions (Addendum)
Medication Instructions:  Please discontinue Atenolol. Start Diltiazem CD 240 mg daily. You may take Diltiazem 60 mg as needed for break through At Fib. Start Xarelto 20 mg a day. Continue all other medications as listed.  We will have a pharmacy team contact you about restarting Praluent.  *If you need a refill on your cardiac medications before your next appointment, please call your pharmacy*  Testing/Procedures: You have been referred to our Electrophysiology Department to discuss possible At Fibrillation Ablation.  Your physician has requested that you have an echocardiogram. Echocardiography is a painless test that uses sound waves to create images of your heart. It provides your doctor with information about the size and shape of your heart and how well your heart's chambers and valves are working. This procedure takes approximately one hour. There are no restrictions for this procedure.  Your physician has recommended that you have a sleep study. This test records several body functions during sleep, including: brain activity, eye movement, oxygen and carbon dioxide blood levels, heart rate and rhythm, breathing rate and rhythm, the flow of air through your mouth and nose, snoring, body muscle movements, and chest and belly movement.  You will be contacted to be scheduled for this.  Follow-Up: At [facility], you and your health needs are our priority.  As part of our continuing mission to provide you with exceptional heart care, we have created designated Provider Care Teams.  These Care Teams include your primary Cardiologist (physician) and Advanced Practice Providers (APPs -  Physician Assistants and Nurse Practitioners) who all work together to provide you with the care you need, when you need it.  We recommend signing up for the patient portal called "MyChart".  Sign up information is provided on this After Visit Summary.  MyChart is used to connect with patients for Virtual  Visits (Telemedicine).  Patients are able to view lab/test results, encounter notes, upcoming appointments, etc.  Non-urgent messages can be sent to your provider as well.   To learn more about what you can do with MyChart, go to [email or web address].    Your next appointment:   3 month(s)  The format for your next appointment:   In Person  Provider:   Candee Furbish, MD   Thank you for choosing [facility]!!

## 2020-11-02 ENCOUNTER — Encounter: Payer: Self-pay | Admitting: *Deleted

## 2020-11-02 ENCOUNTER — Ambulatory Visit: Payer: Medicare Other | Admitting: Internal Medicine

## 2020-11-02 ENCOUNTER — Encounter: Payer: Self-pay | Admitting: Internal Medicine

## 2020-11-02 ENCOUNTER — Telehealth: Payer: Self-pay | Admitting: *Deleted

## 2020-11-02 ENCOUNTER — Other Ambulatory Visit: Payer: Self-pay

## 2020-11-02 VITALS — BP 136/64 | HR 66 | Ht 68.0 in | Wt 253.0 lb

## 2020-11-02 DIAGNOSIS — I10 Essential (primary) hypertension: Secondary | ICD-10-CM

## 2020-11-02 DIAGNOSIS — I208 Other forms of angina pectoris: Secondary | ICD-10-CM

## 2020-11-02 DIAGNOSIS — I480 Paroxysmal atrial fibrillation: Secondary | ICD-10-CM | POA: Diagnosis not present

## 2020-11-02 MED ORDER — REPATHA SURECLICK 140 MG/ML ~~LOC~~ SOAJ: 1.0000 "pen " | SUBCUTANEOUS | 3 refills | Status: DC

## 2020-11-02 NOTE — Progress Notes (Signed)
Electrophysiology Office Note   Date:  11/02/2020   ID:  Lindsey Jordan, DOB [birth year], MRN [id number]  PCP:  Chiquitta Ada, MD  Cardiologist:  Dr Marlou Porch Primary Electrophysiologist: Thompson Grayer, MD    CC: afib   History of Present Illness: Lindsey Jordan is a 70 y.o. female who presents today for electrophysiology evaluation.   She is referred by Dr Marlou Porch for EP consultation regarding afib. The patient developed tachypalpitations 10/29/20 and was found to have afib with RVR.  She has had 2-3 similar episodes over the past 6 months.  Episodes typically last < 1 hour.  She has symptoms of tachypalpitations and fatigue.  Unaware of triggers/ precipitants.  She also reports having chest heaviness and jaw pain during her afib. She has been started on xarelto for stroke prevention.  Today, she denies symptoms of palpitations, chest pain, shortness of breath, orthopnea, PND, lower extremity edema, claudication, dizziness, presyncope, syncope, bleeding, or neurologic sequela. The patient is tolerating medications without difficulties and is otherwise without complaint today.    Past Medical History:  Diagnosis Date  . Diabetes mellitus without complication ([place])   . Hypertension   . Kidney stones   . Morbid obesity ([place])   . Paroxysmal atrial fibrillation [facility])    Past Surgical History:  Procedure Laterality Date  . ABDOMINAL HYSTERECTOMY  2003  . ADENOIDECTOMY     age 16  . BREAST LUMPECTOMY     x 2 in her 20's  . BREAST SURGERY    . CESAREAN SECTION     one previous  . ECTOPIC PREGNANCY SURGERY    . HEMORRHOID SURGERY     in 20's  . TONSILLECTOMY       Current Outpatient Medications  Medication Sig Dispense Refill  . acyclovir (ZOVIRAX) 200 MG capsule Take 200 mg by mouth 3 (three) times daily. As directed    . Cranberry 200 MG CAPS Take by mouth daily.    Marland Kitchen diltiazem (CARDIZEM CD) 240 MG 24 hr capsule Take 1 capsule (240 mg total) by mouth daily. 90 capsule 3   . diltiazem (CARDIZEM) 60 MG tablet Take 1 tablet (60 mg total) by mouth 4 (four) times daily as needed. As needed for break through At Fib 30 tablet prn  . escitalopram (LEXAPRO) 5 MG tablet Take 5 mg by mouth daily.    Marland Kitchen FLUTICASONE PROPIONATE  HFA IN Inhale into the lungs. 2 puffs each nostril daily    . Ibuprofen-diphenhydrAMINE Cit (ADVIL PM PO) Take by mouth.    . levocetirizine (XYZAL) 2.5 MG/5ML solution Take 2.5 mg by mouth every evening.    . metFORMIN (GLUCOPHAGE) 500 MG tablet Take by mouth 2 (two) times daily with a meal.    . rivaroxaban (XARELTO) 20 MG TABS tablet Take 1 tablet (20 mg total) by mouth daily with supper. 30 tablet 6  . traZODone (DESYREL) 100 MG tablet Take 100 mg by mouth at bedtime.    . triamterene-hydrochlorothiazide (MAXZIDE-25) 37.5-25 MG per tablet Take 1 tablet by mouth daily.     No current facility-administered medications for this visit.    Allergies:   Biaxin [clarithromycin], Statins, and Welchol [colesevelam hcl]   Social History:  The patient  reports that she has never smoked. She has never used smokeless tobacco. She reports that she does not drink alcohol and does not use drugs.   Family History:  The patient's family history includes Breast cancer in her maternal aunt; Hypertension in her father.  ROS:  Please see the history of present illness.   All other systems are personally reviewed and negative.    PHYSICAL EXAM: VS:  BP 136/64   Pulse 66   Ht 5\' 8"  (1.727 m)   Wt 253 lb (114.8 kg)   LMP  (LMP Unknown)   SpO2 96%   BMI 38.47 kg/m  , BMI Body mass index is 38.47 kg/m. GEN: Well nourished, well developed, in no acute distress HEENT: normal Neck: no JVD  Cardiac: RRR; no murmurs, rubs, or gallops,no edema  Respiratory:  clear to auscultation bilaterally, normal work of breathing GI: soft, nontender, nondistended, + BS MS: no deformity or atrophy Skin: warm and dry  Neuro:  Strength and sensation are intact Psych:  euthymic mood, full affect  EKG:  EKG is ordered today. The ekg ordered today is personally reviewed and shows sinus rhythm   Recent Labs: No results found for requested labs within last 8760 hours.  personally reviewed   Lipid Panel     Component Value Date/Time   CHOL 175 10/10/2016 0808   TRIG 279 (H) 10/10/2016 0808   HDL 42 10/10/2016 0808   CHOLHDL 4.2 10/10/2016 0808   CHOLHDL 3.7 09/14/2015 0852   VLDL 32 (H) 09/14/2015 0852   LDLCALC 77 10/10/2016 0808   LDLDIRECT 86.0 10/15/2014 0743   personally reviewed   Wt Readings from Last 3 Encounters:  11/02/20 253 lb (114.8 kg)  10/30/20 253 lb 12.8 oz (115.1 kg)  08/08/20 240 lb (108.9 kg)      Other studies personally reviewed: Additional studies/ records that were reviewed today include: echo pending,   Dr Marlou Porch notes  Review of the above records today demonstrates: as above   ASSESSMENT AND PLAN:  1.  Paroxysmal atrial fibrillation The patient has symptomatic, recurrent paroxysmal atrial fibrillation. she has not tried AAD therapy.  She has recently started diltiazem Chads2vasc score is 4.  she is anticoagulated with eliquis . Today, we discussed lifestyle modification extensive including regular exercise and weight reduction strategies. Echo is pending.  Sleep study pending I have discussed importance of regular exercise and weight reduction today. We could consider flecainide vs ablation if her afib progression despite lifestyle modification.  I will refer to the AF clinic for further discussions.  2. Morbid obesity Body mass index is 38.47 kg/m. Lifestyle modification is advised  3. HL Continue current therapy  4. HTN Stable No change required today  5. 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 Jaw/ chest pain during AF I would advise further CV risk stratification lexiscan myoview is ordered today   Risks, benefits and potential toxicities for  medications prescribed and/or refilled reviewed with patient today.    Follow-up:  AF clinic in 6 weeks I will see again in 3 months  Current medicines are reviewed at length with the patient today.   The patient does not have concerns regarding her medicines.  The following changes were made today:  none  Labs/ tests ordered today include:  Orders Placed This Encounter  Procedures  . EKG 12-Lead     Signed, Thompson Grayer, MD  11/02/2020 3:35 PM     [facility] [address] [place] [address] [phone] (office) [phone] (fax)

## 2020-11-02 NOTE — Telephone Encounter (Signed)
Repatha approved. Patient made aware. Offered to apply for [employer] grant. Patient very appreciative. She will call me back with income so I can apply for her.

## 2020-11-02 NOTE — Telephone Encounter (Signed)
Staff message sent to Gae Bon ok to schedule sleep study. Per web portal and plan no PA is required.

## 2020-11-02 NOTE — Telephone Encounter (Signed)
Sounds great.  Lets proceed with Repatha.  Gerald Stabs can you help out with the dosing etc.  Thank you Candee Furbish, MD

## 2020-11-02 NOTE — Patient Instructions (Addendum)
Medication Instructions:  Your physician recommends that you continue on your current medications as directed. Please refer to the Current Medication list given to you today.  *If you need a refill on your cardiac medications before your next appointment, please call your pharmacy*   Lab Work: None ordered If you have labs (blood work) drawn today and your tests are completely normal, you will receive your results only by: Marland Kitchen MyChart Message (if you have MyChart) OR . A paper copy in the mail If you have any lab test that is abnormal or we need to change your treatment, we will call you to review the results.   Testing/Procedures:  Your physician has requested that you have a lexiscan myoview. For further information please visit [email or web address]. Please follow instruction sheet, as given.-- see letter  You have been referred to Dr Carlena Sax for treatment of possible sleep apnea       Follow-Up: At [facility], you and your health needs are our priority.  As part of our continuing mission to provide you with exceptional heart care, we have created designated Provider Care Teams.  These Care Teams include your primary Cardiologist (physician) and Advanced Practice Providers (APPs -  Physician Assistants and Nurse Practitioners) who all work together to provide you with the care you need, when you need it.  We recommend signing up for the patient portal called "MyChart".  Sign up information is provided on this After Visit Summary.  MyChart is used to connect with patients for Virtual Visits (Telemedicine).  Patients are able to view lab/test results, encounter notes, upcoming appointments, etc.  Non-urgent messages can be sent to your provider as well.   To learn more about what you can do with MyChart, go to [email or web address].     Your physician recommends that you schedule a follow-up appointment in: in 6 weeks with the afib clinic and 3 months with Dr.  Rayann Heman      Thank you for choosing [facility]!!

## 2020-11-02 NOTE — Telephone Encounter (Signed)
PA for Repatha 140mg  q 14 days submitted to insurance. Awaiting determination.

## 2020-11-03 ENCOUNTER — Ambulatory Visit: Payer: No Typology Code available for payment source | Admitting: Cardiology

## 2020-11-03 NOTE — Telephone Encounter (Signed)
[place] approved  Pharmacy Card Id [id number] Group [id number] PCN [id number] BIN [id number]

## 2020-11-04 NOTE — Telephone Encounter (Signed)
[employer] info called into pharmacy. Left message for pt to make her aware.

## 2020-11-06 ENCOUNTER — Telehealth: Payer: Self-pay | Admitting: *Deleted

## 2020-11-06 DIAGNOSIS — M170 Bilateral primary osteoarthritis of knee: Secondary | ICD-10-CM | POA: Diagnosis not present

## 2020-11-06 NOTE — Telephone Encounter (Signed)
Patient is scheduled for lab study on 12/21/20. Patient understands her sleep study will be done at [facility] sleep lab. Patient understands she will receive a sleep packet in a week or so. Patient understands to call if she does not receive the sleep packet in a timely manner. Patient agrees with treatment and thanked me for call.

## 2020-11-06 NOTE — Telephone Encounter (Signed)
-----   Message from Lauralee Evener, [place] sent at 11/02/2020 12:30 PM EST ----- Regarding: RE: [place] to schedule sleep study. No PA is required. ----- Message ----- From: Philbert Riser Sent: 10/30/2020  11:34 AM EST To: Cv Div Sleep Studies Subject: SLEEP STUDY                                    Per Dr.Skains.  Thanks

## 2020-11-11 ENCOUNTER — Telehealth (HOSPITAL_COMMUNITY): Payer: Self-pay | Admitting: *Deleted

## 2020-11-11 NOTE — Telephone Encounter (Signed)
Patient given detailed instructions per Myocardial Perfusion Study Information Sheet for the test on 11/16/20. Patient notified to arrive 15 minutes early and that it is imperative to arrive on time for appointment to keep from having the test rescheduled.  If you need to cancel or reschedule your appointment, please call the office within 24 hours of your appointment. . Patient verbalized understanding. Kirstie Peri

## 2020-11-12 DIAGNOSIS — R229 Localized swelling, mass and lump, unspecified: Secondary | ICD-10-CM | POA: Diagnosis not present

## 2020-11-16 ENCOUNTER — Other Ambulatory Visit: Payer: Self-pay

## 2020-11-16 ENCOUNTER — Ambulatory Visit (HOSPITAL_COMMUNITY): Payer: Medicare Other | Attending: Cardiology

## 2020-11-16 DIAGNOSIS — I208 Other forms of angina pectoris: Secondary | ICD-10-CM | POA: Insufficient documentation

## 2020-11-16 MED ORDER — TECHNETIUM TC 99M TETROFOSMIN IV KIT
31.7000 | PACK | Freq: Once | INTRAVENOUS | Status: AC | PRN
  Administered 2020-11-16: 31.7 via INTRAVENOUS
  Filled 2020-11-16: qty 32

## 2020-11-16 MED ORDER — REGADENOSON 0.4 MG/5ML IV SOLN
0.4000 mg | Freq: Once | INTRAVENOUS | Status: AC
  Administered 2020-11-16: 0.4 mg via INTRAVENOUS

## 2020-11-17 ENCOUNTER — Ambulatory Visit (HOSPITAL_COMMUNITY): Payer: Medicare Other | Attending: Cardiovascular Disease

## 2020-11-17 LAB — MYOCARDIAL PERFUSION IMAGING
LV dias vol: 85 mL (ref 46–106)
LV sys vol: 27 mL
Peak HR: 104 {beats}/min
Rest HR: 78 {beats}/min
SDS: 5
SRS: 5
SSS: 12
TID: 1.02

## 2020-11-17 MED ORDER — TECHNETIUM TC 99M TETROFOSMIN IV KIT
31.5200 | PACK | Freq: Once | INTRAVENOUS | Status: AC | PRN
  Administered 2020-11-17: 31.52 via INTRAVENOUS
  Filled 2020-11-17: qty 32

## 2020-11-18 ENCOUNTER — Ambulatory Visit (HOSPITAL_COMMUNITY): Payer: Medicare Other | Attending: Cardiology

## 2020-11-18 ENCOUNTER — Telehealth: Payer: Self-pay | Admitting: *Deleted

## 2020-11-18 ENCOUNTER — Telehealth: Payer: Self-pay

## 2020-11-18 ENCOUNTER — Other Ambulatory Visit: Payer: Self-pay

## 2020-11-18 ENCOUNTER — Encounter: Payer: Self-pay | Admitting: *Deleted

## 2020-11-18 DIAGNOSIS — I480 Paroxysmal atrial fibrillation: Secondary | ICD-10-CM

## 2020-11-18 LAB — ECHOCARDIOGRAM COMPLETE
Area-P 1/2: 2.13 cm2
P 1/2 time: 568 msec
S' Lateral: 2.7 cm

## 2020-11-18 MED ORDER — ASPIRIN EC 81 MG PO TBEC: 81.0000 mg | DELAYED_RELEASE_TABLET | Freq: Every day | ORAL | 3 refills | Status: AC

## 2020-11-18 MED ORDER — NITROGLYCERIN 0.4 MG SL SUBL: 0.4000 mg | SUBLINGUAL_TABLET | SUBLINGUAL | 3 refills | Status: DC | PRN

## 2020-11-18 NOTE — Telephone Encounter (Signed)
Called pt to give her results, per MD. She has some further questions due to being told today that she is to have cardiac cath scheduled for Monday. I have asked Dr. Jackalyn Lombard nurse to call her to try to address her questions. Pt is aware to expect a call back from [place] today.

## 2020-11-18 NOTE — Telephone Encounter (Signed)
The patient has been notified of the result and verbalized understanding.  All questions (if any) were answered. Darrell Jewel, RN 11/18/2020 11:09 AM spoke to patient & husband for ~30 mins.  Set up [place], pre op screening, appointment with Dr. Rayann Heman Friday, OTC ASA 81 mg daily, order for sl NTG. Will get labs Friday and give instruction letter.

## 2020-11-18 NOTE — Telephone Encounter (Signed)
-----   Message from Thompson Grayer, MD sent at 11/17/2020  4:45 PM EST ----- Results reviewed.  Otila Kluver, please inform pt of high risk result. I will route to primary care also.  Otila Kluver, Please schedule for left heart catheterization with possible PCI at the next available time. Ok to add to my schedule for this Friday for me to discuss myoview findings with her. Start ASA 81mg  daily.  Make sure she has sl NTG available.  Will need to hold xarelto 48 hours prior to cath.

## 2020-11-20 ENCOUNTER — Ambulatory Visit: Payer: Medicare Other | Admitting: Internal Medicine

## 2020-11-20 ENCOUNTER — Encounter: Payer: Self-pay | Admitting: Internal Medicine

## 2020-11-20 ENCOUNTER — Other Ambulatory Visit (HOSPITAL_COMMUNITY)
Admission: RE | Admit: 2020-11-20 | Discharge: 2020-11-20 | Disposition: A | Discharge status: Home / Self Care | Payer: Medicare Other | Source: Ambulatory Visit | Attending: Interventional Cardiology | Admitting: Interventional Cardiology

## 2020-11-20 ENCOUNTER — Other Ambulatory Visit: Payer: Self-pay

## 2020-11-20 VITALS — BP 144/82 | HR 78 | Ht 68.0 in | Wt 248.2 lb

## 2020-11-20 DIAGNOSIS — Z01812 Encounter for preprocedural laboratory examination: Secondary | ICD-10-CM | POA: Diagnosis not present

## 2020-11-20 DIAGNOSIS — R079 Chest pain, unspecified: Secondary | ICD-10-CM | POA: Diagnosis not present

## 2020-11-20 DIAGNOSIS — Z20822 Contact with and (suspected) exposure to covid-19: Secondary | ICD-10-CM | POA: Insufficient documentation

## 2020-11-20 DIAGNOSIS — I480 Paroxysmal atrial fibrillation: Secondary | ICD-10-CM | POA: Diagnosis not present

## 2020-11-20 DIAGNOSIS — R0683 Snoring: Secondary | ICD-10-CM

## 2020-11-20 DIAGNOSIS — I10 Essential (primary) hypertension: Secondary | ICD-10-CM | POA: Diagnosis not present

## 2020-11-20 NOTE — Telephone Encounter (Signed)
Called patient and let her know that Dr. Marlou Porch still recommended a heart cath after reviewing both test. I reassured her that Dr. Rayann Heman will be happy to answer all her questions today during her appointment.  Patient verbalized understanding and grateful for the update.

## 2020-11-20 NOTE — Telephone Encounter (Signed)
Even though ECHO showed normal pump function, NUC stress test was high risk and suggestive of coronary artery disease. She still needs heart cath.   Thanks  Candee Furbish, MD

## 2020-11-20 NOTE — Progress Notes (Signed)
PCP: Ronette Ada, MD Primary Cardiologist: Dr Marlou Porch Primary EP: Dr Guss Bunde is a 70 y.o. female who presents today for urgent electrophysiology followup.  Since last being seen in our clinic, the patient reports doing reasonably well.  She had stress test ordered by me recently due to symptoms of fatigue, SOB, chest heaviness and jaw pain.  Her myoview was high risk.  I have therefore brought her in today to consider cath.  Today, she denies symptoms of palpitations,   lower extremity edema, dizziness, presyncope, or syncope.  The patient is otherwise without complaint today.   Past Medical History:  Diagnosis Date  . Diabetes mellitus without complication ([place])   . Hypertension   . Kidney stones   . Morbid obesity ([place])   . Paroxysmal atrial fibrillation [facility])    Past Surgical History:  Procedure Laterality Date  . ABDOMINAL HYSTERECTOMY  2003  . ADENOIDECTOMY     age 2  . BREAST LUMPECTOMY     x 2 in her 20's  . BREAST SURGERY    . CESAREAN SECTION     one previous  . ECTOPIC PREGNANCY SURGERY    . HEMORRHOID SURGERY     in 20's  . TONSILLECTOMY      ROS- all systems are reviewed and negatives except as per HPI above  Current Outpatient Medications  Medication Sig Dispense Refill  . acyclovir (ZOVIRAX) 200 MG capsule Take 200 mg by mouth See admin instructions. Take 1 capsule (200 mg) by mouth in the morning & take 1 capsule (200 mg) by mouth 3 times daily if outbreak presents    . aspirin EC 81 MG tablet Take 1 tablet (81 mg total) by mouth daily. Swallow whole. 90 tablet 3  . clonazePAM (KLONOPIN) 1 MG tablet Take 1 mg by mouth at bedtime.    Marland Kitchen diltiazem (CARDIZEM CD) 240 MG 24 hr capsule Take 1 capsule (240 mg total) by mouth daily. 90 capsule 3  . diltiazem (CARDIZEM) 60 MG tablet Take 1 tablet (60 mg total) by mouth 4 (four) times daily as needed. As needed for break through At Fib 30 tablet prn  . escitalopram (LEXAPRO) 10 MG tablet Take 10 mg  by mouth at bedtime.    . Evolocumab (REPATHA SURECLICK) 470 MG/ML SOAJ Inject 1 pen into the skin every 14 (fourteen) days. 6 mL 3  . fluticasone (FLONASE) 50 MCG/ACT nasal spray Place 1 spray into both nostrils daily.    . Ibuprofen-diphenhydrAMINE HCl (ADVIL PM) 200-25 MG CAPS Take 3 tablets by mouth at bedtime.    Marland Kitchen levocetirizine (XYZAL) 5 MG tablet Take 5 mg by mouth every evening.    . metFORMIN (GLUCOPHAGE-XR) 500 MG 24 hr tablet Take 500 mg by mouth every evening.    . nitroGLYCERIN (NITROSTAT) 0.4 MG SL tablet Place 1 tablet (0.4 mg total) under the tongue every 5 (five) minutes x 3 doses as needed for chest pain. 15 tablet 3  . rivaroxaban (XARELTO) 20 MG TABS tablet Take 1 tablet (20 mg total) by mouth daily with supper. 30 tablet 6  . traZODone (DESYREL) 100 MG tablet Take 100 mg by mouth at bedtime.    . triamterene-hydrochlorothiazide (MAXZIDE-25) 37.5-25 MG per tablet Take 1 tablet by mouth daily.     No current facility-administered medications for this visit.    Physical Exam: Vitals:   11/20/20 1238  BP: (!) 144/82  Pulse: 78  SpO2: 97%  Weight: 248 lb 3.2 oz (  112.6 kg)  Height: 5\' 8"  (1.727 m)    GEN- The patient is well appearing, alert and oriented x 3 today.   Head- normocephalic, atraumatic Eyes-  Sclera clear, conjunctiva pink Ears- hearing intact Oropharynx- clear Lungs- Clear to ausculation bilaterally, normal work of breathing Heart- Regular rate and rhythm, no murmurs, rubs or gallops, PMI not laterally displaced GI- soft, NT, ND, + BS Extremities- no clubbing, cyanosis, or edema  Wt Readings from Last 3 Encounters:  11/20/20 248 lb 3.2 oz (112.6 kg)  11/16/20 253 lb (114.8 kg)  11/02/20 253 lb (114.8 kg)    EKG tracing ordered today is personally reviewed and shows sinus rhythm.  recent myoview and echo are reviewed and discussed with Dr Marlou Porch. We agree that cath is indicated.   Assessment and Plan:  1. Jaw pain/ chest pain during  afib Recent high risk myoview reviewed at length with the patient. I would therefore recommend left heart catheterization with possible PCI.  Discussed the cath with the patient. The patient understands that risks included but are not limited to stroke (1 in 1000), death (1 in 109), kidney failure [usually temporary] (1 in 500), bleeding (1 in 200), allergic reaction [possibly serious] (1 in 200). The patient understands and agrees to proceed.   We have scheduled for next month.  She is instructed to hold xarelto prior to cath.  2. afib We will reassess after CAD evaluation. If she were to have surgical disease, she should have MAZE at time of her CABG. Hold xarelto for cath (as above)  3. HTN Stable No change required today  4. 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   Return to see me in 3 months  Thompson Grayer MD, [facility] 11/20/2020 1:11 PM

## 2020-11-20 NOTE — Patient Instructions (Addendum)
Medication Instructions:  Your physician recommends that you continue on your current medications as directed. Please refer to the Current Medication list given to you today.  Labwork: Cbc,bmp  Testing/Procedures: None ordered.  Follow-Up: Your physician wants you to follow-up in: one year with Thompson Grayer, MD or one of the following Advanced Practice Providers on your designated Care Team:    Chanetta Marshall, NP  Tommye Standard, PA-C  Legrand Como "Granger" Dewey, [place]   You will receive a reminder letter in the mail two months in advance. If you don't receive a letter, please call our office to schedule the follow-up appointment.  Any Other Special Instructions Will Be Listed Below (If Applicable).  If you need a refill on your cardiac medications before your next appointment, please call your pharmacy.    Angiogram  An angiogram is a procedure used to examine the blood vessels. In this procedure, contrast dye is injected through a long, thin tube (catheter) into an artery. X-rays are then taken, which show if there is a blockage or problem in a blood vessel. The catheter may be inserted in:  The groin area. This is the most common.  The fold of the arm, near the elbow.  The wrist. Tell a health care provider about:  Any allergies you have, including allergies to medicines, shellfish, contrast dye, or iodine.  All medicines you are taking, including vitamins, herbs, eye drops, creams, and over-the-counter medicines.  Any problems you or family members have had with anesthetic medicines.  Any blood disorders you have.  Any surgeries you have had.  Any medical conditions you have or have had, including any kidney problems or kidney failure.  Whether you are pregnant or may be pregnant.  Whether you are breastfeeding. What are the risks? Generally, this is a safe procedure. However, problems may occur, including:  Infection.  Bleeding and bruising.  Allergic reactions to  medicines or dyes, including the contrast dye used.  Damage to nearby structures or organs, including damage to blood vessels and kidney damage from the contrast dye.  Blood clots that can lead to a stroke or heart attack. What happens before the procedure? Staying hydrated Follow instructions from your health care provider about hydration, which may include:  Up to 2 hours before the procedure - you may continue to drink clear liquids, such as water, clear fruit juice, black coffee, and plain tea.   Eating and drinking restrictions Follow instructions from your health care provider about eating and drinking, which may include:  8 hours before the procedure - stop eating heavy meals or foods, such as meat, fried foods, or fatty foods.  6 hours before the procedure - stop eating light meals or foods, such as toast or cereal.  2 hours before the procedure - stop drinking clear liquids. Medicines Ask your health care provider about:  Changing or stopping your regular medicines. This is especially important if you are taking diabetes medicines or blood thinners.  Taking medicines such as aspirin and ibuprofen. These medicines can thin your blood. Do not take these medicines unless your health care provider tells you to take them.  Taking over-the-counter medicines, vitamins, herbs, and supplements. Surgery safety Ask your health care provider:  How your insertion site will be marked.  What steps will be taken to help prevent infection. These may include: ? Removing hair at the insertion site. ? Washing skin with a germ-killing soap. ? Taking antibiotic medicine. General instructions  Do not use any products that  contain nicotine or tobacco for at least 4 weeks before the procedure. These products include cigarettes, e-cigarettes, and chewing tobacco. If you need help quitting, ask your health care provider.  You may have blood samples taken.  Plan to have someone take you home  from the hospital or clinic.  If you will be going home right after the procedure, plan to have someone with you for 24 hours. What happens during the procedure?  You will lie on your back on an X-ray table. You may be strapped to the table if it is tilted.  An IV will be inserted into one of your veins.  Electrodes may be placed on your chest to monitor your heart rate during the procedure.  You will be given one or both of the following: ? A medicine to help you relax (sedative). ? A medicine to numb the area where the catheter will be inserted (local anesthetic).  A small incision will be made for catheter insertion.  The catheter will be inserted into an artery using a guide wire. An X-ray procedure (fluoroscopy) will be used to help guide the catheter to the blood vessel to be examined.  A contrast dye will then be injected into the catheter, and X-rays will be taken. The contrast will help to show where any narrowing or blockages are located in the blood vessels. You may feel flushed as the contrast dye is injected.  Tell your health care provider if you develop chest pain or trouble breathing.  After the fluoroscopy is complete, the catheter will be removed.  A bandage (dressing) will be placed over the site where the catheter was inserted. Pressure will be applied to help stop any bleeding.  The IV will be removed. The procedure may vary among health care providers and hospitals. What happens after the procedure?  Your blood pressure, heart rate, breathing rate, and blood oxygen level will be monitored until you leave the hospital or clinic.  You will be kept in bed lying flat for 6 hours. If the catheter was inserted through your leg, you will be instructed not to bend or cross your legs.  The insertion area and the pulse in your feet or wrist will be checked frequently.  You will be instructed to drink plenty of fluids. This will help wash the contrast dye out of your  body.  You may have more blood tests and X-rays. You may also have a test that records the electrical activity of your heart (electrocardiogram, or ECG).  Do not drive for 24 hours if you were given a sedative during your procedure.  It is up to you to get the results of your procedure. Ask your health care provider, or the department that is doing the procedure, when your results will be ready. Summary  An angiogram is a procedure used to examine the blood vessels.  Before the procedure, follow your health care provider's instructions about eating and drinking restrictions. You may be asked to stop eating and drinking several hours before the procedure.  During the procedure, contrast dye is injected through a thin tube (catheter) into an artery. X-rays are then taken.  After the procedure, you will need to drink plenty of fluids and lie flat for 6 hour. This information is not intended to replace advice given to you by your health care provider. Make sure you discuss any questions you have with your health care provider. Document Revised: 04/03/2019 Document Reviewed: 04/03/2019 Elsevier Patient Education  2021 Elsevier  Inc.

## 2020-11-21 LAB — CBC WITH DIFFERENTIAL/PLATELET
Basophils Absolute: 0 10*3/uL (ref 0.0–0.2)
Basos: 0 %
EOS (ABSOLUTE): 0.1 10*3/uL (ref 0.0–0.4)
Eos: 1 %
Hematocrit: 44.4 % (ref 34.0–46.6)
Hemoglobin: 15.3 g/dL (ref 11.1–15.9)
Immature Grans (Abs): 0 10*3/uL (ref 0.0–0.1)
Immature Granulocytes: 0 %
Lymphocytes Absolute: 2.9 10*3/uL (ref 0.7–3.1)
Lymphs: 28 %
MCH: 30.8 pg (ref 26.6–33.0)
MCHC: 34.5 g/dL (ref 31.5–35.7)
MCV: 89 fL (ref 79–97)
Monocytes Absolute: 0.8 10*3/uL (ref 0.1–0.9)
Monocytes: 8 %
Neutrophils Absolute: 6.4 10*3/uL (ref 1.4–7.0)
Neutrophils: 63 %
Platelets: 257 10*3/uL (ref 150–450)
RBC: 4.97 x10E6/uL (ref 3.77–5.28)
RDW: 13.5 % (ref 11.7–15.4)
WBC: 10.1 10*3/uL (ref 3.4–10.8)

## 2020-11-21 LAB — BASIC METABOLIC PANEL
BUN/Creatinine Ratio: 25 (ref 12–28)
BUN: 20 mg/dL (ref 8–27)
CO2: 23 mmol/L (ref 20–29)
Calcium: 10.1 mg/dL (ref 8.7–10.3)
Chloride: 96 mmol/L (ref 96–106)
Creatinine, Ser: 0.79 mg/dL (ref 0.57–1.00)
GFR calc Af Amer: 88 mL/min/{1.73_m2} (ref >59)
GFR calc non Af Amer: 76 mL/min/{1.73_m2} (ref >59)
Glucose: 72 mg/dL (ref 65–99)
Potassium: 3.8 mmol/L (ref 3.5–5.2)
Sodium: 138 mmol/L (ref 134–144)

## 2020-11-21 LAB — SARS CORONAVIRUS 2 (TAT 6-24 HRS): SARS Coronavirus 2: NEGATIVE

## 2020-11-22 NOTE — H&P (Signed)
New PAF, Familial HLD, new angina, high risk nuclear study

## 2020-11-23 ENCOUNTER — Other Ambulatory Visit: Payer: Self-pay

## 2020-11-23 ENCOUNTER — Other Ambulatory Visit: Payer: Self-pay | Admitting: Surgery

## 2020-11-23 ENCOUNTER — Ambulatory Visit (HOSPITAL_COMMUNITY)
Admission: RE | Admit: 2020-11-23 | Discharge: 2020-11-23 | Disposition: A | Discharge status: Home / Self Care | Payer: Medicare Other | Attending: Interventional Cardiology | Admitting: Interventional Cardiology

## 2020-11-23 ENCOUNTER — Encounter (HOSPITAL_COMMUNITY)
Admission: RE | Disposition: A | Discharge status: Home / Self Care | Payer: Self-pay | Source: Home / Self Care | Attending: Interventional Cardiology

## 2020-11-23 ENCOUNTER — Encounter (HOSPITAL_COMMUNITY): Payer: Self-pay | Admitting: Interventional Cardiology

## 2020-11-23 DIAGNOSIS — E785 Hyperlipidemia, unspecified: Secondary | ICD-10-CM | POA: Diagnosis not present

## 2020-11-23 DIAGNOSIS — I480 Paroxysmal atrial fibrillation: Secondary | ICD-10-CM | POA: Diagnosis not present

## 2020-11-23 DIAGNOSIS — I700 Atherosclerosis of aorta: Secondary | ICD-10-CM | POA: Diagnosis present

## 2020-11-23 DIAGNOSIS — R9431 Abnormal electrocardiogram [ECG] [EKG]: Secondary | ICD-10-CM | POA: Diagnosis present

## 2020-11-23 DIAGNOSIS — I2510 Atherosclerotic heart disease of native coronary artery without angina pectoris: Secondary | ICD-10-CM | POA: Insufficient documentation

## 2020-11-23 DIAGNOSIS — R9439 Abnormal result of other cardiovascular function study: Secondary | ICD-10-CM

## 2020-11-23 DIAGNOSIS — R011 Cardiac murmur, unspecified: Secondary | ICD-10-CM | POA: Diagnosis present

## 2020-11-23 DIAGNOSIS — R0609 Other forms of dyspnea: Secondary | ICD-10-CM | POA: Diagnosis present

## 2020-11-23 DIAGNOSIS — E7800 Pure hypercholesterolemia, unspecified: Secondary | ICD-10-CM | POA: Diagnosis present

## 2020-11-23 DIAGNOSIS — R0600 Dyspnea, unspecified: Secondary | ICD-10-CM | POA: Diagnosis present

## 2020-11-23 HISTORY — PX: LEFT HEART CATH AND CORONARY ANGIOGRAPHY: CATH118249

## 2020-11-23 LAB — GLUCOSE, CAPILLARY
Glucose-Capillary: 142 mg/dL — ABNORMAL HIGH (ref 70–99)
Glucose-Capillary: 105 mg/dL — ABNORMAL HIGH (ref 70–99)

## 2020-11-23 SURGERY — LEFT HEART CATH AND CORONARY ANGIOGRAPHY
Anesthesia: LOCAL

## 2020-11-23 MED ORDER — SODIUM CHLORIDE 0.9 % IV SOLN: 250.0000 mL | INTRAVENOUS | Status: DC | PRN

## 2020-11-23 MED ORDER — SODIUM CHLORIDE 0.9% FLUSH: 3.0000 mL | INTRAVENOUS | Status: DC | PRN

## 2020-11-23 MED ORDER — HEPARIN SODIUM (PORCINE) 1000 UNIT/ML IJ SOLN
INTRAMUSCULAR | Status: AC
  Filled 2020-11-23: qty 1

## 2020-11-23 MED ORDER — SODIUM CHLORIDE 0.9 % WEIGHT BASED INFUSION: 1.0000 mL/kg/h | INTRAVENOUS | Status: DC

## 2020-11-23 MED ORDER — SODIUM CHLORIDE 0.9 % WEIGHT BASED INFUSION
3.0000 mL/kg/h | INTRAVENOUS | Status: AC
  Administered 2020-11-23: 3 mL/kg/h via INTRAVENOUS

## 2020-11-23 MED ORDER — VERAPAMIL HCL 2.5 MG/ML IV SOLN
INTRAVENOUS | Status: AC
  Filled 2020-11-23: qty 2

## 2020-11-23 MED ORDER — VERAPAMIL HCL 2.5 MG/ML IV SOLN
INTRAVENOUS | Status: DC | PRN
  Administered 2020-11-23: 10 mL via INTRA_ARTERIAL

## 2020-11-23 MED ORDER — MIDAZOLAM HCL 2 MG/2ML IJ SOLN
INTRAMUSCULAR | Status: AC
  Filled 2020-11-23: qty 2

## 2020-11-23 MED ORDER — ASPIRIN 81 MG PO CHEW: 81.0000 mg | CHEWABLE_TABLET | ORAL | Status: DC

## 2020-11-23 MED ORDER — IOHEXOL 350 MG/ML SOLN
INTRAVENOUS | Status: DC | PRN
  Administered 2020-11-23: 80 mL

## 2020-11-23 MED ORDER — LIDOCAINE HCL (PF) 1 % IJ SOLN
INTRAMUSCULAR | Status: AC
  Filled 2020-11-23: qty 30

## 2020-11-23 MED ORDER — FENTANYL CITRATE (PF) 100 MCG/2ML IJ SOLN
INTRAMUSCULAR | Status: DC | PRN
  Administered 2020-11-23: 50 ug via INTRAVENOUS

## 2020-11-23 MED ORDER — FENTANYL CITRATE (PF) 100 MCG/2ML IJ SOLN
INTRAMUSCULAR | Status: AC
  Filled 2020-11-23: qty 2

## 2020-11-23 MED ORDER — HEPARIN SODIUM (PORCINE) 1000 UNIT/ML IJ SOLN
INTRAMUSCULAR | Status: DC | PRN
  Administered 2020-11-23: 5500 [IU] via INTRAVENOUS

## 2020-11-23 MED ORDER — SODIUM CHLORIDE 0.9% FLUSH: 3.0000 mL | Freq: Two times a day (BID) | INTRAVENOUS | Status: DC

## 2020-11-23 MED ORDER — LIDOCAINE HCL (PF) 1 % IJ SOLN
INTRAMUSCULAR | Status: DC | PRN
  Administered 2020-11-23: 2 mL

## 2020-11-23 MED ORDER — HEPARIN (PORCINE) IN NACL 1000-0.9 UT/500ML-% IV SOLN
INTRAVENOUS | Status: DC | PRN
  Administered 2020-11-23 (×2): 500 mL

## 2020-11-23 MED ORDER — MIDAZOLAM HCL 2 MG/2ML IJ SOLN
INTRAMUSCULAR | Status: DC | PRN
  Administered 2020-11-23: 1 mg via INTRAVENOUS

## 2020-11-23 MED ORDER — HEPARIN (PORCINE) IN NACL 1000-0.9 UT/500ML-% IV SOLN
INTRAVENOUS | Status: AC
  Filled 2020-11-23: qty 1000

## 2020-11-23 SURGICAL SUPPLY — 12 items
BAG SNAP BAND KOVER 36X36 (MISCELLANEOUS) ×1 IMPLANT
CATH 5FR JL3.5 JR4 ANG PIG MP (CATHETERS) ×1 IMPLANT
COVER DOME SNAP 22 D (MISCELLANEOUS) ×1 IMPLANT
DEVICE RAD COMP TR BAND LRG (VASCULAR PRODUCTS) ×1 IMPLANT
GLIDESHEATH SLEND A-KIT 6F 22G (SHEATH) ×1 IMPLANT
GUIDEWIRE INQWIRE 1.5J.035X260 (WIRE) IMPLANT
INQWIRE 1.5J .035X260CM (WIRE) ×2
KIT HEART LEFT (KITS) ×2 IMPLANT
PACK CARDIAC CATHETERIZATION (CUSTOM PROCEDURE TRAY) ×2 IMPLANT
SHEATH PROBE COVER 6X72 (BAG) ×1 IMPLANT
TRANSDUCER W/STOPCOCK (MISCELLANEOUS) ×2 IMPLANT
TUBING CIL FLEX 10 FLL-RA (TUBING) ×2 IMPLANT

## 2020-11-23 NOTE — Interval H&P Note (Signed)
Cath Lab Visit (complete for each Cath Lab visit)  Clinical Evaluation Leading to the Procedure:   ACS: Yes.    Non-ACS:    Anginal Classification: CCS Jordan  Anti-ischemic medical therapy: Minimal Therapy (1 class of medications)  Non-Invasive Test Results: High-risk stress test findings: cardiac mortality >3%/year  Prior CABG: No previous CABG      History and Physical Interval Note:  11/23/2020 7:44 AM  [name]  has presented today for surgery, with the diagnosis of abnormal stress test.  The various methods of treatment have been discussed with the patient and family. After consideration of risks, benefits and other options for treatment, the patient has consented to  Procedure(s): LEFT HEART CATH AND CORONARY ANGIOGRAPHY (N/A) as a surgical intervention.  The patient's history has been reviewed, patient examined, no change in status, stable for surgery.  I have reviewed the patient's chart and labs.  Questions were answered to the patient's satisfaction.     Lindsey Jordan

## 2020-11-23 NOTE — Progress Notes (Signed)
Per Phil Dopp client may resume xarelto tomorrow

## 2020-11-23 NOTE — Discharge Instructions (Signed)
NO METFORMIN/ GLUCOPHAGE FOR 2 DAYS May resume xarelto tomorrow  Radial Site Care  This sheet gives you information about how to care for yourself after your procedure. Your health care provider may also give you more specific instructions. If you have problems or questions, contact your health care provider. What can I expect after the procedure? After the procedure, it is common to have:  Bruising and tenderness at the catheter insertion area. Follow these instructions at home: Medicines  Take over-the-counter and prescription medicines only as told by your health care provider. Insertion site care  Follow instructions from your health care provider about how to take care of your insertion site. Make sure you: ? Wash your hands with soap and water before you change your bandage (dressing). If soap and water are not available, use hand sanitizer. ? Change your dressing as told by your health care provider. ? Leave stitches (sutures), skin glue, or adhesive strips in place. These skin closures may need to stay in place for 2 weeks or longer. If adhesive strip edges start to loosen and curl up, you may trim the loose edges. Do not remove adhesive strips completely unless your health care provider tells you to do that.  Check your insertion site every day for signs of infection. Check for: ? Redness, swelling, or pain. ? Fluid or blood. ? Pus or a bad smell. ? Warmth.  Do not take baths, swim, or use a hot tub until your health care provider approves.  You may shower 24-48 hours after the procedure, or as directed by your health care provider. ? Remove the dressing and gently wash the site with plain soap and water. ? Pat the area dry with a clean towel. ? Do not rub the site. That could cause bleeding.  Do not apply powder or lotion to the site. Activity  For 24 hours after the procedure, or as directed by your health care provider: ? Do not flex or bend the affected arm. ? Do  not push or pull heavy objects with the affected arm. ? Do not drive yourself home from the hospital or clinic. You may drive 24 hours after the procedure unless your health care provider tells you not to. ? Do not operate machinery or power tools.  Do not lift anything that is heavier than 10 lb (4.5 kg), or the limit that you are told, until your health care provider says that it is safe.  Ask your health care provider when it is okay to: ? Return to work or school. ? Resume usual physical activities or sports. ? Resume sexual activity.   General instructions  If the catheter site starts to bleed, raise your arm and put firm pressure on the site. If the bleeding does not stop, get help right away. This is a medical emergency.  If you went home on the same day as your procedure, a responsible adult should be with you for the first 24 hours after you arrive home.  Keep all follow-up visits as told by your health care provider. This is important. Contact a health care provider if:  You have a fever.  You have redness, swelling, or yellow drainage around your insertion site. Get help right away if:  You have unusual pain at the radial site.  The catheter insertion area swells very fast.  The insertion area is bleeding, and the bleeding does not stop when you hold steady pressure on the area.  Your arm or hand becomes  pale, cool, tingly, or numb. These symptoms may represent a serious problem that is an emergency. Do not wait to see if the symptoms will go away. Get medical help right away. Call your local emergency services (911 in the [place] Do not drive yourself to the hospital. Summary  After the procedure, it is common to have bruising and tenderness at the site.  Follow instructions from your health care provider about how to take care of your radial site wound. Check the wound every day for signs of infection.  Do not lift anything that is heavier than 10 lb (4.5 kg), or the  limit that you are told, until your health care provider says that it is safe. This information is not intended to replace advice given to you by your health care provider. Make sure you discuss any questions you have with your health care provider. Document Revised: 10/25/2017 Document Reviewed: 10/25/2017 Elsevier Patient Education  2021 [employer].

## 2020-11-23 NOTE — CV Procedure (Signed)
   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  Left to right and right to left collaterals are noted.  A smaller branch at the bifurcation contains ostial 70% stenosis.  Distal territory supplied a small.  Segmental diffuse 50% mid LAD after the first septal perforator  Proximal to mid first diagonal segmental 50% narrowing.  Normal left main  Dominant RCA with 50% segmental mid narrowing and mid PDA 85% stenosis.  Right to left collaterals noted from the right coronary.  Normal LV function.  RECOMMENDATIONS: Aggressive risk prevention concentrating on lipids, blood pressure, and diabetes control.  If symptoms occur outside the realm of atrial fibrillation elbow limiting physical activity, could potentially treat the mid PDA stenosis with stenting.  No indication now and absence of anginal complaints at baseline.  Stenting would require addition of antiplatelet therapy to anticoagulation which would increase the risk of bleeding which would likely be a greater risk than any benefit provided by stenting the distal territories.

## 2020-11-24 ENCOUNTER — Other Ambulatory Visit: Payer: Self-pay | Admitting: Surgery

## 2020-11-24 DIAGNOSIS — R229 Localized swelling, mass and lump, unspecified: Secondary | ICD-10-CM

## 2020-11-28 ENCOUNTER — Other Ambulatory Visit: Payer: Self-pay

## 2020-11-28 ENCOUNTER — Ambulatory Visit (HOSPITAL_BASED_OUTPATIENT_CLINIC_OR_DEPARTMENT_OTHER): Payer: Medicare Other | Attending: Cardiology | Admitting: Cardiology

## 2020-11-28 DIAGNOSIS — R400 Somnolence: Secondary | ICD-10-CM

## 2020-11-28 DIAGNOSIS — R0683 Snoring: Secondary | ICD-10-CM | POA: Insufficient documentation

## 2020-11-28 DIAGNOSIS — I480 Paroxysmal atrial fibrillation: Secondary | ICD-10-CM | POA: Diagnosis not present

## 2020-11-28 DIAGNOSIS — Z79899 Other long term (current) drug therapy: Secondary | ICD-10-CM | POA: Diagnosis not present

## 2020-11-29 NOTE — Procedures (Signed)
   Patient Name: Lindsey Jordan, Lindsey Jordan Date: 11/28/2020 Gender: Female D.O.B: 1951-09-08 Age (years): 69 Referring Provider: Jerline Pain Height (inches): 68 Interpreting Physician: Fransico Him MD, ABSM Weight (lbs): 250 RPSGT: Earney Hamburg BMI: 38 MRN: [id number] Neck Size: 16.00  CLINICAL INFORMATION Sleep Study Type: NPSG  Indication for sleep study: N/A  Epworth Sleepiness Score: 2  SLEEP STUDY TECHNIQUE As per the AASM Manual for the Scoring of Sleep and Associated Events v2.3 (April 2016) with a hypopnea requiring 4% desaturations.  The channels recorded and monitored were frontal, central and occipital EEG, electrooculogram (EOG), submentalis EMG (chin), nasal and oral airflow, thoracic and abdominal wall motion, anterior tibialis EMG, snore microphone, electrocardiogram, and pulse oximetry.  MEDICATIONS Medications self-administered by patient taken the night of the study : Advil pm, CLONOPIN, LEXAPRO, zyzal, Trazadone  SLEEP ARCHITECTURE The study was initiated at 10:41:44 PM and ended at 4:46:33 AM.  Sleep onset time was 54.9 minutes and the sleep efficiency was 72.9%. The total sleep time was 265.9 minutes.  Stage REM latency was 268.5 minutes.  The patient spent 2.6% of the night in stage N1 sleep, 92.7% in stage N2 sleep, 0.0% in stage N3 and 4.7% in REM.  Alpha intrusion was absent.  Supine sleep was 35.73%.  RESPIRATORY PARAMETERS The overall apnea/hypopnea index (AHI) was 0.2 per hour. There were 0 total apneas, including 0 obstructive, 0 central and 0 mixed apneas. There were 1 hypopneas and 10 RERAs.  The AHI during Stage REM sleep was 0.0 per hour.  AHI while supine was 0.0 per hour.  The mean oxygen saturation was 93.3%. The minimum SpO2 during sleep was 90.0%.  soft snoring was noted during this study.  CARDIAC DATA The 2 lead EKG demonstrated sinus rhythm. The mean heart rate was 62.7 beats per minute. Other EKG findings include:  None.  LEG MOVEMENT DATA The total PLMS were 0 with a resulting PLMS index of 0.0. Associated arousal with leg movement index was 0.0 .  IMPRESSIONS - No significant obstructive sleep apnea occurred during this study (AHI = 0.2/h). - No significant central sleep apnea occurred during this study (CAI = 0.0/h). - The patient had minimal or no oxygen desaturation during the study (Min O2 = 90.0%) - The patient snored with soft snoring volume. - No cardiac abnormalities were noted during this study. - Clinically significant periodic limb movements did not occur during sleep. No significant associated arousals.  DIAGNOSIS - Normal Study  RECOMMENDATIONS - Avoid alcohol, sedatives and other CNS depressants that may worsen sleep apnea and disrupt normal sleep architecture. - Sleep hygiene should be reviewed to assess factors that may improve sleep quality. - Weight management and regular exercise should be initiated or continued if appropriate.  [Electronically signed] 11/29/2020 11:14 AM  Fransico Him MD, ABSM Diplomate, American Board of Sleep Medicine

## 2020-12-04 ENCOUNTER — Other Ambulatory Visit: Payer: Self-pay | Admitting: *Deleted

## 2020-12-04 NOTE — Telephone Encounter (Signed)
Spoke with pt re my chart message and pt noted this am a drop of blood on pillow case , lips were red like had applied lipstick,1/2 of tongue was a brown red color and what appeared to be small particles of dried blood in teeth Per pt checked mouth for injury and nothing noted .Insstructed pt to continue to monitor and will call back if has further episodes Also pt was in afib last noc from 10-12 pm. Will forward to Dr Rayann Heman for review .Adonis Housekeeper

## 2020-12-08 ENCOUNTER — Telehealth: Payer: Self-pay | Admitting: *Deleted

## 2020-12-08 NOTE — Telephone Encounter (Addendum)
Informed patient of sleep study results and patient understanding was verbalized. Patient understands her sleep study showed no significant sleep apnea.  Pt is aware and agreeable to normal results.  Left detailed message on voicemail and informed patient to call back with questions

## 2020-12-08 NOTE — Telephone Encounter (Signed)
-----   Message from Sueanne Margarita, MD sent at 11/29/2020 11:22 AM EST ----- Please let patient know that sleep study showed no significant sleep apnea.

## 2020-12-14 ENCOUNTER — Ambulatory Visit (HOSPITAL_COMMUNITY): Payer: Medicare Other | Admitting: Physician Assistant

## 2020-12-16 ENCOUNTER — Ambulatory Visit
Admission: RE | Admit: 2020-12-16 | Discharge: 2020-12-16 | Disposition: A | Discharge status: Home / Self Care | Payer: Medicare Other | Source: Ambulatory Visit | Attending: Surgery | Admitting: Surgery

## 2020-12-16 DIAGNOSIS — R229 Localized swelling, mass and lump, unspecified: Secondary | ICD-10-CM

## 2020-12-16 DIAGNOSIS — R222 Localized swelling, mass and lump, trunk: Secondary | ICD-10-CM | POA: Diagnosis not present

## 2020-12-16 MED ORDER — GADOBENATE DIMEGLUMINE 529 MG/ML IV SOLN
20.0000 mL | Freq: Once | INTRAVENOUS | Status: AC | PRN
  Administered 2020-12-16: 20 mL via INTRAVENOUS

## 2020-12-16 NOTE — Telephone Encounter (Signed)
Lm to call back ./cy 

## 2020-12-17 ENCOUNTER — Ambulatory Visit (HOSPITAL_COMMUNITY)
Admission: RE | Admit: 2020-12-17 | Discharge: 2020-12-17 | Disposition: A | Discharge status: Home / Self Care | Payer: Medicare Other | Source: Ambulatory Visit | Attending: Physician Assistant | Admitting: Physician Assistant

## 2020-12-17 ENCOUNTER — Encounter (HOSPITAL_COMMUNITY): Payer: Self-pay | Admitting: Physician Assistant

## 2020-12-17 ENCOUNTER — Other Ambulatory Visit: Payer: Self-pay

## 2020-12-17 VITALS — BP 142/80 | HR 73 | Ht 68.0 in | Wt 248.6 lb

## 2020-12-17 DIAGNOSIS — D6869 Other thrombophilia: Secondary | ICD-10-CM | POA: Diagnosis not present

## 2020-12-17 DIAGNOSIS — Z7901 Long term (current) use of anticoagulants: Secondary | ICD-10-CM | POA: Insufficient documentation

## 2020-12-17 DIAGNOSIS — E119 Type 2 diabetes mellitus without complications: Secondary | ICD-10-CM | POA: Insufficient documentation

## 2020-12-17 DIAGNOSIS — I2510 Atherosclerotic heart disease of native coronary artery without angina pectoris: Secondary | ICD-10-CM | POA: Insufficient documentation

## 2020-12-17 DIAGNOSIS — E785 Hyperlipidemia, unspecified: Secondary | ICD-10-CM | POA: Diagnosis not present

## 2020-12-17 DIAGNOSIS — Z7984 Long term (current) use of oral hypoglycemic drugs: Secondary | ICD-10-CM | POA: Diagnosis not present

## 2020-12-17 DIAGNOSIS — Z79899 Other long term (current) drug therapy: Secondary | ICD-10-CM | POA: Insufficient documentation

## 2020-12-17 DIAGNOSIS — Z791 Long term (current) use of non-steroidal anti-inflammatories (NSAID): Secondary | ICD-10-CM | POA: Insufficient documentation

## 2020-12-17 DIAGNOSIS — I480 Paroxysmal atrial fibrillation: Secondary | ICD-10-CM | POA: Diagnosis not present

## 2020-12-17 DIAGNOSIS — I10 Essential (primary) hypertension: Secondary | ICD-10-CM | POA: Insufficient documentation

## 2020-12-17 DIAGNOSIS — Z888 Allergy status to other drugs, medicaments and biological substances status: Secondary | ICD-10-CM | POA: Diagnosis not present

## 2020-12-17 DIAGNOSIS — Z6837 Body mass index (BMI) 37.0-37.9, adult: Secondary | ICD-10-CM | POA: Insufficient documentation

## 2020-12-17 DIAGNOSIS — Z7982 Long term (current) use of aspirin: Secondary | ICD-10-CM | POA: Insufficient documentation

## 2020-12-17 DIAGNOSIS — E669 Obesity, unspecified: Secondary | ICD-10-CM | POA: Diagnosis not present

## 2020-12-17 NOTE — Telephone Encounter (Signed)
No further episodes of bleeding per pt Will continue to monitor. Per pt had episode of ankle pain that lasted about 30 min and then noted some calf pain that resolved in about 30 min as well Pt instructed to continue to monitor and call back if needed Pt verbalized understanding ./cy

## 2020-12-17 NOTE — Progress Notes (Signed)
Primary Care Physician: Noni Ada, MD Primary Cardiologist: Dr Marlou Porch Primary Electrophysiologist: Dr Rayann Heman  Referring Physician: Dr Guss Bunde is a 70 y.o. female with a history of HLD, HTN, DM, CAD, and atrial fibrillation who presents for follow up in the [facility]. The patient was initially diagnosed with atrial fibrillation 10/29/20 after presenting with symptoms of tachypalpitations and chest pain. Patient is on Xarelto for a CHADS2VASC score of 5. She had a LHC which showed total occlusion of the mid to distal OM1 beyond bifurcation, diffuse 50% mid LAD, Proximal to mid first diagonal segmental 50% narrowing. Dominant RCA with 50% segmental mid narrowing and mid PDA 85% stenosis. Aggressive medical management was recommended. Patient reports that she has had only one episode of afib since seeing Dr Rayann Heman which lasted about 2 hours. She denies any significant alcohol use.  Today, she denies symptoms of shortness of breath, orthopnea, PND, lower extremity edema, dizziness, presyncope, syncope, snoring, daytime somnolence, bleeding, or neurologic sequela. The patient is tolerating medications without difficulties and is otherwise without complaint today.    Atrial Fibrillation Risk Factors:  she does not have symptoms or diagnosis of sleep apnea. Negative sleep study. she does not have a history of rheumatic fever. she does not have a history of alcohol use. The patient does have a history of early familial atrial fibrillation or other arrhythmias. Mother had afib.  she has a BMI of Body mass index is 37.8 kg/m.Marland Kitchen Filed Weights   12/17/20 1345  Weight: 112.8 kg    Family History  Problem Relation Age of Onset  . Hypertension Father   . Breast cancer Maternal Aunt      Atrial Fibrillation Management history:  Previous antiarrhythmic drugs: none Previous cardioversions: none Previous ablations: none CHADS2VASC score:  5 Anticoagulation history: Xarelto    Past Medical History:  Diagnosis Date  . Diabetes mellitus without complication ([place])   . Hypertension   . Kidney stones   . Morbid obesity ([place])   . Paroxysmal atrial fibrillation [facility])    Past Surgical History:  Procedure Laterality Date  . ABDOMINAL HYSTERECTOMY  2003  . ADENOIDECTOMY     age 43  . BREAST LUMPECTOMY     x 2 in her 20's  . BREAST SURGERY    . CESAREAN SECTION     one previous  . ECTOPIC PREGNANCY SURGERY    . HEMORRHOID SURGERY     in 20's  . LEFT HEART CATH AND CORONARY ANGIOGRAPHY N/A 11/23/2020   Procedure: LEFT HEART CATH AND CORONARY ANGIOGRAPHY;  Surgeon: Belva Crome, MD;  Location: [place] [facility];  Service: Cardiovascular;  Laterality: N/A;  . TONSILLECTOMY      Current Outpatient Medications  Medication Sig Dispense Refill  . acyclovir (ZOVIRAX) 200 MG capsule Take 200 mg by mouth See admin instructions. Take 1 capsule (200 mg) by mouth in the morning & take 1 capsule (200 mg) by mouth 3 times daily if outbreak presents    . aspirin EC 81 MG tablet Take 1 tablet (81 mg total) by mouth daily. Swallow whole. 90 tablet 3  . clonazePAM (KLONOPIN) 1 MG tablet Take 1 mg by mouth at bedtime.    Marland Kitchen diltiazem (CARDIZEM CD) 240 MG 24 hr capsule Take 1 capsule (240 mg total) by mouth daily. 90 capsule 3  . diltiazem (CARDIZEM) 60 MG tablet Take 1 tablet (60 mg total) by mouth 4 (four) times daily as needed.  As needed for break through At Fib 30 tablet prn  . escitalopram (LEXAPRO) 10 MG tablet Take 10 mg by mouth at bedtime.    . Evolocumab (REPATHA SURECLICK) 297 MG/ML SOAJ Inject 1 pen into the skin every 14 (fourteen) days. 6 mL 3  . fluticasone (FLONASE) 50 MCG/ACT nasal spray Place 1 spray into both nostrils daily.    . Ibuprofen-diphenhydrAMINE HCl (ADVIL PM) 200-25 MG CAPS Take 3 tablets by mouth at bedtime.    Marland Kitchen levocetirizine (XYZAL) 5 MG tablet Take 5 mg by mouth every evening.    . metFORMIN  (GLUCOPHAGE-XR) 500 MG 24 hr tablet Take 500 mg by mouth every evening.    . rivaroxaban (XARELTO) 20 MG TABS tablet Take 1 tablet (20 mg total) by mouth daily with supper. 30 tablet 6  . traZODone (DESYREL) 100 MG tablet Take 100 mg by mouth at bedtime.    . triamterene-hydrochlorothiazide (MAXZIDE-25) 37.5-25 MG per tablet Take 1 tablet by mouth daily.    . nitroGLYCERIN (NITROSTAT) 0.4 MG SL tablet Place 1 tablet (0.4 mg total) under the tongue every 5 (five) minutes x 3 doses as needed for chest pain. (Patient not taking: Reported on 12/17/2020) 15 tablet 3   No current facility-administered medications for this encounter.    Allergies  Allergen Reactions  . Biaxin [Clarithromycin]     Extreme stomach discomfort  . Statins     Severe muscle aches with Lipitor (1998) and Crestor (2008)  . Welchol [Colesevelam Hcl]     Side effects - muscle and GI    Social History   Socioeconomic History  . Marital status: Married    Spouse name: Not on file  . Number of children: Not on file  . Years of education: Not on file  . Highest education level: Not on file  Occupational History  . Not on file  Tobacco Use  . Smoking status: Never Smoker  . Smokeless tobacco: Never Used  Substance and Sexual Activity  . Alcohol use: No  . Drug use: No  . Sexual activity: Yes    Birth control/protection: Surgical  Other Topics Concern  . Not on file  Social History Narrative  . Not on file   Social Determinants of Health   Financial Resource Strain: Not on file  Food Insecurity: Not on file  Transportation Needs: Not on file  Physical Activity: Not on file  Stress: Not on file  Social Connections: Not on file  Intimate Partner Violence: Not on file     ROS- All systems are reviewed and negative except as per the HPI above.  Physical Exam: Vitals:   12/17/20 1345  BP: (!) 142/80  Pulse: 73  Weight: 112.8 kg  Height: 5\' 8"  (1.727 m)    GEN- The patient is a well appearing obese  female, alert and oriented x 3 today.   Head- normocephalic, atraumatic Eyes-  Sclera clear, conjunctiva pink Ears- hearing intact Oropharynx- clear Neck- supple  Lungs- Clear to ausculation bilaterally, normal work of breathing Heart- Regular rate and rhythm, no murmurs, rubs or gallops  GI- soft, NT, ND, + BS Extremities- no clubbing, cyanosis, or edema MS- no significant deformity or atrophy Skin- no rash or lesion Psych- euthymic mood, full affect Neuro- strength and sensation are intact  Wt Readings from Last 3 Encounters:  12/17/20 112.8 kg  11/28/20 113.4 kg  11/23/20 112.5 kg    EKG today demonstrates  SR, NST Vent. rate 73 BPM PR interval 170 ms  QRS duration 82 ms QT/QTc 398/438 ms  Echo 11/18/20 demonstrated  1. Left ventricular ejection fraction, by estimation, is 55 to 60%. Left  ventricular ejection fraction by 3D volume is 59 %. The left ventricle has  normal function. The left ventricle demonstrates regional wall motion  abnormalities (see scoring  diagram/findings for description). Left ventricular diastolic parameters  were normal. There is mild hypokinesis of the left ventricular, basal-mid  inferolateral wall and inferior wall. The average left ventricular global  longitudinal strain is -20.0 %.  The global longitudinal strain is normal.  2. Right ventricular systolic function is normal. The right ventricular  size is normal. There is normal pulmonary artery systolic pressure.  3. The mitral valve is normal in structure. Trivial mitral valve  regurgitation. No evidence of mitral stenosis.  4. The aortic valve is tricuspid. There is mild calcification of the  aortic valve. Aortic valve regurgitation is mild.  5. The inferior vena cava is normal in size with greater than 50%  respiratory variability, suggesting right atrial pressure of 3 mmHg.   Comparison(s): Changes from prior study are noted. 05/14/14 EF 55-60%.  Epic records are reviewed at  length today  CHA2DS2-VASc Score = 5  The patient's score is based upon: CHF History: No HTN History: Yes Diabetes History: Yes Stroke History: No Vascular Disease History: Yes Age Score: 1 Gender Score: 1      ASSESSMENT AND PLAN: 1. Paroxysmal Atrial Fibrillation (ICD10:  I48.0) The patient's CHA2DS2-VASc score is 5, indicating a 7.2% annual risk of stroke.   Patient in [place] today. We discussed therapeutic options today. Given CAD, would likely avoid class IC. She has discussed ablation with Dr Rayann Heman previously if her afib should become more persistent.  Continue diltiazem 240 mg daily with 60 mg PRN for heart racing Continue Xarelto 20 mg daily  2. Secondary Hypercoagulable State (ICD10:  D68.69) The patient is at significant risk for stroke/thromboembolism based upon her CHA2DS2-VASc Score of 5.  Continue Rivaroxaban (Xarelto).   3. Obesity Body mass index is 37.8 kg/m. Lifestyle modification was discussed at length including regular exercise and weight reduction. Patient to slowly increase physical activity.   4. CAD No anginal symptoms today.  5. HTN Stable, no changes today.   Follow up with Dr Marlou Porch and Dr Rayann Heman as scheduled.    [facility] [address] [place], [place] [address] [phone] 12/17/2020 2:25 PM

## 2020-12-21 ENCOUNTER — Encounter (HOSPITAL_BASED_OUTPATIENT_CLINIC_OR_DEPARTMENT_OTHER): Payer: Medicare Other | Admitting: Cardiology

## 2021-01-05 DIAGNOSIS — R059 Cough, unspecified: Secondary | ICD-10-CM | POA: Diagnosis not present

## 2021-01-06 ENCOUNTER — Other Ambulatory Visit: Payer: Self-pay | Admitting: Internal Medicine

## 2021-01-08 ENCOUNTER — Other Ambulatory Visit: Payer: Self-pay | Admitting: Family Medicine

## 2021-01-08 ENCOUNTER — Ambulatory Visit
Admission: RE | Admit: 2021-01-08 | Discharge: 2021-01-08 | Disposition: A | Discharge status: Home / Self Care | Payer: Medicare Other | Source: Ambulatory Visit | Attending: Family Medicine | Admitting: Family Medicine

## 2021-01-08 DIAGNOSIS — R059 Cough, unspecified: Secondary | ICD-10-CM

## 2021-01-08 DIAGNOSIS — R0602 Shortness of breath: Secondary | ICD-10-CM | POA: Diagnosis not present

## 2021-01-08 DIAGNOSIS — M170 Bilateral primary osteoarthritis of knee: Secondary | ICD-10-CM | POA: Diagnosis not present

## 2021-01-28 ENCOUNTER — Telehealth: Payer: Self-pay | Admitting: *Deleted

## 2021-01-28 IMAGING — CT CT ABD-PELV W/O CM
2 of 4 series · 17 of 46 positions shown, 19 images · non-contrast
Comparison: CT 09/16/2015

CLINICAL DATA: Hematuria

EXAM:
CT ABDOMEN AND PELVIS WITHOUT CONTRAST
TECHNIQUE: Multidetector CT imaging of the abdomen and pelvis was performed
following the standard protocol without IV contrast.

[Series 2: renal stone 5.00 br40 s3 axial · axial · 0.87mm/px · z∈[+1060,+1490]mm · 14 of 96 slices shown, 16 images]
[im 5/96  soft-tissue]
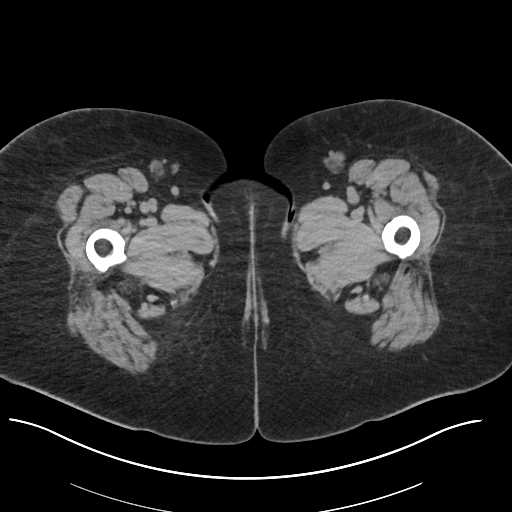
[im 5/96  bone]
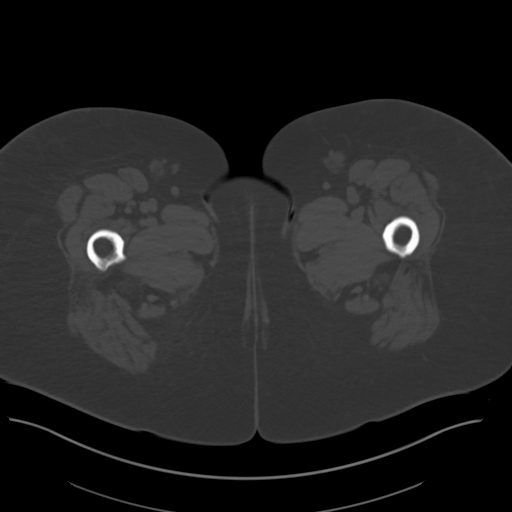
[im 15/96  soft-tissue]
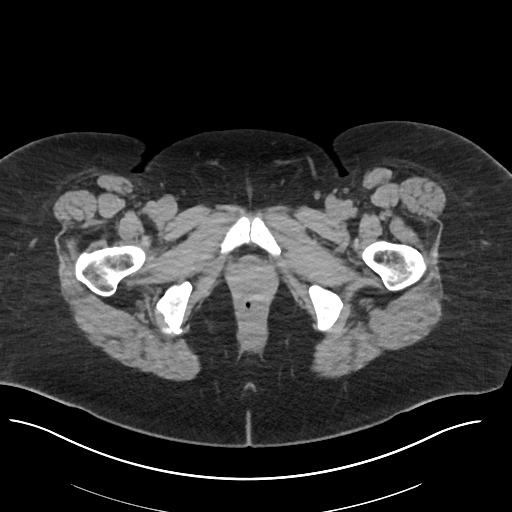
[im 20/96  soft-tissue]
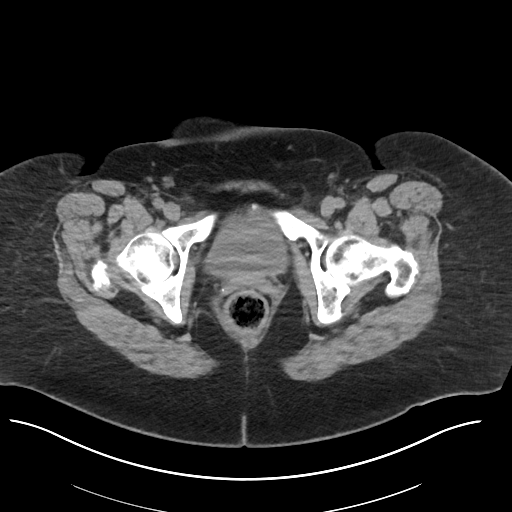
[im 24/96  soft-tissue]
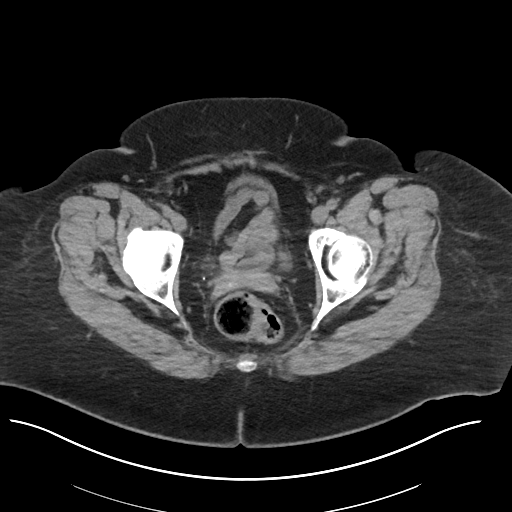
[im 34/96  soft-tissue]
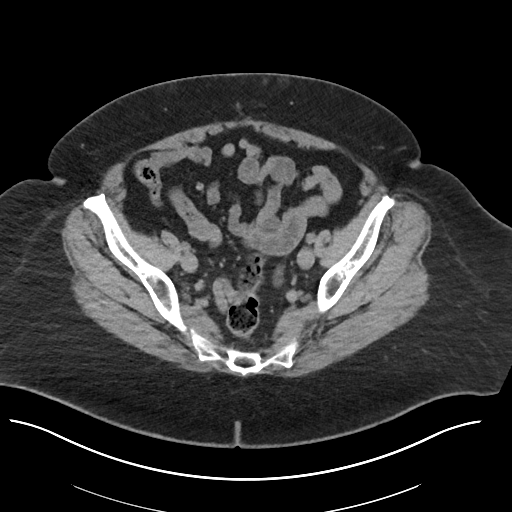
[im 39/96  soft-tissue]
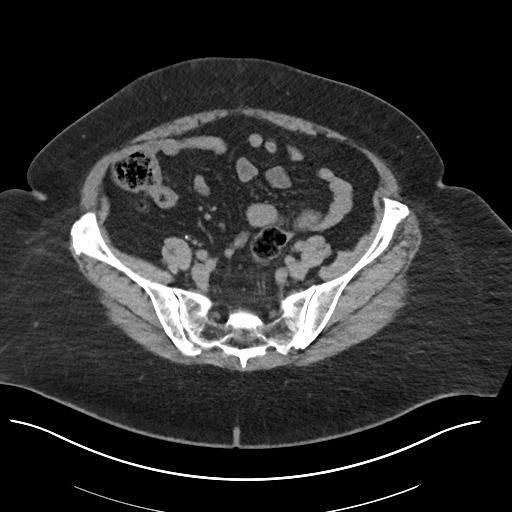
[im 43/96  soft-tissue]
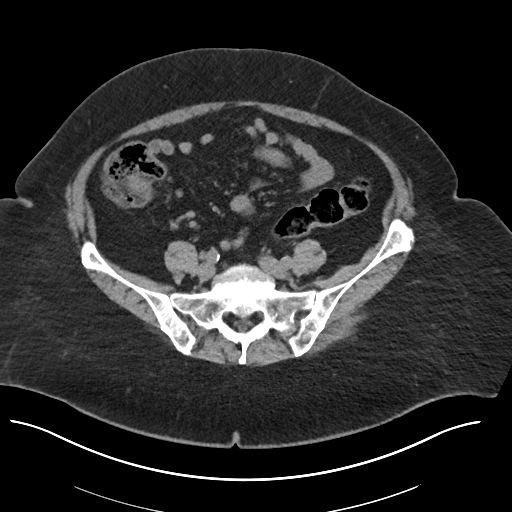
[im 53/96  soft-tissue]
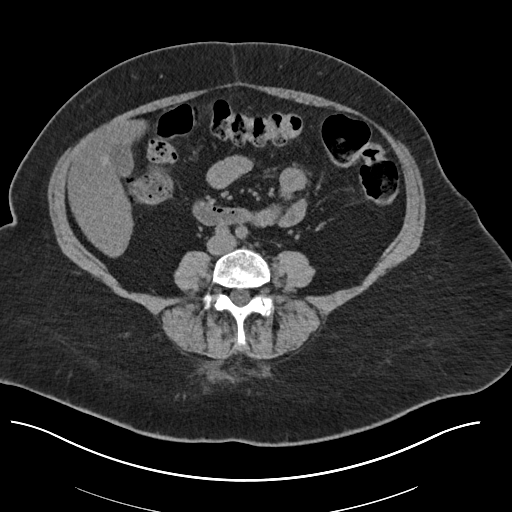
[im 58/96  soft-tissue]
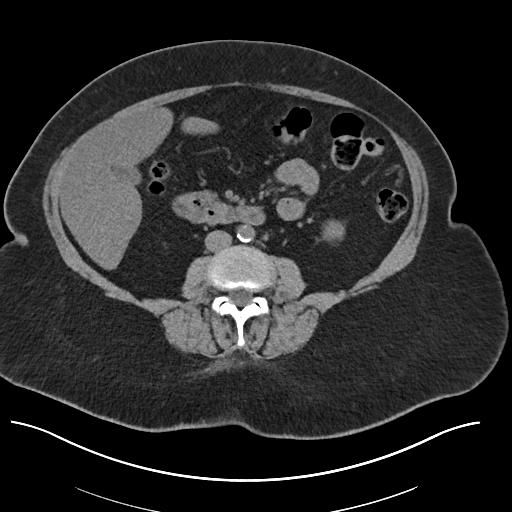
[im 58/96  bone]
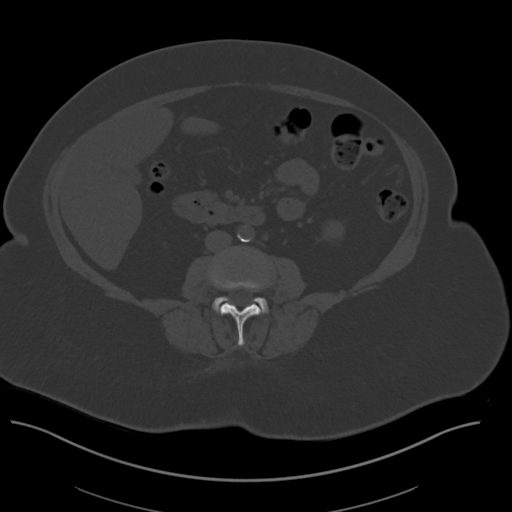
[im 62/96  soft-tissue]
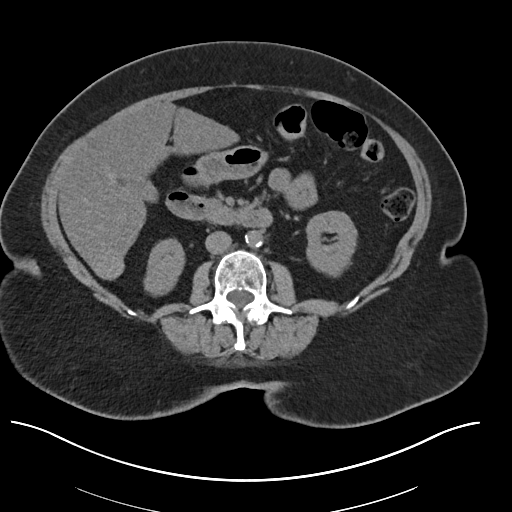
[im 72/96  soft-tissue]
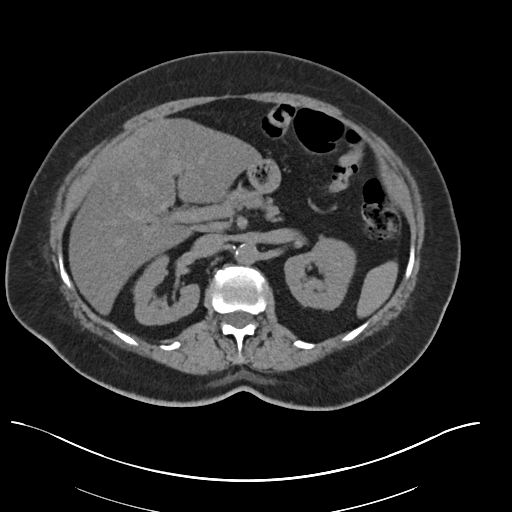
[im 77/96  soft-tissue]
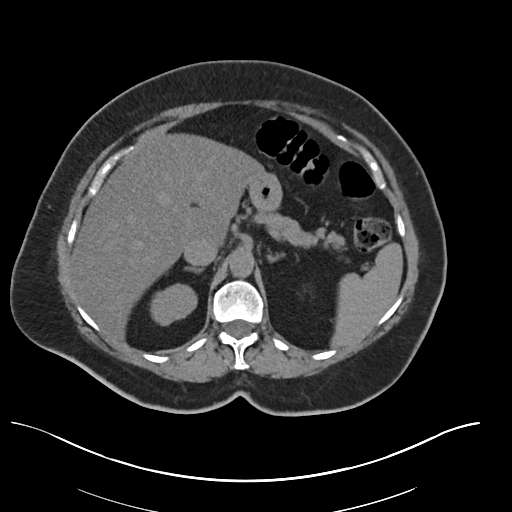
[im 81/96  soft-tissue]
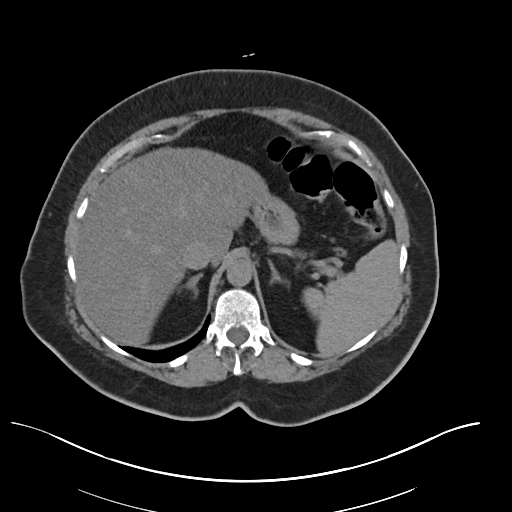
[im 91/96  soft-tissue]
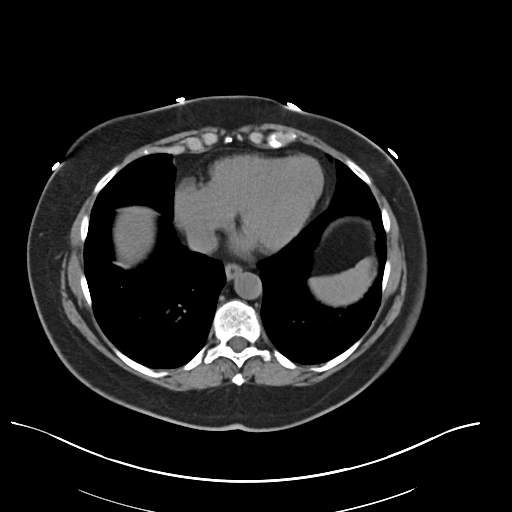

[Series 6: renal stone 2.00 br40 s3 cor · coronal · 0.87mm/px · 3 of 221 slices shown]
[im 74/221  soft-tissue]
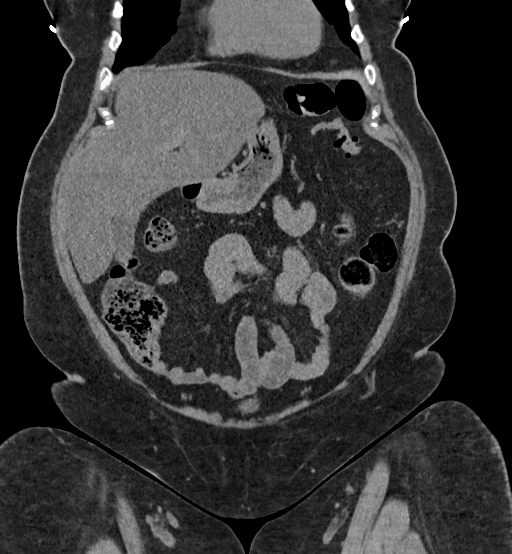
[im 98/221  soft-tissue]
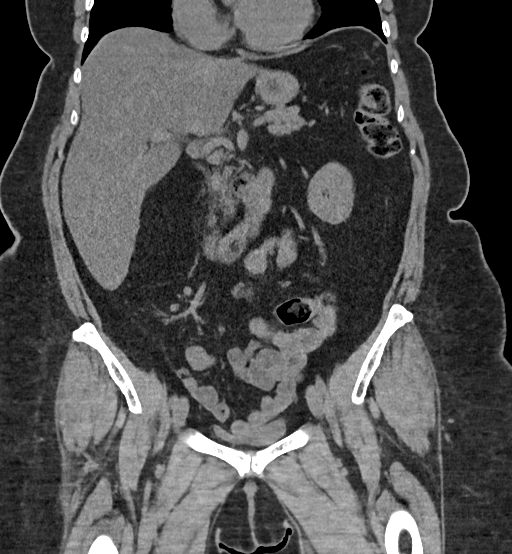
[im 123/221  soft-tissue]
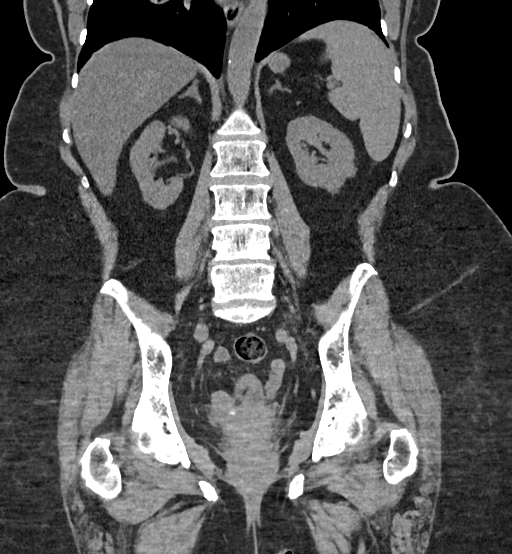

[17 of 46 positions shown; findings below may reference images not displayed]

FINDINGS: Lower chest: Lung bases demonstrate stable subpleural pulmonary
nodules, for example right middle lobe 3 mm nodule, series 4 image
number 3, and lateral lung base 3 mm pulmonary nodule, series 4,
image number 2. These are felt benign given lack of interval change.
Bandlike subpleural density in the right middle lobe suspect for
mild scarring.

Hepatobiliary: Hepatic steatosis with fat sparing near the
gallbladder fossa. No calcified gallstone or biliary dilatation

Pancreas: Unremarkable. No pancreatic ductal dilatation or
surrounding inflammatory changes.

Spleen: Normal in size without focal abnormality.

Adrenals/Urinary Tract: Adrenal glands are normal. No
hydronephrosis. Punctate nonobstructing stone lower pole left
kidney. No ureteral stone. Bladder is unremarkable

Stomach/Bowel: Stomach is within normal limits. Appendix not well
seen but no right lower quadrant inflammatory process. No evidence
of bowel wall thickening, distention, or inflammatory changes.

Vascular/Lymphatic: Mild aortic atherosclerosis. No aneurysm. No
suspicious adenopathy

Reproductive: Status post hysterectomy. No adnexal masses.

Other: Negative for free air or free fluid. Small fat containing
umbilical and periumbilical hernia.

Musculoskeletal: No acute or significant osseous findings.
IMPRESSION: 1. Punctate nonobstructing stone in the left kidney. No
hydronephrosis or ureteral stone.
2. Hepatic steatosis.

Aortic Atherosclerosis ([id number]).

## 2021-01-28 NOTE — Telephone Encounter (Signed)
   [facility] Pre-operative Risk Assessment    Patient Name: Lindsey Jordan  DOB: 01-Apr-1951  MRN: [id number]   HEARTCARE STAFF: - Please ensure there is not already an duplicate clearance open for this procedure. - Under Visit Info/Reason for Call, type in Other and utilize the format Clearance MM/DD/YY or Clearance TBD. Do not use dashes or single digits. - If request is for dental extraction, please clarify the # of teeth to be extracted.  Request for surgical clearance:  1. What type of surgery is being performed? SCREENING COLONOSCOPY   2. When is this surgery scheduled? 05/04/21   3. What type of clearance is required (medical clearance vs. Pharmacy clearance to hold med vs. Both)? BOTH  4. Are there any medications that need to be held prior to surgery and how long? [place]   5. Practice name and name of physician performing surgery? [facility]; DR. Paulita Fujita   6. What is the office phone number? [phone]   7.   What is the office fax number? [phone]  8.   Anesthesia type (None, local, MAC, general) ? PROPOFOL   Lindsey Jordan 01/28/2021, 4:53 PM  _________________________________________________________________   (provider comments below)

## 2021-01-29 NOTE — Telephone Encounter (Signed)
   Primary Cardiologist: Candee Furbish, MD  Chart reviewed as part of pre-operative protocol coverage. Given past medical history and time since last visit, based on ACC/AHA guidelines, Lindsey Jordan would be at acceptable risk for the planned procedure without further cardiovascular testing.   Patient with diagnosis of atrial fibrillation on Xarelto for anticoagulation.    Procedure: screening colonoscopy Date of procedure: 05/04/2021   CHA2DS2-VASc Score = 5  This indicates a 7.2% annual risk of stroke. The patient's score is based upon: CHF History: No HTN History: Yes Diabetes History: Yes Stroke History: No Vascular Disease History: Yes Age Score: 1 Gender Score: 1  CrCl 116.1 Platelet count 257  Per office protocol, patient can hold Xarelto for 1 days prior to procedure.   Patient will not need bridging with Lovenox (enoxaparin) around procedure.  I will route this recommendation to the requesting party via Epic fax function and remove from pre-op pool.  Please call with questions.  Jossie Ng. Amit Meloy NP-C    01/29/2021, 11:48 AM [address] Office [phone] Fax [phone]

## 2021-01-29 NOTE — Telephone Encounter (Signed)
Patient with diagnosis of atrial fibrillation on Xarelto for anticoagulation.    Procedure: screening colonoscopy Date of procedure: 05/04/2021   CHA2DS2-VASc Score = 5  This indicates a 7.2% annual risk of stroke. The patient's score is based upon: CHF History: No HTN History: Yes Diabetes History: Yes Stroke History: No Vascular Disease History: Yes Age Score: 1 Gender Score: 1  CrCl 116.1 Platelet count 257  Per office protocol, patient can hold Xarelto for 1 days prior to procedure.   Patient will not need bridging with Lovenox (enoxaparin) around procedure.

## 2021-02-01 ENCOUNTER — Other Ambulatory Visit: Payer: Self-pay

## 2021-02-01 ENCOUNTER — Encounter: Payer: Self-pay | Admitting: Cardiology

## 2021-02-01 ENCOUNTER — Ambulatory Visit: Payer: Medicare Other | Admitting: Cardiology

## 2021-02-01 VITALS — BP 130/68 | HR 84 | Ht 68.0 in | Wt 245.0 lb

## 2021-02-01 DIAGNOSIS — E7800 Pure hypercholesterolemia, unspecified: Secondary | ICD-10-CM

## 2021-02-01 DIAGNOSIS — I480 Paroxysmal atrial fibrillation: Secondary | ICD-10-CM

## 2021-02-01 DIAGNOSIS — Z79899 Other long term (current) drug therapy: Secondary | ICD-10-CM | POA: Diagnosis not present

## 2021-02-01 LAB — LIPID PANEL
Chol/HDL Ratio: 3.4 ratio (ref 0.0–4.4)
Cholesterol, Total: 161 mg/dL (ref 100–199)
HDL: 48 mg/dL (ref >39)
LDL Chol Calc (NIH): 75 mg/dL (ref 0–99)
Triglycerides: 231 mg/dL — ABNORMAL HIGH (ref 0–149)
VLDL Cholesterol Cal: 38 mg/dL (ref 5–40)

## 2021-02-01 LAB — ALT: ALT: 31 IU/L (ref 0–32)

## 2021-02-01 NOTE — Patient Instructions (Signed)
Medication Instructions:  The current medical regimen is effective;  continue present plan and medications.  *If you need a refill on your cardiac medications before your next appointment, please call your pharmacy*  Lab Work: Please have blood work today. (Lipid/ALT)  If you have labs (blood work) drawn today and your tests are completely normal, you will receive your results only by: . MyChart Message (if you have MyChart) OR . A paper copy in the mail If you have any lab test that is abnormal or we need to change your treatment, we will call you to review the results.  Follow-Up: At [facility], you and your health needs are our priority.  As part of our continuing mission to provide you with exceptional heart care, we have created designated Provider Care Teams.  These Care Teams include your primary Cardiologist (physician) and Advanced Practice Providers (APPs -  Physician Assistants and Nurse Practitioners) who all work together to provide you with the care you need, when you need it.  We recommend signing up for the patient portal called "MyChart".  Sign up information is provided on this After Visit Summary.  MyChart is used to connect with patients for Virtual Visits (Telemedicine).  Patients are able to view lab/test results, encounter notes, upcoming appointments, etc.  Non-urgent messages can be sent to your provider as well.   To learn more about what you can do with MyChart, go to [email or web address].    Your next appointment:   6 month(s)  The format for your next appointment:   In Person  Provider:   Mark Skains, MD   Thank you for choosing [facility]!!     

## 2021-02-01 NOTE — Progress Notes (Signed)
Cardiology Office Note:    Date:  02/01/2021   ID:  Lindsey Jordan, Lindsey Jordan, Lindsey Jordan, Lindsey Jordan  PCP:  Arion Ada, [place]  Cardiologist:  Candee Furbish, MD  Advanced Practice Provider:  No care team member to display Electrophysiologist:  None       Referring MD: Charnita Ada, MD     History of Present Illness:    Lindsey Jordan is a 70 y.o. female here for the follow-up of atrial fibrillation, hypertension hyperlipidemia and diabetes coronary artery disease.  Previously seen in atrial fibrillation clinic by [facility], [place].  12/17/2020.  On Xarelto for CHA2DS2-VASc score of 5.  Prior left heart catheterization showed total occlusion of the mid to distal obtuse marginal 1 beyond bifurcation with diffuse 50% mid LAD, proximal to mid diagonal segmental 50% narrowing, dominant RCA with 50% segmental mid narrowing and mid PDA 85% stenosis.  Aggressive medical management was recommended.  She only had 3 episode of atrial fibrillation lasted about 2 hours since cath. Gets nervous with this. Cousin died in sleep with MI.   Overall doing quite well.  Repatha now. Doing well.   Prior echocardiogram 11/18/2020 showed EF of 60%.  Home staging. 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  Thinking about knee replacement. Would be OK with moderate risk.   Past Medical History:  Diagnosis Date  . Diabetes mellitus without complication ([place])   . Hypertension   . Kidney stones   . Morbid obesity ([place])   . Paroxysmal atrial fibrillation [facility])     Past Surgical History:  Procedure Laterality Date  . ABDOMINAL HYSTERECTOMY  2003  . ADENOIDECTOMY     age 48  . BREAST LUMPECTOMY     x 2 in her 20's  . BREAST SURGERY    . CESAREAN SECTION     one previous  . ECTOPIC PREGNANCY SURGERY    . HEMORRHOID SURGERY     in 20's  . LEFT HEART CATH AND CORONARY ANGIOGRAPHY N/A 11/23/2020   Procedure: LEFT HEART CATH AND  CORONARY ANGIOGRAPHY;  Surgeon: Belva Crome, MD;  Location: [place] [facility];  Service: Cardiovascular;  Laterality: N/A;  . TONSILLECTOMY      Current Medications: Current Meds  Medication Sig  . acyclovir (ZOVIRAX) 200 MG capsule Take 200 mg by mouth See admin instructions. Take 1 capsule (200 mg) by mouth in the morning & take 1 capsule (200 mg) by mouth 3 times daily if outbreak presents  . aspirin EC 81 MG tablet Take 1 tablet (81 mg total) by mouth daily. Swallow whole.  . clonazePAM (KLONOPIN) 1 MG tablet Take 1 mg by mouth at bedtime.  Marland Kitchen diltiazem (CARDIZEM CD) 240 MG 24 hr capsule Take 1 capsule (240 mg total) by mouth daily.  Marland Kitchen diltiazem (CARDIZEM) 60 MG tablet Take 1 tablet (60 mg total) by mouth 4 (four) times daily as needed. As needed for break through At Fib  . escitalopram (LEXAPRO) 10 MG tablet Take 10 mg by mouth at bedtime.  . Evolocumab (REPATHA SURECLICK) 681 MG/ML SOAJ Inject 1 pen into the skin every 14 (fourteen) days.  . fluticasone (FLONASE) 50 MCG/ACT nasal spray Place 1 spray into both nostrils daily.  . Ibuprofen-diphenhydrAMINE HCl (ADVIL PM) 200-25 MG CAPS Take 3 tablets by mouth at bedtime.  Marland Kitchen levocetirizine (XYZAL) 5 MG tablet Take 5 mg by mouth every evening.  . metFORMIN (GLUCOPHAGE-XR) 500 MG 24  hr tablet Take 500 mg by mouth every evening.  . nitroGLYCERIN (NITROSTAT) 0.4 MG SL tablet PLACE 1 TABLET UNDER THE TONGUE EVERY 5 MINUTES X 3 DOSES AS NEEDED FOR CHEST PAIN.  . rivaroxaban (XARELTO) 20 MG TABS tablet Take 1 tablet (20 mg total) by mouth daily with supper.  . traZODone (DESYREL) 100 MG tablet Take 100 mg by mouth at bedtime.  . triamterene-hydrochlorothiazide (MAXZIDE-25) 37.5-25 MG per tablet Take 1 tablet by mouth daily.     Allergies:   Biaxin [clarithromycin], Statins, and Welchol [colesevelam hcl]   Social History   Socioeconomic History  . Marital status: Married    Spouse name: Not on file  . Number of children: Not on file  .  Years of education: Not on file  . Highest education level: Not on file  Occupational History  . Not on file  Tobacco Use  . Smoking status: Never Smoker  . Smokeless tobacco: Never Used  Substance and Sexual Activity  . Alcohol use: No  . Drug use: No  . Sexual activity: Yes    Birth control/protection: Surgical  Other Topics Concern  . Not on file  Social History Narrative  . Not on file   Social Determinants of Health   Financial Resource Strain: Not on file  Food Insecurity: Not on file  Transportation Needs: Not on file  Physical Activity: Not on file  Stress: Not on file  Social Connections: Not on file     Family History: The patient's family history includes Breast cancer in her maternal aunt; Hypertension in her father.  ROS:   Please see the history of present illness.     All other systems reviewed and are negative.  EKGs/Labs/Other Studies Reviewed:    The following studies were reviewed today: As above   Recent Labs: 11/20/2020: BUN 20; Creatinine, Ser 0.79; Hemoglobin 15.3; Platelets 257; Potassium 3.8; Sodium 138  Recent Lipid Panel    Component Value Date/Time   CHOL 175 01/Jordan/2018 0808   TRIG 279 (H) 01/Jordan/2018 0808   HDL 42 01/Jordan/2018 0808   CHOLHDL 4.2 01/Jordan/2018 0808   CHOLHDL 3.7 09/14/2015 0852   VLDL 32 (H) 09/14/2015 0852   LDLCALC 77 01/Jordan/2018 0808   LDLDIRECT 86.0 10/15/2014 0743     Risk Assessment/Calculations:      Physical Exam:    VS:  BP 130/68 (BP Location: Left Arm, Patient Position: Sitting, Cuff Size: Normal)   Pulse 84   Ht 5\' 8"  (1.727 m)   Wt 245 lb (111.1 kg)   LMP  (LMP Unknown)   SpO2 97%   BMI 37.25 kg/m     Wt Readings from Last 3 Encounters:  02/01/21 245 lb (111.1 kg)  12/17/20 248 lb 9.6 oz (112.8 kg)  11/28/20 250 lb (113.4 kg)     GEN:  Well nourished, well developed in no acute distress HEENT: Normal NECK: No JVD; No carotid bruits LYMPHATICS: No lymphadenopathy CARDIAC: RRR, no murmurs,  rubs, gallops RESPIRATORY:  Clear to auscultation without rales, wheezing or rhonchi  ABDOMEN: Soft, non-tender, non-distended MUSCULOSKELETAL:  No edema; No deformity  SKIN: Warm and dry NEUROLOGIC:  Alert and oriented x 3 PSYCHIATRIC:  Normal affect   ASSESSMENT:    1. Paroxysmal atrial fibrillation (HCC)   2. Hypercholesteremia   3. Medication management    PLAN:    In order of problems listed above:  Paroxysmal atrial fibrillation - Continues to maintain sinus rhythm quite well.  Given her coronary artery disease  would like to avoid class Ic medication such as flecainide.  Previously she has discussed ablation with Dr. Rayann Heman if her A. fib should become more persistent.  I think given the rarity of the episodes, only 3 we are not at that point yet.  Continue with diltiazem to 40 once a day with 60 mg as needed for heart racing - Continue Xarelto 20 mg a day  Chronic anticoagulation - Continue with Xarelto  Coronary artery disease - As described above in HPI.  No symptoms. On ASA and Xarelto . No anginal issues.   Hyperlipidemia  - Repatha. Checking labs.  Tolerating well.  Constipation  - on Diltiazem. Increasing fiber  Vertigo positional -Prior cataract surgery.  Rare.   76-month follow-up   Medication Adjustments/Labs and Tests Ordered: Current medicines are reviewed at length with the patient today.  Concerns regarding medicines are outlined above.  Orders Placed This Encounter  Procedures  . ALT  . Lipid panel   No orders of the defined types were placed in this encounter.   Patient Instructions  Medication Instructions:  The current medical regimen is effective;  continue present plan and medications.  *If you need a refill on your cardiac medications before your next appointment, please call your pharmacy*  Lab Work: Please have blood work today (Lipid/ALT)  If you have labs (blood work) drawn today and your tests are completely normal, you will  receive your results only by: Marland Kitchen MyChart Message (if you have MyChart) OR . A paper copy in the mail If you have any lab test that is abnormal or we need to change your treatment, we will call you to review the results.  Follow-Up: At [facility], you and your health needs are our priority.  As part of our continuing mission to provide you with exceptional heart care, we have created designated Provider Care Teams.  These Care Teams include your primary Cardiologist (physician) and Advanced Practice Providers (APPs -  Physician Assistants and Nurse Practitioners) who all work together to provide you with the care you need, when you need it.  We recommend signing up for the patient portal called "MyChart".  Sign up information is provided on this After Visit Summary.  MyChart is used to connect with patients for Virtual Visits (Telemedicine).  Patients are able to view lab/test results, encounter notes, upcoming appointments, etc.  Non-urgent messages can be sent to your provider as well.   To learn more about what you can do with MyChart, go to [email or web address].    Your next appointment:   6 month(s)  The format for your next appointment:   In Person  Provider:   Candee Furbish, MD  Thank you for choosing [facility]!!        Signed, Candee Furbish, MD  02/01/2021 10:21 AM    [place]

## 2021-02-03 ENCOUNTER — Encounter: Payer: Self-pay | Admitting: Internal Medicine

## 2021-02-03 ENCOUNTER — Other Ambulatory Visit: Payer: Self-pay

## 2021-02-03 ENCOUNTER — Ambulatory Visit: Payer: Medicare Other | Admitting: Internal Medicine

## 2021-02-03 VITALS — BP 124/68 | HR 86 | Ht 68.0 in | Wt 246.6 lb

## 2021-02-03 DIAGNOSIS — I480 Paroxysmal atrial fibrillation: Secondary | ICD-10-CM

## 2021-02-03 DIAGNOSIS — R0683 Snoring: Secondary | ICD-10-CM | POA: Diagnosis not present

## 2021-02-03 DIAGNOSIS — I10 Essential (primary) hypertension: Secondary | ICD-10-CM | POA: Diagnosis not present

## 2021-02-03 NOTE — Patient Instructions (Addendum)
Medication Instructions:  Your physician recommends that you continue on your current medications as directed. Please refer to the Current Medication list given to you today.  Labwork: None ordered.  Testing/Procedures: None ordered.  Follow-Up: Your physician wants you to follow-up in: 06/14/21 at 11:30 am with Dr. Rayann Heman.   Any Other Special Instructions Will Be Listed Below (If Applicable).  If you need a refill on your cardiac medications before your next appointment, please call your pharmacy.

## 2021-02-03 NOTE — Progress Notes (Signed)
PCP: Tenasia Ada, MD Primary Cardiologist: Dr Marlou Porch Primary EP: Dr Guss Bunde is a 70 y.o. female who presents today for routine electrophysiology followup.  Since last being seen in our clinic, the patient reports doing very well.  Today, she denies symptoms of palpitations, chest pain, shortness of breath,  lower extremity edema, dizziness, presyncope, or syncope.  The patient is otherwise without complaint today.   Past Medical History:  Diagnosis Date  . Diabetes mellitus without complication ([place])   . Hypertension   . Kidney stones   . Morbid obesity ([place])   . Paroxysmal atrial fibrillation [facility])    Past Surgical History:  Procedure Laterality Date  . ABDOMINAL HYSTERECTOMY  2003  . ADENOIDECTOMY     age 23  . BREAST LUMPECTOMY     x 2 in her 20's  . BREAST SURGERY    . CESAREAN SECTION     one previous  . ECTOPIC PREGNANCY SURGERY    . HEMORRHOID SURGERY     in 20's  . LEFT HEART CATH AND CORONARY ANGIOGRAPHY N/A 11/23/2020   Procedure: LEFT HEART CATH AND CORONARY ANGIOGRAPHY;  Surgeon: Belva Crome, MD;  Location: [place] [facility];  Service: Cardiovascular;  Laterality: N/A;  . TONSILLECTOMY      ROS- all systems are reviewed and negatives except as per HPI above  Current Outpatient Medications  Medication Sig Dispense Refill  . acyclovir (ZOVIRAX) 200 MG capsule Take 200 mg by mouth See admin instructions. Take 1 capsule (200 mg) by mouth in the morning & take 1 capsule (200 mg) by mouth 3 times daily if outbreak presents    . albuterol (VENTOLIN HFA) 108 (90 Base) MCG/ACT inhaler Inhale 2 puffs into the lungs as needed.    Marland Kitchen aspirin EC 81 MG tablet Take 1 tablet (81 mg total) by mouth daily. Swallow whole. 90 tablet 3  . clonazePAM (KLONOPIN) 1 MG tablet Take 1 mg by mouth at bedtime.    Marland Kitchen diltiazem (CARDIZEM CD) 240 MG 24 hr capsule Take 1 capsule (240 mg total) by mouth daily. 90 capsule 3  . diltiazem (CARDIZEM) 60 MG tablet Take 1 tablet  (60 mg total) by mouth 4 (four) times daily as needed. As needed for break through At Fib 30 tablet prn  . escitalopram (LEXAPRO) 10 MG tablet Take 10 mg by mouth at bedtime.    . Evolocumab (REPATHA SURECLICK) 130 MG/ML SOAJ Inject 1 pen into the skin every 14 (fourteen) days. 6 mL 3  . fluticasone (FLONASE) 50 MCG/ACT nasal spray Place 1 spray into both nostrils daily.    . Ibuprofen-diphenhydrAMINE HCl (ADVIL PM) 200-25 MG CAPS Take 3 tablets by mouth at bedtime.    Marland Kitchen levocetirizine (XYZAL) 5 MG tablet Take 5 mg by mouth every evening.    . metFORMIN (GLUCOPHAGE-XR) 500 MG 24 hr tablet Take 500 mg by mouth every evening.    . nitroGLYCERIN (NITROSTAT) 0.4 MG SL tablet PLACE 1 TABLET UNDER THE TONGUE EVERY 5 MINUTES X 3 DOSES AS NEEDED FOR CHEST PAIN. 25 tablet 2  . rivaroxaban (XARELTO) 20 MG TABS tablet Take 1 tablet (20 mg total) by mouth daily with supper. 30 tablet 6  . traZODone (DESYREL) 100 MG tablet Take 100 mg by mouth at bedtime.    . triamterene-hydrochlorothiazide (MAXZIDE-25) 37.5-25 MG per tablet Take 1 tablet by mouth daily.     No current facility-administered medications for this visit.    Physical Exam: Vitals:  02/03/21 1210  BP: 124/68  Pulse: 86  SpO2: 97%  Weight: 246 lb 9.6 oz (111.9 kg)  Height: 5\' 8"  (1.727 m)    GEN- The patient is well appearing, alert and oriented x 3 today.   Head- normocephalic, atraumatic Eyes-  Sclera clear, conjunctiva pink Ears- hearing intact Oropharynx- clear Lungs-   normal work of breathing Heart- Regular rate and rhythm  GI- soft  Extremities- no clubbing, cyanosis, or edema  Wt Readings from Last 3 Encounters:  02/03/21 246 lb 9.6 oz (111.9 kg)  02/01/21 245 lb (111.1 kg)  12/17/20 248 lb 9.6 oz (112.8 kg)    EKG tracing ordered today is personally reviewed and shows sinus, LAD  Assessment and Plan:  1. Paroxysmal atrial fibrillation The patient has symptomatic, recurrent  atrial fibrillation.  Chads2vasc  score is 5.  she is anticoagulated with xarelto . Therapeutic strategies for afib including medicine (multaq, tikosyn, amiodarone) and ablation were discussed in detail with the patient today. She wishes to continue watchful waiting but may consider ablation if her AF progresses.  2. HTN Stable No change required today  3. CAD Cath 11/23/20 reviewed which showed total occlusion of the OM2 (distal vessel) with collateralization.  80-90% focal mid PDA stenosis also noted.   Nonobstructive LAD disease.  Medical therapy advised by Dr Tamala Julian  4. Snoring Follows with Dr Maxwell Caul   Return in 4 months  Thompson Grayer MD, [facility] 02/03/2021 12:11 PM

## 2021-02-14 IMAGING — CR DG CHEST 2V
2 series · 2 of 2 positions shown · non-contrast
Comparison: 10/30/2018

CLINICAL DATA: Mid posterior chest pain radiating to RIGHT shoulder
with cough for 2 weeks, negative COVID test, shortness of breath,
fatigue, history diabetes mellitus, hypertension

EXAM:
CHEST - 2 VIEW

[w chest pa]
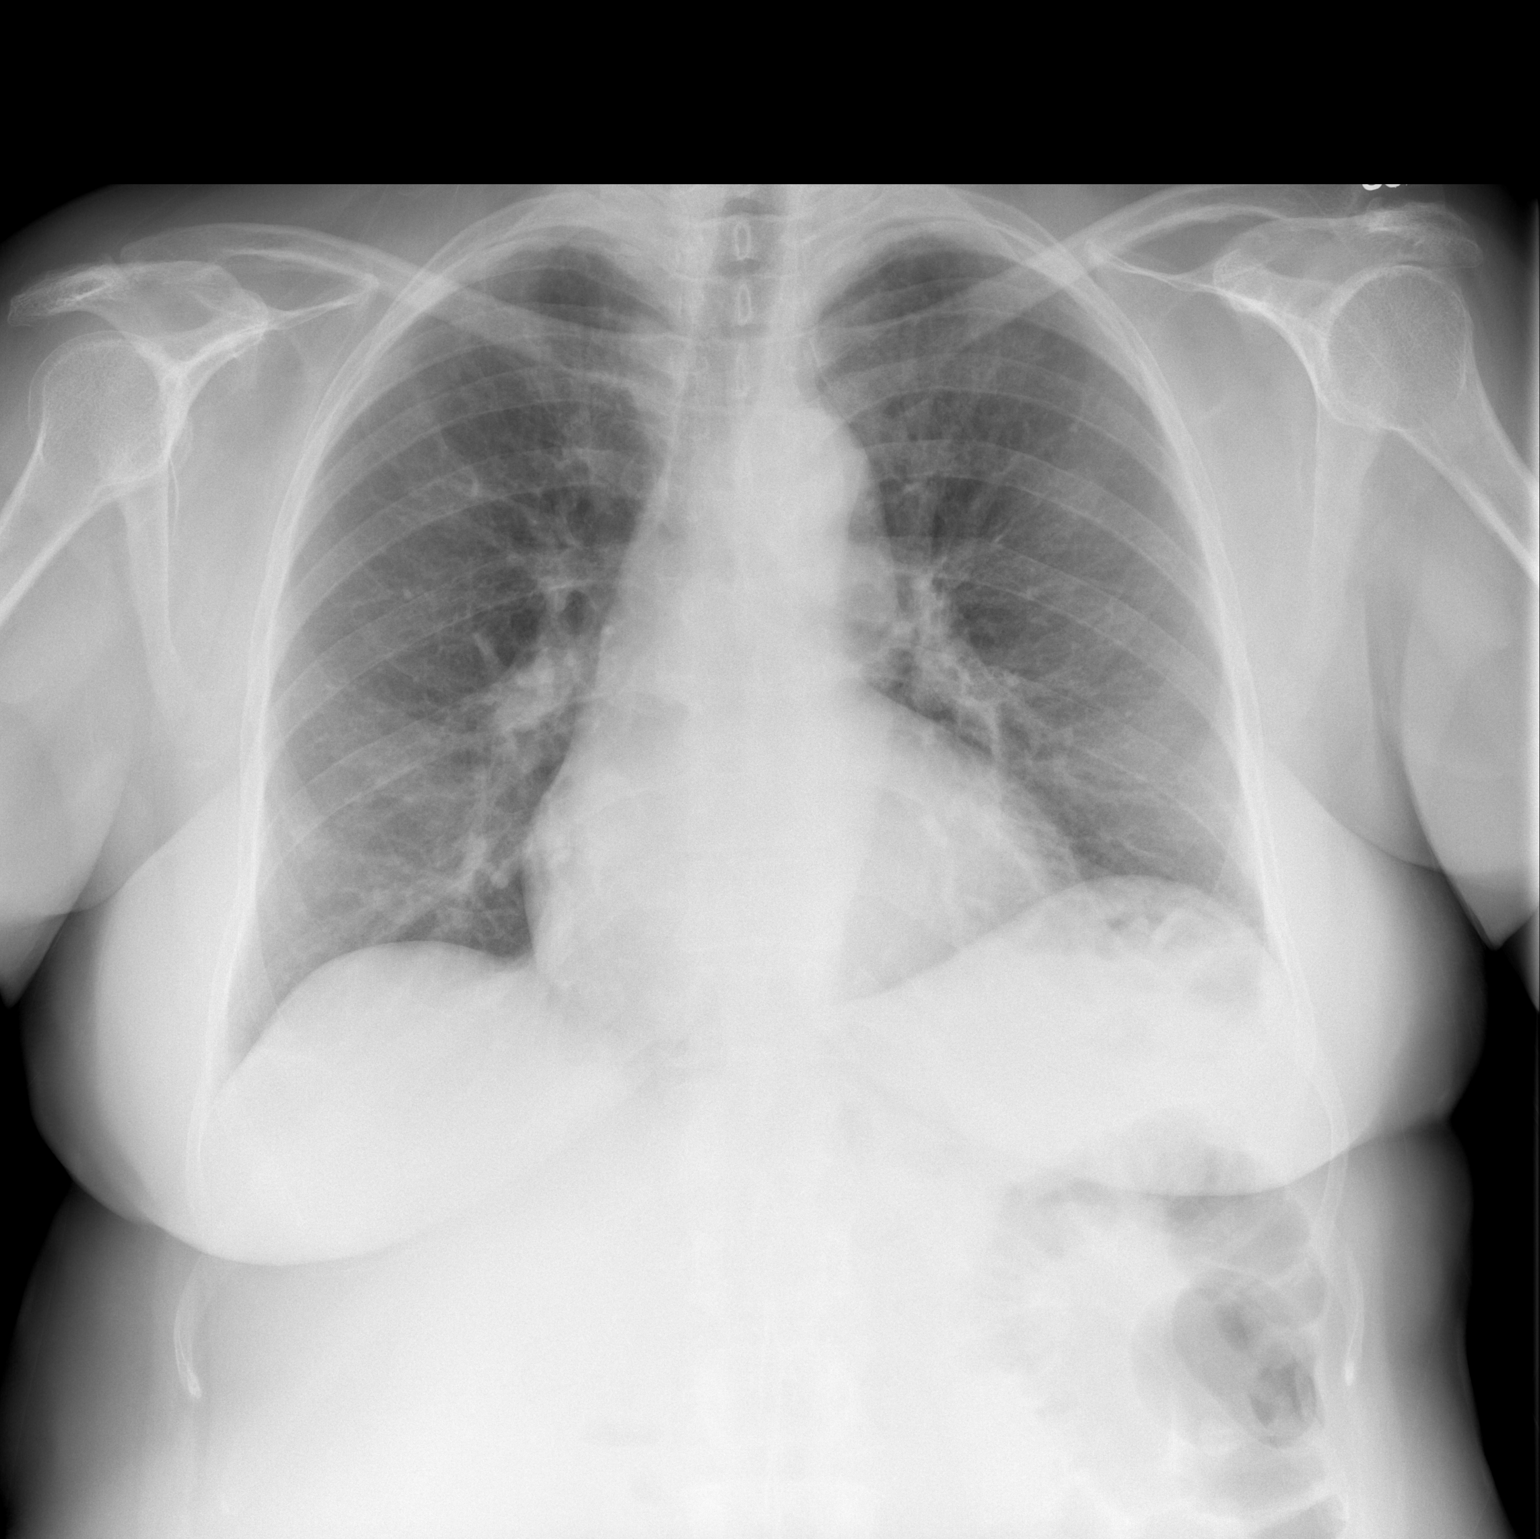

[w chest lat]
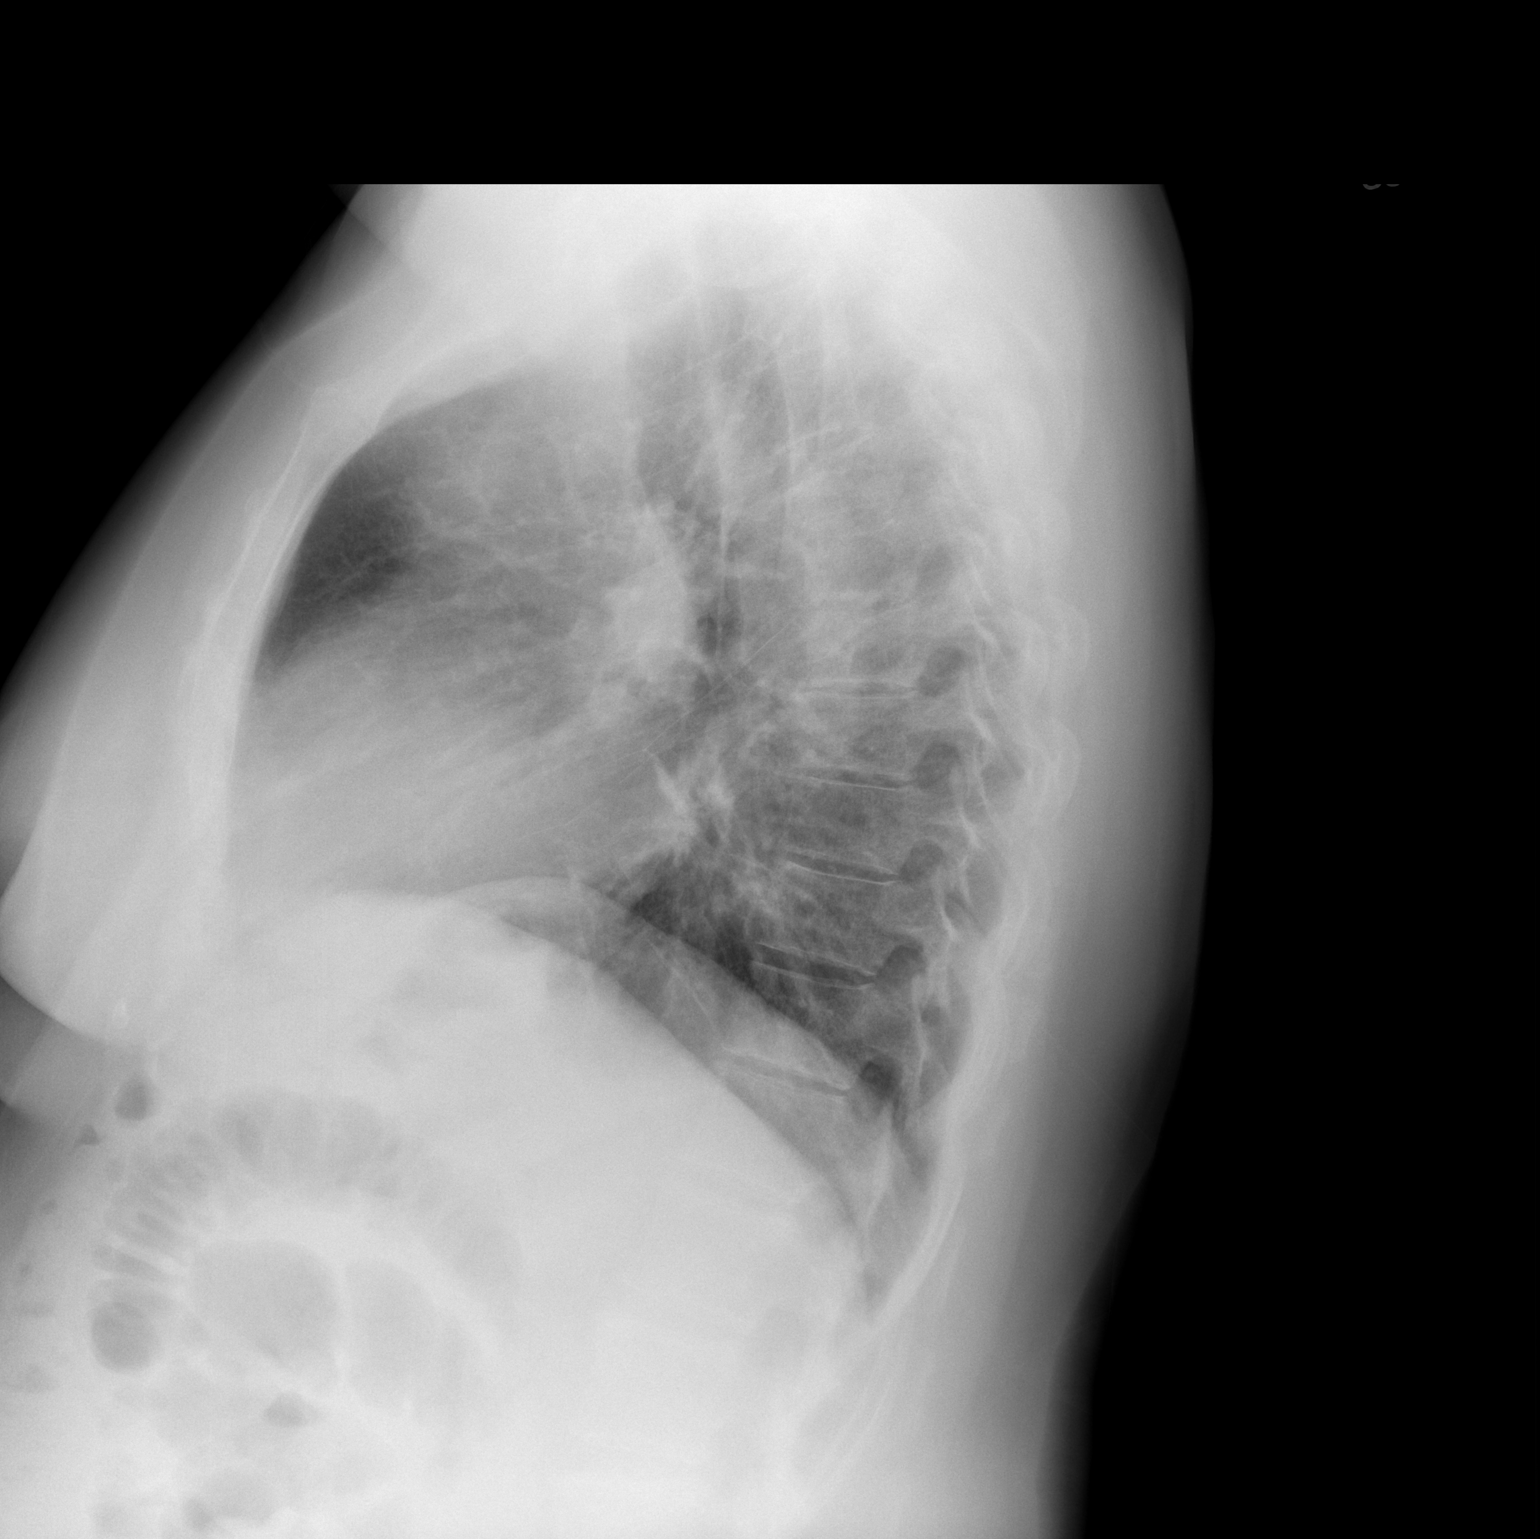

[2 of 2 positions shown; findings below may reference images not displayed]

FINDINGS: Normal heart size, mediastinal contours, and pulmonary vascularity.

Lungs clear.

No pulmonary infiltrate, pleural effusion, or pneumothorax.

Mild chronic eventration of LEFT diaphragm unchanged.

No acute osseous findings.
IMPRESSION: No acute abnormalities.

## 2021-02-15 ENCOUNTER — Telehealth: Payer: Self-pay | Admitting: Internal Medicine

## 2021-02-15 NOTE — Telephone Encounter (Signed)
  Per MyChart patient schedule message:  I want to schedule an ablation procedure soon. I had spoken with Dr. Rayann Heman on last visit about it and we decided that it was up to me, depending on frequency of Afib events going forward or even the aggravation that these events create for me and my family. If you want to schedule it and let me know when, that's fine. I cannot be there May 26  through June 8. Anytime after that it fine too.

## 2021-02-17 DIAGNOSIS — M25561 Pain in right knee: Secondary | ICD-10-CM | POA: Diagnosis not present

## 2021-02-17 DIAGNOSIS — M1712 Unilateral primary osteoarthritis, left knee: Secondary | ICD-10-CM | POA: Diagnosis not present

## 2021-02-17 DIAGNOSIS — M1711 Unilateral primary osteoarthritis, right knee: Secondary | ICD-10-CM | POA: Diagnosis not present

## 2021-02-17 DIAGNOSIS — M25562 Pain in left knee: Secondary | ICD-10-CM | POA: Diagnosis not present

## 2021-02-17 NOTE — Telephone Encounter (Signed)
Per Dr. Rayann Heman, okay to schedule for ablation with TEE.   Set up ablation and TEE. Advised the patient that instruction will be sent over mychart. If she has any questions to call or mychart the office. She verbalized understanding.

## 2021-02-22 ENCOUNTER — Encounter: Payer: Self-pay | Admitting: *Deleted

## 2021-02-22 NOTE — Telephone Encounter (Signed)
Left message to call back  

## 2021-02-25 ENCOUNTER — Telehealth: Payer: Self-pay | Admitting: Internal Medicine

## 2021-02-25 NOTE — Telephone Encounter (Signed)
New message   Pt states that she received mychart notification that she has a message. But she did not receive the letters.

## 2021-02-26 ENCOUNTER — Telehealth: Payer: Self-pay | Admitting: Cardiology

## 2021-02-26 NOTE — Telephone Encounter (Signed)
Left message to call back  

## 2021-02-26 NOTE — Telephone Encounter (Signed)
Patient was returning phone call 

## 2021-02-26 NOTE — Telephone Encounter (Signed)
Spoke to the patient and went over letter with ablation procedure that she was able to find in [place]. Explained that her TEE could not be schedule on the 4th because of the holiday like we discussed could occur previously and I would let her know if we had to make changes. (which we did and we have been playing phone tag every since) verbalized understanding and thankful that we got to speak today before the weekend.

## 2021-02-26 NOTE — Telephone Encounter (Signed)
See my chart message

## 2021-03-24 DIAGNOSIS — E1169 Type 2 diabetes mellitus with other specified complication: Secondary | ICD-10-CM | POA: Diagnosis not present

## 2021-03-24 DIAGNOSIS — R310 Gross hematuria: Secondary | ICD-10-CM | POA: Diagnosis not present

## 2021-03-24 DIAGNOSIS — I10 Essential (primary) hypertension: Secondary | ICD-10-CM | POA: Diagnosis not present

## 2021-03-24 DIAGNOSIS — E2839 Other primary ovarian failure: Secondary | ICD-10-CM | POA: Diagnosis not present

## 2021-03-24 DIAGNOSIS — R102 Pelvic and perineal pain: Secondary | ICD-10-CM | POA: Diagnosis not present

## 2021-04-02 NOTE — Pre-Procedure Instructions (Signed)
Attempted to call patient regarding procedure instructions for Tuesday.  Let voice mail on the following items:  Instructed patient on the following items: Arrival time 0700 for TEE Nothing to eat or drink after midnight No meds AM of procedure Responsible person to drive you home and stay with you for 24 hrs  Have you missed any doses of anti-coagulant Xarelto- don't miss any doses, don't take any Tuesday morning

## 2021-04-05 NOTE — Anesthesia Preprocedure Evaluation (Addendum)
Anesthesia Evaluation  Patient identified by MRN, date of birth, ID band Patient awake    Reviewed: Allergy & Precautions, NPO status , Patient's Chart, lab work & pertinent test results  History of Anesthesia Complications Negative for: history of anesthetic complications  Airway Mallampati: II  TM Distance: >3 FB Neck ROM: Full    Dental  (+) Teeth Intact, Dental Advisory Given   Pulmonary neg pulmonary ROS,    Pulmonary exam normal        Cardiovascular hypertension, Pt. 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  Aggressive medical management was recommended.  Reproductive/Obstetrics                           Anesthesia Physical Anesthesia Plan  ASA: 3  Anesthesia Plan: MAC   Post-op Pain Management:    Induction: Intravenous  PONV Risk Score and Plan: 2 and Propofol infusion, TIVA and Treatment may vary due to age or medical condition  Airway Management Planned: Natural Airway, Nasal Cannula and Simple Face Mask  Additional Equipment: None  Intra-op Plan:   Post-operative Plan:   Informed Consent: I have reviewed the patients History and Physical,  chart, labs and discussed the procedure including the risks, benefits and alternatives for the proposed anesthesia with the patient or authorized representative who has indicated his/her understanding and acceptance.     Dental advisory given  Plan Discussed with:   Anesthesia Plan Comments: (Patient for TEE prior to planned A fib ablation.)       Anesthesia Quick Evaluation

## 2021-04-06 ENCOUNTER — Encounter (HOSPITAL_COMMUNITY): Payer: Self-pay | Admitting: Cardiovascular Disease

## 2021-04-06 ENCOUNTER — Encounter (HOSPITAL_COMMUNITY)
Admission: RE | Disposition: A | Discharge status: Home / Self Care | Payer: Self-pay | Source: Home / Self Care | Attending: Cardiovascular Disease

## 2021-04-06 ENCOUNTER — Ambulatory Visit (HOSPITAL_COMMUNITY): Payer: Medicare Other | Admitting: Certified Registered"

## 2021-04-06 ENCOUNTER — Ambulatory Visit (HOSPITAL_COMMUNITY): Payer: Medicare Other | Admitting: Anesthesiology

## 2021-04-06 ENCOUNTER — Other Ambulatory Visit: Payer: Self-pay

## 2021-04-06 ENCOUNTER — Ambulatory Visit (HOSPITAL_BASED_OUTPATIENT_CLINIC_OR_DEPARTMENT_OTHER)
Admission: RE | Admit: 2021-04-06 | Discharge: 2021-04-06 | Disposition: A | Discharge status: Home / Self Care | Payer: Medicare Other | Source: Home / Self Care | Attending: Cardiovascular Disease | Admitting: Cardiovascular Disease

## 2021-04-06 ENCOUNTER — Ambulatory Visit (HOSPITAL_COMMUNITY)
Admission: RE | Admit: 2021-04-06 | Discharge: 2021-04-06 | Disposition: A | Discharge status: Home / Self Care | Payer: Medicare Other | Attending: Cardiovascular Disease | Admitting: Cardiovascular Disease

## 2021-04-06 DIAGNOSIS — Z888 Allergy status to other drugs, medicaments and biological substances status: Secondary | ICD-10-CM | POA: Insufficient documentation

## 2021-04-06 DIAGNOSIS — Z7984 Long term (current) use of oral hypoglycemic drugs: Secondary | ICD-10-CM | POA: Diagnosis not present

## 2021-04-06 DIAGNOSIS — Z7982 Long term (current) use of aspirin: Secondary | ICD-10-CM | POA: Diagnosis not present

## 2021-04-06 DIAGNOSIS — I700 Atherosclerosis of aorta: Secondary | ICD-10-CM | POA: Insufficient documentation

## 2021-04-06 DIAGNOSIS — E119 Type 2 diabetes mellitus without complications: Secondary | ICD-10-CM | POA: Diagnosis not present

## 2021-04-06 DIAGNOSIS — Z79899 Other long term (current) drug therapy: Secondary | ICD-10-CM | POA: Insufficient documentation

## 2021-04-06 DIAGNOSIS — Z881 Allergy status to other antibiotic agents status: Secondary | ICD-10-CM | POA: Diagnosis not present

## 2021-04-06 DIAGNOSIS — I4891 Unspecified atrial fibrillation: Secondary | ICD-10-CM | POA: Diagnosis not present

## 2021-04-06 DIAGNOSIS — E7800 Pure hypercholesterolemia, unspecified: Secondary | ICD-10-CM | POA: Diagnosis not present

## 2021-04-06 DIAGNOSIS — I480 Paroxysmal atrial fibrillation: Secondary | ICD-10-CM | POA: Diagnosis not present

## 2021-04-06 DIAGNOSIS — I10 Essential (primary) hypertension: Secondary | ICD-10-CM | POA: Insufficient documentation

## 2021-04-06 DIAGNOSIS — Z7901 Long term (current) use of anticoagulants: Secondary | ICD-10-CM | POA: Diagnosis not present

## 2021-04-06 DIAGNOSIS — I340 Nonrheumatic mitral (valve) insufficiency: Secondary | ICD-10-CM

## 2021-04-06 DIAGNOSIS — I083 Combined rheumatic disorders of mitral, aortic and tricuspid valves: Secondary | ICD-10-CM | POA: Diagnosis not present

## 2021-04-06 HISTORY — DX: Nausea with vomiting, unspecified: R11.2

## 2021-04-06 HISTORY — PX: TEE WITHOUT CARDIOVERSION: SHX5443

## 2021-04-06 HISTORY — DX: Nausea with vomiting, unspecified: Z98.890

## 2021-04-06 HISTORY — PX: ATRIAL FIBRILLATION ABLATION: EP1191

## 2021-04-06 LAB — POCT ACTIVATED CLOTTING TIME
Activated Clotting Time: 335 seconds
Activated Clotting Time: 387 seconds

## 2021-04-06 LAB — CBC
HCT: 44.4 % (ref 36.0–46.0)
Hemoglobin: 14.4 g/dL (ref 12.0–15.0)
MCH: 29.3 pg (ref 26.0–34.0)
MCHC: 32.4 g/dL (ref 30.0–36.0)
MCV: 90.2 fL (ref 80.0–100.0)
Platelets: 247 10*3/uL (ref 150–400)
RBC: 4.92 MIL/uL (ref 3.87–5.11)
RDW: 13.4 % (ref 11.5–15.5)
WBC: 8.1 10*3/uL (ref 4.0–10.5)
nRBC: 0 % (ref 0.0–0.2)

## 2021-04-06 LAB — BASIC METABOLIC PANEL
Anion gap: 13 (ref 5–15)
BUN: 15 mg/dL (ref 8–23)
CO2: 26 mmol/L (ref 22–32)
Calcium: 9.5 mg/dL (ref 8.9–10.3)
Chloride: 97 mmol/L — ABNORMAL LOW (ref 98–111)
Creatinine, Ser: 0.89 mg/dL (ref 0.44–1.00)
GFR, Estimated: >60 mL/min (ref >60)
Glucose, Bld: 114 mg/dL — ABNORMAL HIGH (ref 70–99)
Potassium: 3 mmol/L — ABNORMAL LOW (ref 3.5–5.1)
Sodium: 136 mmol/L (ref 135–145)

## 2021-04-06 LAB — GLUCOSE, CAPILLARY
Glucose-Capillary: 99 mg/dL (ref 70–99)
Glucose-Capillary: 138 mg/dL — ABNORMAL HIGH (ref 70–99)

## 2021-04-06 SURGERY — ATRIAL FIBRILLATION ABLATION
Anesthesia: General

## 2021-04-06 SURGERY — ECHOCARDIOGRAM, TRANSESOPHAGEAL
Anesthesia: Monitor Anesthesia Care

## 2021-04-06 MED ORDER — SODIUM CHLORIDE 0.9% FLUSH: 3.0000 mL | INTRAVENOUS | Status: DC | PRN

## 2021-04-06 MED ORDER — ACETAMINOPHEN 325 MG PO TABS
650.0000 mg | ORAL_TABLET | ORAL | Status: DC | PRN
  Administered 2021-04-06: 650 mg via ORAL

## 2021-04-06 MED ORDER — ACETAMINOPHEN 325 MG PO TABS
ORAL_TABLET | ORAL | Status: AC
  Filled 2021-04-06: qty 2

## 2021-04-06 MED ORDER — DEXAMETHASONE SODIUM PHOSPHATE 10 MG/ML IJ SOLN
INTRAMUSCULAR | Status: DC | PRN
  Administered 2021-04-06: 10 mg via INTRAVENOUS

## 2021-04-06 MED ORDER — ONDANSETRON HCL 4 MG/2ML IJ SOLN
4.0000 mg | Freq: Four times a day (QID) | INTRAMUSCULAR | Status: DC | PRN
Start: 2021-04-06 — End: 2021-04-06

## 2021-04-06 MED ORDER — HEPARIN SODIUM (PORCINE) 1000 UNIT/ML IJ SOLN
INTRAMUSCULAR | Status: AC
  Filled 2021-04-06: qty 1

## 2021-04-06 MED ORDER — HEPARIN SODIUM (PORCINE) 1000 UNIT/ML IJ SOLN
INTRAMUSCULAR | Status: DC | PRN
  Administered 2021-04-06: 1000 [IU] via INTRAVENOUS
  Administered 2021-04-06: 15000 [IU] via INTRAVENOUS

## 2021-04-06 MED ORDER — LACTATED RINGERS IV SOLN: INTRAVENOUS | Status: DC | PRN

## 2021-04-06 MED ORDER — HYDROCODONE-ACETAMINOPHEN 5-325 MG PO TABS: 1.0000 | ORAL_TABLET | ORAL | Status: DC | PRN

## 2021-04-06 MED ORDER — LIDOCAINE 2% (20 MG/ML) 5 ML SYRINGE
INTRAMUSCULAR | Status: DC | PRN
  Administered 2021-04-06: 100 mg via INTRAVENOUS

## 2021-04-06 MED ORDER — PROPOFOL 500 MG/50ML IV EMUL
INTRAVENOUS | Status: DC | PRN
  Administered 2021-04-06: 125 ug/kg/min via INTRAVENOUS

## 2021-04-06 MED ORDER — ROCURONIUM BROMIDE 10 MG/ML (PF) SYRINGE
PREFILLED_SYRINGE | INTRAVENOUS | Status: DC | PRN
  Administered 2021-04-06: 20 mg via INTRAVENOUS
  Administered 2021-04-06: 60 mg via INTRAVENOUS

## 2021-04-06 MED ORDER — ONDANSETRON HCL 4 MG/2ML IJ SOLN
INTRAMUSCULAR | Status: DC | PRN
  Administered 2021-04-06: 4 mg via INTRAVENOUS

## 2021-04-06 MED ORDER — ISOPROTERENOL HCL 0.2 MG/ML IJ SOLN
INTRAMUSCULAR | Status: AC
  Filled 2021-04-06: qty 5

## 2021-04-06 MED ORDER — PROPOFOL 10 MG/ML IV BOLUS
INTRAVENOUS | Status: DC | PRN
  Administered 2021-04-06: 50 mg via INTRAVENOUS
  Administered 2021-04-06: 150 mg via INTRAVENOUS

## 2021-04-06 MED ORDER — SODIUM CHLORIDE 0.9 % IV SOLN: 250.0000 mL | INTRAVENOUS | Status: DC | PRN

## 2021-04-06 MED ORDER — POTASSIUM CHLORIDE 10 MEQ/100ML IV SOLN
10.0000 meq | INTRAVENOUS | Status: AC
  Administered 2021-04-06 (×4): 10 meq via INTRAVENOUS
  Filled 2021-04-06 (×4): qty 100

## 2021-04-06 MED ORDER — SUGAMMADEX SODIUM 200 MG/2ML IV SOLN
INTRAVENOUS | Status: DC | PRN
  Administered 2021-04-06: 200 mg via INTRAVENOUS

## 2021-04-06 MED ORDER — SODIUM CHLORIDE 0.9% FLUSH: 3.0000 mL | Freq: Two times a day (BID) | INTRAVENOUS | Status: DC

## 2021-04-06 MED ORDER — SODIUM CHLORIDE 0.9 % IV SOLN: INTRAVENOUS | Status: DC

## 2021-04-06 MED ORDER — LIDOCAINE 2% (20 MG/ML) 5 ML SYRINGE
INTRAMUSCULAR | Status: DC | PRN
  Administered 2021-04-06 (×2): 50 mg via INTRAVENOUS

## 2021-04-06 MED ORDER — SODIUM CHLORIDE 0.9 % IV SOLN
INTRAVENOUS | Status: AC | PRN
  Administered 2021-04-06: 500 mL via INTRAVENOUS

## 2021-04-06 MED ORDER — ISOPROTERENOL HCL 0.2 MG/ML IJ SOLN
INTRAVENOUS | Status: DC | PRN
  Administered 2021-04-06: 20 ug/min via INTRAVENOUS

## 2021-04-06 MED ORDER — PROPOFOL 10 MG/ML IV BOLUS
INTRAVENOUS | Status: DC | PRN
  Administered 2021-04-06: 25 mg via INTRAVENOUS
  Administered 2021-04-06: 75 mg via INTRAVENOUS
  Administered 2021-04-06: 50 mg via INTRAVENOUS

## 2021-04-06 MED ORDER — HEPARIN (PORCINE) IN NACL 1000-0.9 UT/500ML-% IV SOLN
INTRAVENOUS | Status: DC | PRN
  Administered 2021-04-06: 500 mL

## 2021-04-06 MED ORDER — HEPARIN (PORCINE) IN NACL 1000-0.9 UT/500ML-% IV SOLN
INTRAVENOUS | Status: AC
  Filled 2021-04-06: qty 500

## 2021-04-06 MED ORDER — PANTOPRAZOLE SODIUM 40 MG PO TBEC: 40.0000 mg | DELAYED_RELEASE_TABLET | Freq: Every day | ORAL | 0 refills | Status: DC

## 2021-04-06 SURGICAL SUPPLY — 20 items
BLANKET WARM UNDERBOD FULL ACC (MISCELLANEOUS) ×3 IMPLANT
CATH MAPPNG PENTARAY F 2-6-2MM (CATHETERS) IMPLANT
CATH SMTCH THERMOCOOL SF DF (CATHETERS) ×2 IMPLANT
CATH SOUNDSTAR ECO 8FR (CATHETERS) ×2 IMPLANT
CATH WEBSTER BI DIR CS D-F CRV (CATHETERS) ×2 IMPLANT
CLOSURE PERCLOSE PROSTYLE (VASCULAR PRODUCTS) ×8 IMPLANT
COVER SWIFTLINK CONNECTOR (BAG) ×3 IMPLANT
MAT PREVALON FULL STRYKER (MISCELLANEOUS) ×2 IMPLANT
NDL BAYLIS TRANSSEPTAL 71CM (NEEDLE) IMPLANT
NEEDLE BAYLIS TRANSSEPTAL 71CM (NEEDLE) ×3 IMPLANT
PACK EP LATEX FREE (CUSTOM PROCEDURE TRAY) ×3
PACK EP LF (CUSTOM PROCEDURE TRAY) ×1 IMPLANT
PAD PRO RADIOLUCENT 2001M-C (PAD) ×3 IMPLANT
PATCH CARTO3 (PAD) ×2 IMPLANT
PENTARAY F 2-6-2MM (CATHETERS) ×3
SHEATH BAYLIS TORFLEX (SHEATH) ×2 IMPLANT
SHEATH PINNACLE 7F 10CM (SHEATH) ×4 IMPLANT
SHEATH PINNACLE 9F 10CM (SHEATH) ×2 IMPLANT
SHEATH PROBE COVER 6X72 (BAG) ×2 IMPLANT
TUBING SMART ABLATE COOLFLOW (TUBING) ×2 IMPLANT

## 2021-04-06 NOTE — Progress Notes (Signed)
  Echocardiogram Echocardiogram Transesophageal has been performed.  Fidel Levy 04/06/2021, 8:45 AM

## 2021-04-06 NOTE — H&P (Signed)
Cardiology Admission History and Physical:  Patient ID: Lindsey Jordan MRN: [id number] DOB: 1951-03-15  Admit date: 04/06/2021  Primary Care Provider: Dalena Ada, MD Primary Cardiologist: Candee Furbish, MD  Primary Electrophysiologist:  None   Chief Complaint:  Atrial fibrillation  Patient Profile:  Lindsey Jordan is a 70 y.o. female with paroxysmal Afib on xarelto. Here for TEE prior to Afib ablation. NPO. No difficulties swallowing. No loose teeth. Allergies reviewed. Stable exam.   Heart Pathway Score:       Past Medical History: Past Medical History:  Diagnosis Date   Diabetes mellitus without complication ([place])    Hypertension    Kidney stones    Morbid obesity ([place])    Paroxysmal atrial fibrillation ([facility])    PONV (postoperative nausea and vomiting)     Past Surgical History: Past Surgical History:  Procedure Laterality Date   ABDOMINAL HYSTERECTOMY  2003   ADENOIDECTOMY     age 42   BREAST LUMPECTOMY     x 2 in her 20's   BREAST SURGERY     CESAREAN SECTION     one previous   ECTOPIC PREGNANCY SURGERY     HEMORRHOID SURGERY     in 20's   LEFT HEART CATH AND CORONARY ANGIOGRAPHY N/A 11/23/2020   Procedure: LEFT HEART CATH AND CORONARY ANGIOGRAPHY;  Surgeon: Belva Crome, MD;  Location: [facility];  Service: Cardiovascular;  Laterality: N/A;   TONSILLECTOMY       Medications Prior to Admission: Prior to Admission medications   Medication Sig Start Date End Date Taking? Authorizing Provider  acyclovir (ZOVIRAX) 200 MG capsule Take 200 mg by mouth See admin instructions. Take 200 mg by mouth in the morning & take 200 mg by mouth 3 times daily if outbreak presents   Yes [provider]  aspirin EC 81 MG tablet Take 81 mg by mouth daily. Swallow whole.   Yes [provider]  clonazePAM (KLONOPIN) 1 MG tablet Take 1 mg by mouth at bedtime.   Yes [provider]  diltiazem (CARDIZEM CD) 240 MG 24 hr capsule Take 1 capsule  (240 mg total) by mouth daily. 10/30/20  Yes Jerline Pain, MD  diltiazem (CARDIZEM) 60 MG tablet Take 1 tablet (60 mg total) by mouth 4 (four) times daily as needed. As needed for break through At Fib Patient taking differently: Take 60 mg by mouth 4 (four) times daily as needed (for break through At Fib). 10/30/20  Yes Jerline Pain, MD  escitalopram (LEXAPRO) 10 MG tablet Take 10 mg by mouth at bedtime.   Yes [provider]  Eszopiclone 3 MG TABS Take 3 mg by mouth at bedtime. Take immediately before bedtime   Yes [provider]  Evolocumab (REPATHA SURECLICK) 683 MG/ML SOAJ Inject 1 pen into the skin every 14 (fourteen) days. 11/02/20  Yes Jerline Pain, MD  fluticasone (FLONASE) 50 MCG/ACT nasal spray Place 1 spray into both nostrils daily.   Yes [provider]  Ibuprofen-diphenhydrAMINE HCl (ADVIL PM) 200-25 MG CAPS Take 3 tablets by mouth at bedtime.   Yes [provider]  levocetirizine (XYZAL) 5 MG tablet Take 5 mg by mouth every evening.   Yes [provider]  metFORMIN (GLUCOPHAGE-XR) 500 MG 24 hr tablet Take 500 mg by mouth at bedtime.   Yes [provider]  rivaroxaban (XARELTO) 20 MG TABS tablet Take 1 tablet (20 mg total) by mouth daily with supper. 10/30/20  Yes  Jerline Pain, MD  traZODone (DESYREL) 100 MG tablet Take 100 mg by mouth at bedtime.   Yes [provider]  triamterene-hydrochlorothiazide (MAXZIDE-25) 37.5-25 MG per tablet Take 1 tablet by mouth daily.   Yes [provider]  albuterol (VENTOLIN HFA) 108 (90 Base) MCG/ACT inhaler Inhale 2 puffs into the lungs every 6 (six) hours as needed for wheezing or shortness of breath. 01/05/21   [provider]  estradiol (ESTRACE) 0.1 MG/GM vaginal cream Place 1 Applicatorful vaginally daily. 03/24/21   [provider]  nitroGLYCERIN (NITROSTAT) 0.4 MG SL tablet PLACE 1 TABLET UNDER THE TONGUE EVERY 5 MINUTES X 3 DOSES AS NEEDED FOR CHEST  PAIN. Patient taking differently: Place 0.4 mg under the tongue every 5 (five) minutes as needed for chest pain. 01/06/21   Thompson Grayer, MD     Allergies:    Allergies  Allergen Reactions   Biaxin [Clarithromycin]     Extreme stomach discomfort   Statins     Severe muscle aches with Lipitor (1998) and Crestor (2008)   Welchol [Colesevelam Hcl]     Side effects - muscle and GI    Social History:   Social History   Socioeconomic History   Marital status: Married    Spouse name: Not on file   Number of children: Not on file   Years of education: Not on file   Highest education level: Not on file  Occupational History   Not on file  Tobacco Use   Smoking status: Never   Smokeless tobacco: Never  Substance and Sexual Activity   Alcohol use: No   Drug use: No   Sexual activity: Yes    Birth control/protection: Surgical  Other Topics Concern   Not on file  Social History Narrative   Not on file   Social Determinants of Health   Financial Resource Strain: Not on file  Food Insecurity: Not on file  Transportation Needs: Not on file  Physical Activity: Not on file  Stress: Not on file  Social Connections: Not on file  Intimate Partner Violence: Not on file     Family History:   The patient's family history includes Breast cancer in her maternal aunt; Hypertension in her father.    ROS:  All other ROS reviewed and negative. Pertinent positives noted in the HPI.     Physical Exam/Data:  There were no vitals filed for this visit. No intake or output data in the 24 hours ending 04/06/21 0713  Last 3 Weights 02/03/2021 02/01/2021 12/17/2020  Weight (lbs) 246 lb 9.6 oz 245 lb 248 lb 9.6 oz  Weight (kg) 111.857 kg 111.131 kg 112.764 kg    There is no height or weight on file to calculate BMI.  General: Well nourished, well developed, in no acute distress Head: Atraumatic, normal size  Eyes: PEERLA, EOMI  Neck: Supple, no JVD Endocrine: No thryomegaly Cardiac: Normal S1,  S2; RRR; no murmurs, rubs, or gallops Lungs: Clear to auscultation bilaterally, no wheezing, rhonchi or rales  Abd: Soft, nontender, no hepatomegaly  Ext: No edema, pulses 2+ Musculoskeletal: No deformities, BUE and BLE strength normal and equal Skin: Warm and dry, no rashes   Neuro: Alert and oriented to person, place, time, and situation, CNII-XII grossly intact, no focal deficits  Psych: Normal mood and affect   Laboratory Data: High Sensitivity Troponin:  No results for input(s): TROPONINIHS in the last 720 hours.    Cardiac EnzymesNo results for input(s): TROPONINI in the last  168 hours. No results for input(s): TROPIPOC in the last 168 hours.  ChemistryNo results for input(s): NA, K, CL, CO2, GLUCOSE, BUN, CREATININE, CALCIUM, GFRNONAA, GFRAA, ANIONGAP in the last 168 hours.  No results for input(s): PROT, ALBUMIN, AST, ALT, ALKPHOS, BILITOT in the last 168 hours. HematologyNo results for input(s): WBC, RBC, HGB, HCT, MCV, MCH, MCHC, RDW, PLT in the last 168 hours. BNPNo results for input(s): BNP, PROBNP in the last 168 hours.  DDimer No results for input(s): DDIMER in the last 168 hours.  Radiology/Studies:  No results found.  Assessment and Plan:  Atrial fibrillation -NPO for TEE prior to Afib ablation.  -labs ordered.    Signed, Addison Naegeli. Audie Box, MD, [place]  04/06/2021 7:13 AM

## 2021-04-06 NOTE — CV Procedure (Signed)
    TRANSESOPHAGEAL ECHOCARDIOGRAM   NAME:  Lindsey Jordan    MRN: [id number] DOB:  06/24/1951    ADMIT DATE: 04/06/2021  INDICATIONS: Pre-Ablation   PROCEDURE:   Informed consent was obtained prior to the procedure. The risks, benefits and alternatives for the procedure were discussed and the patient comprehended these risks.  Risks include, but are not limited to, cough, sore throat, vomiting, nausea, somnolence, esophageal and stomach trauma or perforation, bleeding, low blood pressure, aspiration, pneumonia, infection, trauma to the teeth and death.    Procedural time out performed. The oropharynx was anesthetized with topical 1% benzocaine.    Anesthesia was administered by Dr. Kerin Perna.  The patient was administered 250 mg of propofol and 100 mg of lidocaine to achieve and maintain moderate conscious sedation.  The patient's heart rate, blood pressure, and oxygen saturation are monitored continuously during the procedure. The period of conscious sedation is 12 minutes, of which I was present face-to-face 100% of this time.   The transesophageal probe was inserted in the esophagus and stomach without difficulty and multiple views were obtained.   COMPLICATIONS:    There were no immediate complications.  KEY FINDINGS:  No LA/LAA thrombus.  Normal LV/RV function.  Full report to follow. Further management per primary team.   [place] T. Audie Box, MD, [facility]  [address], [place], [place] [address] [phone]  8:26 AM

## 2021-04-06 NOTE — Anesthesia Postprocedure Evaluation (Signed)
Anesthesia Post Note  Patient: Lindsey Jordan  Procedure(s) Performed: ATRIAL FIBRILLATION ABLATION     Patient location during evaluation: [facility] Recovery Anesthesia Type: General Level of consciousness: awake and alert Pain management: pain level controlled Vital Signs Assessment: post-procedure vital signs reviewed and stable Respiratory status: spontaneous breathing, nonlabored ventilation and respiratory function stable Cardiovascular status: blood pressure returned to baseline and stable Postop Assessment: no apparent nausea or vomiting Anesthetic complications: no   No notable events documented.  Last Vitals:  Vitals:   04/06/21 1325 04/06/21 1330  BP: (!) 157/57 (!) 150/58  Pulse: 81 83  Resp: 15 14  Temp: (!) 36.3 C   SpO2: 97% 97%    Last Pain:  Vitals:   04/06/21 1325  TempSrc: Temporal  PainSc: 3                  Lidia Collum

## 2021-04-06 NOTE — Discharge Instructions (Signed)
Post procedure care instructions No driving for 4 days. No lifting over 5 lbs for 1 week. No vigorous or sexual activity for 1 week. You may return to work/your usual activities on 04/14/21. Keep procedure site clean & dry. If you notice increased pain, swelling, bleeding or pus, call/return!  You may shower after 24 hours, but no soaking in baths/hot tubs/pools for 1 week.     You have an appointment set up with the [facility].  Multiple studies have shown that being followed by a dedicated atrial fibrillation clinic in addition to the standard care you receive from your other physicians improves health. We believe that enrollment in the atrial fibrillation clinic will allow [place] to better care for you.   The phone number to the [facility] is [phone]. The clinic is staffed Monday through Friday from 8:30am to 5pm.  Parking Directions: The clinic is located in the Heart and Vascular Building connected to [facility]. 1)From [address] turn on to [address] and go to the 3rd entrance  (Heart and Vascular entrance) on the right. 2)Look to the right for Heart &Vascular Parking Garage. 3)A code for the entrance is required, for August is 5544.   4)Take the elevators to the 1st floor. Registration is in the room with the glass walls at the end of the hallway.  If you have any trouble parking or locating the clinic, please don't hesitate to call [phone].

## 2021-04-06 NOTE — H&P (Signed)
PCP: Karyss Ada, MD Primary Cardiologist: Dr Marlou Porch Primary EP: Dr Guss Bunde is a 70 y.o. female who presents today for routine electrophysiology study and ablation of afib.  Since last being seen in our clinic, the patient reports doing very well.  Today, she denies symptoms of palpitations, chest pain, shortness of breath,  lower extremity edema, dizziness, presyncope, or syncope.  The patient is otherwise without complaint today.       Past Medical History:  Diagnosis Date   Diabetes mellitus without complication ([place])     Hypertension     Kidney stones     Morbid obesity ([place])     Paroxysmal atrial fibrillation ([place])           Past Surgical History:  Procedure Laterality Date   ABDOMINAL HYSTERECTOMY   2003   ADENOIDECTOMY        age 44   BREAST LUMPECTOMY        x 2 in her 90's   BREAST SURGERY       CESAREAN SECTION        one previous   ECTOPIC PREGNANCY SURGERY       HEMORRHOID SURGERY        in 20's   LEFT HEART CATH AND CORONARY ANGIOGRAPHY N/A 11/23/2020    Procedure: LEFT HEART CATH AND CORONARY ANGIOGRAPHY;  Surgeon: Belva Crome, MD;  Location: [facility];  Service: Cardiovascular;  Laterality: N/A;   TONSILLECTOMY          ROS- all systems are reviewed and negatives except as per HPI above         Current Outpatient Medications  Medication Sig Dispense Refill   acyclovir (ZOVIRAX) 200 MG capsule Take 200 mg by mouth See admin instructions. Take 1 capsule (200 mg) by mouth in the morning & take 1 capsule (200 mg) by mouth 3 times daily if outbreak presents       albuterol (VENTOLIN HFA) 108 (90 Base) MCG/ACT inhaler Inhale 2 puffs into the lungs as needed.       aspirin EC 81 MG tablet Take 1 tablet (81 mg total) by mouth daily. Swallow whole. 90 tablet 3   clonazePAM (KLONOPIN) 1 MG tablet Take 1 mg by mouth at bedtime.       diltiazem (CARDIZEM CD) 240 MG 24 hr capsule Take 1 capsule (240 mg total) by mouth daily. 90 capsule 3    diltiazem (CARDIZEM) 60 MG tablet Take 1 tablet (60 mg total) by mouth 4 (four) times daily as needed. As needed for break through At Fib 30 tablet prn   escitalopram (LEXAPRO) 10 MG tablet Take 10 mg by mouth at bedtime.       Evolocumab (REPATHA SURECLICK) 856 MG/ML SOAJ Inject 1 pen into the skin every 14 (fourteen) days. 6 mL 3   fluticasone (FLONASE) 50 MCG/ACT nasal spray Place 1 spray into both nostrils daily.       Ibuprofen-diphenhydrAMINE HCl (ADVIL PM) 200-25 MG CAPS Take 3 tablets by mouth at bedtime.       levocetirizine (XYZAL) 5 MG tablet Take 5 mg by mouth every evening.       metFORMIN (GLUCOPHAGE-XR) 500 MG 24 hr tablet Take 500 mg by mouth every evening.       nitroGLYCERIN (NITROSTAT) 0.4 MG SL tablet PLACE 1 TABLET UNDER THE TONGUE EVERY 5 MINUTES X 3 DOSES AS NEEDED FOR CHEST PAIN. 25 tablet 2   rivaroxaban (XARELTO) 20  MG TABS tablet Take 1 tablet (20 mg total) by mouth daily with supper. 30 tablet 6   traZODone (DESYREL) 100 MG tablet Take 100 mg by mouth at bedtime.       triamterene-hydrochlorothiazide (MAXZIDE-25) 37.5-25 MG per tablet Take 1 tablet by mouth daily.        No current facility-administered medications for this visit.      Physical Exam:    Vitals:    02/03/21 1210  BP: 124/68  Pulse: 86  SpO2: 97%  Weight: 246 lb 9.6 oz (111.9 kg)  Height: 5\' 8"  (1.727 m)      GEN- The patient is well appearing, alert and oriented x 3 today.   Head- normocephalic, atraumatic Eyes-  Sclera clear, conjunctiva pink Ears- hearing intact Oropharynx- clear Lungs-   normal work of breathing Heart- Regular rate and rhythm GI- soft Extremities- no clubbing, cyanosis, or edema      Wt Readings from Last 3 Encounters:  02/03/21 246 lb 9.6 oz (111.9 kg)  02/01/21 245 lb (111.1 kg)  12/17/20 248 lb 9.6 oz (112.8 kg)     TEE today is reviewed today and shows no LAA thrombus.   Assessment and Plan:   1. Paroxysmal atrial fibrillation  The patient has  symptomatic, recurrent atrial fibrillation.  Chads2vasc score is 5.  she is anticoagulated with xarelto . Therapeutic strategies for afib including medicine and ablation were discussed in detail with the patient today. Risk, benefits, and alternatives to EP study and radiofrequency ablation for afib were also discussed in detail today. These risks include but are not limited to stroke, bleeding, vascular damage, tamponade, perforation, damage to the esophagus, lungs, and other structures, pulmonary vein stenosis, worsening renal function, and death. The patient understands these risk and wishes to proceed.    Thompson Grayer MD, [place] 04/06/2021 9:56 AM

## 2021-04-06 NOTE — Anesthesia Preprocedure Evaluation (Signed)
Anesthesia Evaluation  Patient identified by MRN, date of birth, ID band Patient awake    Reviewed: Allergy & Precautions, NPO status , Patient's Chart, lab work & pertinent test results  History of Anesthesia Complications Negative for: history of anesthetic complications  Airway Mallampati: II  TM Distance: >3 FB Neck ROM: Full    Dental  (+) Teeth Intact, Dental Advisory Given   Pulmonary neg pulmonary ROS,    Pulmonary exam normal        Cardiovascular hypertension, Pt. 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  Aggressive medical management was recommended.  Reproductive/Obstetrics                             Anesthesia Physical  Anesthesia Plan  ASA: 3  Anesthesia Plan: General   Post-op Pain Management:    Induction: Intravenous  PONV Risk Score and Plan: 3 and Ondansetron, Dexamethasone, Treatment may vary due to age or medical condition and Midazolam  Airway Management Planned: Oral ETT  Additional Equipment: None  Intra-op Plan:   Post-operative Plan: Extubation in OR  Informed Consent: I have reviewed the patients History and Physical,  chart, labs and discussed the procedure including the risks, benefits and alternatives for the proposed anesthesia with the patient or authorized representative who has indicated his/her understanding and acceptance.     Dental advisory given  Plan Discussed with:   Anesthesia Plan Comments:         Anesthesia Quick Evaluation

## 2021-04-06 NOTE — Transfer of Care (Signed)
Immediate Anesthesia Transfer of Care Note  Patient: Lindsey Jordan  Procedure(s) Performed: TRANSESOPHAGEAL ECHOCARDIOGRAM (TEE)  Patient Location: PACU  Anesthesia Type:MAC  Level of Consciousness: awake, alert  and oriented  Airway & Oxygen Therapy: Patient Spontanous Breathing and Patient connected to nasal cannula oxygen  Post-op Assessment: Report given to RN and Post -op Vital signs reviewed and stable  Post vital signs: Reviewed and stable  Last Vitals:  Vitals Value Taken Time  BP 139/64 04/06/21 0833  Temp    Pulse 80 04/06/21 0836  Resp 19 04/06/21 0836  SpO2 95 % 04/06/21 0836  Vitals shown include unvalidated device data.  Last Pain:  Vitals:   04/06/21 0833  TempSrc:   PainSc: 0-No pain         Complications: No notable events documented.

## 2021-04-06 NOTE — Anesthesia Postprocedure Evaluation (Signed)
Anesthesia Post Note  Patient: Lindsey Jordan  Procedure(s) Performed: TRANSESOPHAGEAL ECHOCARDIOGRAM (TEE)     Patient location during evaluation: Endoscopy Anesthesia Type: MAC Level of consciousness: awake and alert Pain management: pain level controlled Vital Signs Assessment: post-procedure vital signs reviewed and stable Respiratory status: spontaneous breathing, nonlabored ventilation and respiratory function stable Cardiovascular status: blood pressure returned to baseline and stable Postop Assessment: no apparent nausea or vomiting Anesthetic complications: no   No notable events documented.  Last Vitals:  Vitals:   04/06/21 0833 04/06/21 0846  BP: 139/64 (!) 148/71  Pulse: 82 81  Resp: 20 18  Temp: 36.6 C   SpO2: 94% 99%    Last Pain:  Vitals:   04/06/21 0846  TempSrc:   PainSc: 0-No pain                 Lidia Collum

## 2021-04-06 NOTE — Anesthesia Procedure Notes (Signed)
Procedure Name: Intubation Date/Time: 04/06/2021 10:48 AM Performed by: Georgia Duff, CRNA Pre-anesthesia Checklist: Patient identified, Emergency Drugs available, Suction available and Patient being monitored Patient Re-evaluated:Patient Re-evaluated prior to induction Oxygen Delivery Method: Circle System Utilized Preoxygenation: Pre-oxygenation with 100% oxygen Induction Type: IV induction Ventilation: Mask ventilation without difficulty Laryngoscope Size: Glidescope and 3 Grade View: Grade I Tube type: Oral Tube size: 7.0 mm Number of attempts: 1 Airway Equipment and Method: Stylet and Oral airway Placement Confirmation: ETT inserted through vocal cords under direct vision, positive ETCO2 and breath sounds checked- equal and bilateral Secured at: 21 cm Tube secured with: Tape Dental Injury: Teeth and Oropharynx as per pre-operative assessment  Difficulty Due To: Difficulty was unanticipated Comments: Attempt with Miller blade, airway is anterior with small mouth opening

## 2021-04-06 NOTE — Transfer of Care (Signed)
Immediate Anesthesia Transfer of Care Note  Patient: Lindsey Jordan  Procedure(s) Performed: ATRIAL FIBRILLATION ABLATION  Patient Location: PACU  Anesthesia Type:General  Level of Consciousness: drowsy and patient cooperative  Airway & Oxygen Therapy: Patient Spontanous Breathing  Post-op Assessment: Report given to RN and Post -op Vital signs reviewed and stable  Post vital signs: Reviewed and stable  Last Vitals:  Vitals Value Taken Time  BP 175/65 04/06/21 1254  Temp    Pulse 89 04/06/21 1257  Resp 14 04/06/21 1257  SpO2 92 % 04/06/21 1257  Vitals shown include unvalidated device data.  Last Pain:  Vitals:   04/06/21 0846  TempSrc:   PainSc: 0-No pain         Complications: No notable events documented.

## 2021-04-06 NOTE — Progress Notes (Signed)
Client c/o  hand hurting after K + started iv and Dr Rayann Heman in and ok to d/c K+ iv

## 2021-04-07 ENCOUNTER — Encounter (HOSPITAL_COMMUNITY): Payer: Self-pay | Admitting: Internal Medicine

## 2021-04-26 DIAGNOSIS — F419 Anxiety disorder, unspecified: Secondary | ICD-10-CM | POA: Diagnosis not present

## 2021-04-26 DIAGNOSIS — G4700 Insomnia, unspecified: Secondary | ICD-10-CM | POA: Diagnosis not present

## 2021-04-29 DIAGNOSIS — B078 Other viral warts: Secondary | ICD-10-CM | POA: Diagnosis not present

## 2021-04-29 DIAGNOSIS — L821 Other seborrheic keratosis: Secondary | ICD-10-CM | POA: Diagnosis not present

## 2021-04-29 DIAGNOSIS — D1801 Hemangioma of skin and subcutaneous tissue: Secondary | ICD-10-CM | POA: Diagnosis not present

## 2021-04-29 DIAGNOSIS — L814 Other melanin hyperpigmentation: Secondary | ICD-10-CM | POA: Diagnosis not present

## 2021-04-29 DIAGNOSIS — Z85828 Personal history of other malignant neoplasm of skin: Secondary | ICD-10-CM | POA: Diagnosis not present

## 2021-04-29 DIAGNOSIS — D2271 Melanocytic nevi of right lower limb, including hip: Secondary | ICD-10-CM | POA: Diagnosis not present

## 2021-05-04 DIAGNOSIS — K648 Other hemorrhoids: Secondary | ICD-10-CM | POA: Diagnosis not present

## 2021-05-04 DIAGNOSIS — Z8371 Family history of colonic polyps: Secondary | ICD-10-CM | POA: Diagnosis not present

## 2021-05-04 DIAGNOSIS — K5730 Diverticulosis of large intestine without perforation or abscess without bleeding: Secondary | ICD-10-CM | POA: Diagnosis not present

## 2021-05-06 ENCOUNTER — Ambulatory Visit (HOSPITAL_COMMUNITY)
Admission: RE | Admit: 2021-05-06 | Discharge: 2021-05-06 | Disposition: A | Discharge status: Home / Self Care | Payer: Medicare Other | Source: Ambulatory Visit | Attending: Internal Medicine | Admitting: Internal Medicine

## 2021-05-06 ENCOUNTER — Other Ambulatory Visit: Payer: Self-pay

## 2021-05-06 ENCOUNTER — Encounter (HOSPITAL_COMMUNITY): Payer: Self-pay | Admitting: Physician Assistant

## 2021-05-06 VITALS — BP 124/74 | HR 98 | Ht 68.0 in | Wt 239.0 lb

## 2021-05-06 DIAGNOSIS — E119 Type 2 diabetes mellitus without complications: Secondary | ICD-10-CM | POA: Insufficient documentation

## 2021-05-06 DIAGNOSIS — I480 Paroxysmal atrial fibrillation: Secondary | ICD-10-CM | POA: Diagnosis not present

## 2021-05-06 DIAGNOSIS — Z6836 Body mass index (BMI) 36.0-36.9, adult: Secondary | ICD-10-CM | POA: Diagnosis not present

## 2021-05-06 DIAGNOSIS — Z8249 Family history of ischemic heart disease and other diseases of the circulatory system: Secondary | ICD-10-CM | POA: Diagnosis not present

## 2021-05-06 DIAGNOSIS — Z79899 Other long term (current) drug therapy: Secondary | ICD-10-CM | POA: Diagnosis not present

## 2021-05-06 DIAGNOSIS — D6869 Other thrombophilia: Secondary | ICD-10-CM | POA: Diagnosis not present

## 2021-05-06 DIAGNOSIS — E669 Obesity, unspecified: Secondary | ICD-10-CM | POA: Insufficient documentation

## 2021-05-06 DIAGNOSIS — I10 Essential (primary) hypertension: Secondary | ICD-10-CM | POA: Diagnosis not present

## 2021-05-06 DIAGNOSIS — E785 Hyperlipidemia, unspecified: Secondary | ICD-10-CM | POA: Diagnosis not present

## 2021-05-06 DIAGNOSIS — I2510 Atherosclerotic heart disease of native coronary artery without angina pectoris: Secondary | ICD-10-CM | POA: Insufficient documentation

## 2021-05-06 DIAGNOSIS — Z7984 Long term (current) use of oral hypoglycemic drugs: Secondary | ICD-10-CM | POA: Diagnosis not present

## 2021-05-06 DIAGNOSIS — Z7901 Long term (current) use of anticoagulants: Secondary | ICD-10-CM | POA: Insufficient documentation

## 2021-05-06 NOTE — Progress Notes (Signed)
Primary Care Physician: Nanette Ada, MD Primary Cardiologist: Dr Marlou Porch Primary Electrophysiologist: Dr Rayann Heman  Referring Physician: Dr Guss Bunde is a 70 y.o. female with a history of HLD, HTN, DM, CAD, and atrial fibrillation who presents for follow up in the [facility]. The patient was initially diagnosed with atrial fibrillation 10/29/20 after presenting with symptoms of tachypalpitations and chest pain. Patient is on Xarelto for a CHADS2VASC score of 5. She had a LHC which showed total occlusion of the mid to distal OM1 beyond bifurcation, diffuse 50% mid LAD, Proximal to mid first diagonal segmental 50% narrowing. Dominant RCA with 50% segmental mid narrowing and mid PDA 85% stenosis. Aggressive medical management was recommended. She denies any significant alcohol use.  On follow up today, patient is s/p afib ablation with Dr Rayann Heman on 04/06/21. She reports that she has done very well since the procedure with more energy. She did have a few brief episodes of heart racing but these have resolved. She denies CP, swallowing pain, or groin issues.   Today, she denies symptoms of chest pain, shortness of breath, orthopnea, PND, lower extremity edema, dizziness, presyncope, syncope, snoring, daytime somnolence, bleeding, or neurologic sequela. The patient is tolerating medications without difficulties and is otherwise without complaint today.    Atrial Fibrillation Risk Factors:  she does not have symptoms or diagnosis of sleep apnea. Negative sleep study. she does not have a history of rheumatic fever. she does not have a history of alcohol use. The patient does have a history of early familial atrial fibrillation or other arrhythmias. Mother had afib.  she has a BMI of Body mass index is 36.34 kg/m.Marland Kitchen Filed Weights   05/06/21 1115  Weight: 108.4 kg     Family History  Problem Relation Age of Onset   Hypertension Father    Breast  cancer Maternal Aunt      Atrial Fibrillation Management history:  Previous antiarrhythmic drugs: none Previous cardioversions: none Previous ablations: 04/06/21 CHADS2VASC score: 5 Anticoagulation history: Xarelto    Past Medical History:  Diagnosis Date   Diabetes mellitus without complication ([place])    Hypertension    Kidney stones    Morbid obesity ([place])    Paroxysmal atrial fibrillation ([facility])    PONV (postoperative nausea and vomiting)    Past Surgical History:  Procedure Laterality Date   ABDOMINAL HYSTERECTOMY  2003   ADENOIDECTOMY     age 21   ATRIAL FIBRILLATION ABLATION N/A 04/06/2021   Procedure: ATRIAL FIBRILLATION ABLATION;  Surgeon: Thompson Grayer, MD;  Location: [facility];  Service: Cardiovascular;  Laterality: N/A;   BREAST LUMPECTOMY     x 2 in her 20's   BREAST SURGERY     CESAREAN SECTION     one previous   ECTOPIC PREGNANCY SURGERY     HEMORRHOID SURGERY     in 20's   LEFT HEART CATH AND CORONARY ANGIOGRAPHY N/A 11/23/2020   Procedure: LEFT HEART CATH AND CORONARY ANGIOGRAPHY;  Surgeon: Belva Crome, MD;  Location: [facility];  Service: Cardiovascular;  Laterality: N/A;   TEE WITHOUT CARDIOVERSION N/A 04/06/2021   Procedure: TRANSESOPHAGEAL ECHOCARDIOGRAM (TEE);  Surgeon: Geralynn Rile, MD;  Location: [facility];  Service: Cardiovascular;  Laterality: N/A;   TONSILLECTOMY      Current Outpatient Medications  Medication Sig Dispense Refill   acyclovir (ZOVIRAX) 200 MG capsule Take 200 mg by mouth See admin instructions. Take 200 mg by  mouth in the morning & take 200 mg by mouth 3 times daily if outbreak presents     amitriptyline (ELAVIL) 10 MG tablet Take 10 mg by mouth at bedtime.     clonazePAM (KLONOPIN) 1 MG tablet Take 1 mg by mouth at bedtime.     diltiazem (CARDIZEM CD) 240 MG 24 hr capsule Take 1 capsule (240 mg total) by mouth daily. 90 capsule 3   diltiazem (CARDIZEM) 60 MG tablet Take 1 tablet (60 mg total) by mouth  4 (four) times daily as needed. As needed for break through At Fib 30 tablet prn   escitalopram (LEXAPRO) 10 MG tablet Take 10 mg by mouth at bedtime.     Evolocumab (REPATHA SURECLICK) [id number] MG/ML SOAJ Inject 1 pen into the skin every 14 (fourteen) days. 6 mL 3   fluticasone (FLONASE) 50 MCG/ACT nasal spray Place 1 spray into both nostrils daily.     levocetirizine (XYZAL) 5 MG tablet Take 5 mg by mouth every evening.     metFORMIN (GLUCOPHAGE-XR) 500 MG 24 hr tablet Take 500 mg by mouth at bedtime.     nitroGLYCERIN (NITROSTAT) 0.4 MG SL tablet PLACE 1 TABLET UNDER THE TONGUE EVERY 5 MINUTES X 3 DOSES AS NEEDED FOR CHEST PAIN. 25 tablet 2   rivaroxaban (XARELTO) 20 MG TABS tablet Take 1 tablet (20 mg total) by mouth daily with supper. 30 tablet 6   triamterene-hydrochlorothiazide (MAXZIDE-25) 37.5-25 MG per tablet Take 1 tablet by mouth daily.     estradiol (ESTRACE) 0.1 MG/GM vaginal cream Place 1 Applicatorful vaginally daily. (Patient not taking: Reported on 05/06/2021)     No current facility-administered medications for this encounter.    Allergies  Allergen Reactions   Biaxin [Clarithromycin]     Extreme stomach discomfort   Statins     Severe muscle aches with Lipitor (1998) and Crestor (2008)   Welchol [Colesevelam Hcl]     Side effects - muscle and GI    Social History   Socioeconomic History   Marital status: Married    Spouse name: Not on file   Number of children: Not on file   Years of education: Not on file   Highest education level: Not on file  Occupational History   Not on file  Tobacco Use   Smoking status: Never   Smokeless tobacco: Never  Substance and Sexual Activity   Alcohol use: No   Drug use: No   Sexual activity: Yes    Birth control/protection: Surgical  Other Topics Concern   Not on file  Social History Narrative   Not on file   Social Determinants of Health   Financial Resource Strain: Not on file  Food Insecurity: Not on file   Transportation Needs: Not on file  Physical Activity: Not on file  Stress: Not on file  Social Connections: Not on file  Intimate Partner Violence: Not on file     ROS- All systems are reviewed and negative except as per the HPI above.  Physical Exam: Vitals:   05/06/21 1115  BP: 124/74  Pulse: 98  Weight: 108.4 kg  Height: '5\' 8"'$  (1.727 m)    GEN- The patient is a well appearing obese female, alert and oriented x 3 today.   HEENT-head normocephalic, atraumatic, sclera clear, conjunctiva pink, hearing intact, trachea midline. Lungs- Clear to ausculation bilaterally, normal work of breathing Heart- Regular rate and rhythm, no murmurs, rubs or gallops  GI- soft, NT, ND, + BS Extremities- no clubbing,  cyanosis, or edema MS- no significant deformity or atrophy Skin- no rash or lesion Psych- euthymic mood, full affect Neuro- strength and sensation are intact   Wt Readings from Last 3 Encounters:  05/06/21 108.4 kg  04/06/21 108.9 kg  02/03/21 111.9 kg    EKG today demonstrates  SR, PVC Vent. rate 98 BPM PR interval 166 ms QRS duration 82 ms QT/QTcB 358/457 ms  Echo 11/18/20 demonstrated  1. Left ventricular ejection fraction, by estimation, is 55 to 60%. Left  ventricular ejection fraction by 3D volume is 59 %. The left ventricle has  normal function. The left ventricle demonstrates regional wall motion  abnormalities (see scoring  diagram/findings for description). Left ventricular diastolic parameters  were normal. There is mild hypokinesis of the left ventricular, basal-mid  inferolateral wall and inferior wall. The average left ventricular global  longitudinal strain is -20.0 %.  The global longitudinal strain is normal.   2. Right ventricular systolic function is normal. The right ventricular  size is normal. There is normal pulmonary artery systolic pressure.   3. The mitral valve is normal in structure. Trivial mitral valve  regurgitation. No evidence of  mitral stenosis.   4. The aortic valve is tricuspid. There is mild calcification of the  aortic valve. Aortic valve regurgitation is mild.   5. The inferior vena cava is normal in size with greater than 50%  respiratory variability, suggesting right atrial pressure of 3 mmHg.   Comparison(s): Changes from prior study are noted. 05/14/14 EF 55-60%.  Epic records are reviewed at length today  CHA2DS2-VASc Score = 5  The patient's score is based upon: CHF History: No HTN History: Yes Diabetes History: Yes Stroke History: No Vascular Disease History: Yes Age Score: 1 Gender Score: 1      ASSESSMENT AND PLAN: 1. Paroxysmal Atrial Fibrillation (ICD10:  I48.0) The patient's CHA2DS2-VASc score is 5, indicating a 7.2% annual risk of stroke.   S/p afib ablation 04/06/21 with Dr Rayann Heman Patient appears to be maintaining SR. Reassured that some breakthrough episodes of afib soon after ablation are common. Continue diltiazem 240 mg daily with 60 mg PRN for heart racing Continue Xarelto 20 mg daily  2. Secondary Hypercoagulable State (ICD10:  D68.69) The patient is at significant risk for stroke/thromboembolism based upon her CHA2DS2-VASc Score of 5.  Continue Rivaroxaban (Xarelto).   3. Obesity Body mass index is 36.34 kg/m. Lifestyle modification was discussed and encouraged including regular physical activity and weight reduction.  4. CAD No anginal symptoms.  5. HTN Stable, no changes today.   Follow up with Dr Rayann Heman as scheduled.    [facility] [address] [place], [place] [address] [phone] 05/06/2021 11:47 AM

## 2021-05-13 ENCOUNTER — Other Ambulatory Visit: Payer: Self-pay | Admitting: Internal Medicine

## 2021-05-25 ENCOUNTER — Other Ambulatory Visit: Payer: Self-pay | Admitting: Cardiology

## 2021-05-25 NOTE — Telephone Encounter (Signed)
Xarelto '20mg'$  refill request received. Pt is 70 years old, weight-108.4kg, Crea-0.89 on 7/5/222, last seen by Adline Peals, PA on 05/06/21, Diagnosis-Afib, CrCl-99.20m/min; Dose is appropriate based on dosing criteria. Will send in refill to requested pharmacy.

## 2021-06-01 DIAGNOSIS — Z1231 Encounter for screening mammogram for malignant neoplasm of breast: Secondary | ICD-10-CM | POA: Diagnosis not present

## 2021-06-08 DIAGNOSIS — R921 Mammographic calcification found on diagnostic imaging of breast: Secondary | ICD-10-CM | POA: Diagnosis not present

## 2021-06-08 DIAGNOSIS — R928 Other abnormal and inconclusive findings on diagnostic imaging of breast: Secondary | ICD-10-CM | POA: Diagnosis not present

## 2021-06-14 ENCOUNTER — Ambulatory Visit: Payer: Medicare Other | Admitting: Internal Medicine

## 2021-06-14 DIAGNOSIS — F419 Anxiety disorder, unspecified: Secondary | ICD-10-CM | POA: Diagnosis not present

## 2021-06-14 DIAGNOSIS — G4700 Insomnia, unspecified: Secondary | ICD-10-CM | POA: Diagnosis not present

## 2021-06-18 ENCOUNTER — Other Ambulatory Visit: Payer: Self-pay | Admitting: Radiology

## 2021-06-18 DIAGNOSIS — R921 Mammographic calcification found on diagnostic imaging of breast: Secondary | ICD-10-CM | POA: Diagnosis not present

## 2021-06-18 DIAGNOSIS — N6012 Diffuse cystic mastopathy of left breast: Secondary | ICD-10-CM | POA: Diagnosis not present

## 2021-07-12 ENCOUNTER — Other Ambulatory Visit: Payer: Self-pay

## 2021-07-12 ENCOUNTER — Ambulatory Visit: Payer: Medicare Other | Admitting: Internal Medicine

## 2021-07-12 VITALS — BP 128/62 | HR 92 | Ht 68.0 in | Wt 240.0 lb

## 2021-07-12 DIAGNOSIS — I10 Essential (primary) hypertension: Secondary | ICD-10-CM

## 2021-07-12 DIAGNOSIS — R0683 Snoring: Secondary | ICD-10-CM | POA: Diagnosis not present

## 2021-07-12 DIAGNOSIS — I480 Paroxysmal atrial fibrillation: Secondary | ICD-10-CM

## 2021-07-12 NOTE — Patient Instructions (Addendum)
Medication Instructions:  Your physician recommends that you continue on your current medications as directed. Please refer to the Current Medication list given to you today. *If you need a refill on your cardiac medications before your next appointment, please call your pharmacy*  Lab Work: None. If you have labs (blood work) drawn today and your tests are completely normal, you will receive your results only by: [place] (if you have MyChart) OR A paper copy in the mail If you have any lab test that is abnormal or we need to change your treatment, we will call you to review the results.  Testing/Procedures: None.  Follow-Up: At [facility], you and your health needs are our priority.  As part of our continuing mission to provide you with exceptional heart care, we have created designated Provider Care Teams.  These Care Teams include your primary Cardiologist (physician) and Advanced Practice Providers (APPs -  Physician Assistants and Nurse Practitioners) who all work together to provide you with the care you need, when you need it.  Your physician wants you to follow-up in: 3 months with the [facility] Clinic. They will contact you to schedule. 6 months with Dr. Marlou Porch.   We recommend signing up for the patient portal called "MyChart".  Sign up information is provided on this After Visit Summary.  MyChart is used to connect with patients for Virtual Visits (Telemedicine).  Patients are able to view lab/test results, encounter notes, upcoming appointments, etc.  Non-urgent messages can be sent to your provider as well.   To learn more about what you can do with MyChart, go to [email or web address].    Any Other Special Instructions Will Be Listed Below (If Applicable).

## 2021-07-12 NOTE — Progress Notes (Signed)
PCP: Marieelena Ada, MD Primary Cardiologist: Dr Shelly Rubenstein is a 70 y.o. female who presents today for routine electrophysiology followup.  Since his recent afib ablation, the patient reports doing very well.  she denies procedure related complications and is pleased with the results of the procedure.  Today, she denies symptoms of palpitations, chest pain, shortness of breath,  lower extremity edema, dizziness, presyncope, or syncope.  The patient is otherwise without complaint today.   Past Medical History:  Diagnosis Date   Diabetes mellitus without complication ([place])    Hypertension    Kidney stones    Morbid obesity ([place])    Paroxysmal atrial fibrillation ([facility])    PONV (postoperative nausea and vomiting)    Past Surgical History:  Procedure Laterality Date   ABDOMINAL HYSTERECTOMY  2003   ADENOIDECTOMY     age 48   ATRIAL FIBRILLATION ABLATION N/A 04/06/2021   Procedure: ATRIAL FIBRILLATION ABLATION;  Surgeon: Thompson Grayer, MD;  Location: [facility];  Service: Cardiovascular;  Laterality: N/A;   BREAST LUMPECTOMY     x 2 in her 20's   BREAST SURGERY     CESAREAN SECTION     one previous   ECTOPIC PREGNANCY SURGERY     HEMORRHOID SURGERY     in 20's   LEFT HEART CATH AND CORONARY ANGIOGRAPHY N/A 11/23/2020   Procedure: LEFT HEART CATH AND CORONARY ANGIOGRAPHY;  Surgeon: Belva Crome, MD;  Location: [facility];  Service: Cardiovascular;  Laterality: N/A;   TEE WITHOUT CARDIOVERSION N/A 04/06/2021   Procedure: TRANSESOPHAGEAL ECHOCARDIOGRAM (TEE);  Surgeon: Geralynn Rile, MD;  Location: [place];  Service: Cardiovascular;  Laterality: N/A;   TONSILLECTOMY      ROS- all systems are personally reviewed and negatives except as per HPI above  Current Outpatient Medications  Medication Sig Dispense Refill   acyclovir (ZOVIRAX) 200 MG capsule Take 200 mg by mouth See admin instructions. Take 200 mg by mouth in the morning & take 200 mg  by mouth 3 times daily if outbreak presents     amitriptyline (ELAVIL) 10 MG tablet Take 10 mg by mouth at bedtime.     clonazePAM (KLONOPIN) 1 MG tablet Take 1 mg by mouth at bedtime.     diltiazem (CARDIZEM CD) 240 MG 24 hr capsule Take 1 capsule (240 mg total) by mouth daily. 90 capsule 3   diltiazem (CARDIZEM) 60 MG tablet Take 1 tablet (60 mg total) by mouth 4 (four) times daily as needed. As needed for break through At Fib 30 tablet prn   escitalopram (LEXAPRO) 10 MG tablet Take 10 mg by mouth at bedtime.     estradiol (ESTRACE) 0.1 MG/GM vaginal cream Place 1 Applicatorful vaginally daily. (Patient not taking: Reported on 05/06/2021)     Evolocumab (REPATHA SURECLICK) 382 MG/ML SOAJ Inject 1 pen into the skin every 14 (fourteen) days. 6 mL 3   fluticasone (FLONASE) 50 MCG/ACT nasal spray Place 1 spray into both nostrils daily.     levocetirizine (XYZAL) 5 MG tablet Take 5 mg by mouth every evening.     metFORMIN (GLUCOPHAGE-XR) 500 MG 24 hr tablet Take 500 mg by mouth at bedtime.     nitroGLYCERIN (NITROSTAT) 0.4 MG SL tablet PLACE 1 TABLET UNDER THE TONGUE EVERY 5 MINUTES X 3 DOSES AS NEEDED FOR CHEST PAIN. 25 tablet 2   rivaroxaban (XARELTO) 20 MG TABS tablet TAKE 1 TABLET BY MOUTH DAILY WITH SUPPER. 30 tablet  6   triamterene-hydrochlorothiazide (MAXZIDE-25) 37.5-25 MG per tablet Take 1 tablet by mouth daily.     No current facility-administered medications for this visit.    Physical Exam: Vitals:   07/12/21 1143  BP: 128/62  Pulse: 92  SpO2: 96%  Weight: 240 lb (108.9 kg)  Height: 5\' 8"  (1.727 m)    GEN- The patient is well appearing, alert and oriented x 3 today.   Head- normocephalic, atraumatic Eyes-  Sclera clear, conjunctiva pink Ears- hearing intact Oropharynx- clear Lungs- Clear to ausculation bilaterally, normal work of breathing Heart- Regular rate and rhythm, no murmurs, rubs or gallops, PMI not laterally displaced GI- soft, NT, ND, + BS Extremities- no  clubbing, cyanosis, or edema  EKG tracing ordered today is personally reviewed and shows sinus  Assessment and Plan:  1. Paroxysmal atrial fibrillation Doing well s/p ablation chads2vasc score is 5.  Continue xarelto  2. HTN We discussed stopping diltiazem. She does not wish to stop this currently due to BP.  3. CAD Cath 2022 revealed total OM2 disease with collateral flow, 80-90% focal mid PDA disease.  No obstructive LAD disease Medical management advised by Dr Tamala Julian  4. Snoring Follows with Dr Maxwell Caul She states that she does not have sleep apnea   Return to see AF clinc in 3 months Follow-up with Dr Marlou Porch in 6 months  Thompson Grayer MD, [facility] 07/12/2021 11:59 AM

## 2021-07-26 DIAGNOSIS — E119 Type 2 diabetes mellitus without complications: Secondary | ICD-10-CM | POA: Diagnosis not present

## 2021-07-28 DIAGNOSIS — F419 Anxiety disorder, unspecified: Secondary | ICD-10-CM | POA: Diagnosis not present

## 2021-07-28 DIAGNOSIS — G4700 Insomnia, unspecified: Secondary | ICD-10-CM | POA: Diagnosis not present

## 2021-10-07 ENCOUNTER — Other Ambulatory Visit: Payer: Self-pay | Admitting: Cardiology

## 2021-10-11 DIAGNOSIS — Z7984 Long term (current) use of oral hypoglycemic drugs: Secondary | ICD-10-CM | POA: Diagnosis not present

## 2021-10-11 DIAGNOSIS — E1169 Type 2 diabetes mellitus with other specified complication: Secondary | ICD-10-CM | POA: Diagnosis not present

## 2021-10-11 DIAGNOSIS — E7800 Pure hypercholesterolemia, unspecified: Secondary | ICD-10-CM | POA: Diagnosis not present

## 2021-10-11 DIAGNOSIS — F419 Anxiety disorder, unspecified: Secondary | ICD-10-CM | POA: Diagnosis not present

## 2021-10-11 DIAGNOSIS — G4700 Insomnia, unspecified: Secondary | ICD-10-CM | POA: Diagnosis not present

## 2021-10-19 ENCOUNTER — Ambulatory Visit (HOSPITAL_COMMUNITY)
Admission: RE | Admit: 2021-10-19 | Discharge: 2021-10-19 | Disposition: A | Discharge status: Home / Self Care | Payer: Medicare Other | Source: Ambulatory Visit | Attending: Nurse Practitioner | Admitting: Nurse Practitioner

## 2021-10-19 ENCOUNTER — Encounter (HOSPITAL_COMMUNITY): Payer: Self-pay

## 2021-10-19 ENCOUNTER — Other Ambulatory Visit: Payer: Self-pay

## 2021-10-19 ENCOUNTER — Encounter (HOSPITAL_COMMUNITY): Payer: Self-pay | Admitting: Nurse Practitioner

## 2021-10-19 VITALS — BP 146/68 | HR 92 | Ht 68.0 in | Wt 235.0 lb

## 2021-10-19 DIAGNOSIS — Z6835 Body mass index (BMI) 35.0-35.9, adult: Secondary | ICD-10-CM | POA: Insufficient documentation

## 2021-10-19 DIAGNOSIS — E119 Type 2 diabetes mellitus without complications: Secondary | ICD-10-CM | POA: Diagnosis not present

## 2021-10-19 DIAGNOSIS — D6869 Other thrombophilia: Secondary | ICD-10-CM | POA: Insufficient documentation

## 2021-10-19 DIAGNOSIS — I480 Paroxysmal atrial fibrillation: Secondary | ICD-10-CM | POA: Insufficient documentation

## 2021-10-19 DIAGNOSIS — I10 Essential (primary) hypertension: Secondary | ICD-10-CM | POA: Diagnosis not present

## 2021-10-19 DIAGNOSIS — E669 Obesity, unspecified: Secondary | ICD-10-CM | POA: Diagnosis not present

## 2021-10-19 DIAGNOSIS — E785 Hyperlipidemia, unspecified: Secondary | ICD-10-CM | POA: Insufficient documentation

## 2021-10-19 DIAGNOSIS — I2510 Atherosclerotic heart disease of native coronary artery without angina pectoris: Secondary | ICD-10-CM | POA: Insufficient documentation

## 2021-10-19 DIAGNOSIS — Z79899 Other long term (current) drug therapy: Secondary | ICD-10-CM | POA: Insufficient documentation

## 2021-10-19 MED ORDER — DILTIAZEM HCL ER COATED BEADS 180 MG PO CP24: 180.0000 mg | ORAL_CAPSULE | Freq: Every day | ORAL | 11 refills | Status: DC

## 2021-10-19 NOTE — Progress Notes (Signed)
Primary Care Physician: Rebeka Ada, MD Primary Cardiologist: Dr Marlou Porch Primary Electrophysiologist: Dr Rayann Heman  Referring Physician: Dr Guss Bunde is a 72 y.o. female with a history of HLD, HTN, DM, CAD, and atrial fibrillation who presents for follow up in the [facility]. The patient was initially diagnosed with atrial fibrillation 10/29/20 after presenting with symptoms of tachypalpitations and chest pain. Patient is on Xarelto for a CHADS2VASC score of 5. She had a LHC which showed total occlusion of the mid to distal OM1 beyond bifurcation, diffuse 50% mid LAD, Proximal to mid first diagonal segmental 50% narrowing. Dominant RCA with 50% segmental mid narrowing and mid PDA 85% stenosis. Aggressive medical management was recommended. She denies any significant alcohol use.  On follow up today, patient is s/p afib ablation with Dr Rayann Heman on 04/06/21. She reports that she has done very well since the procedure with more energy. She did have a few brief episodes of heart racing but these have resolved. She denies CP, swallowing pain, or groin issues.   F/u in afib clinic, 10/19/21. She is f/u ablation from 04/06/21. She has not noted any afib. She is asking re daily cardizem as she has needed daily laxative since being on this. We can reduce dose and see how pt responds to this. She has been on Noom since December and has lost 9 lbs.    Today, she denies symptoms of chest pain, shortness of breath, orthopnea, PND, lower extremity edema, dizziness, presyncope, syncope, snoring, daytime somnolence, bleeding, or neurologic sequela. The patient is tolerating medications without difficulties and is otherwise without complaint today.    Atrial Fibrillation Risk Factors:  she does not have symptoms or diagnosis of sleep apnea. Negative sleep study. she does not have a history of rheumatic fever. she does not have a history of alcohol use. The patient does  have a history of early familial atrial fibrillation or other arrhythmias. Mother had afib.  she has a BMI of Body mass index is 35.73 kg/m.Marland Kitchen Filed Weights   10/19/21 1117  Weight: 106.6 kg     Family History  Problem Relation Age of Onset   Hypertension Father    Breast cancer Maternal Aunt      Atrial Fibrillation Management history:  Previous antiarrhythmic drugs: none Previous cardioversions: none Previous ablations: 04/06/21 CHADS2VASC score: 5 Anticoagulation history: Xarelto    Past Medical History:  Diagnosis Date   Diabetes mellitus without complication ([place])    Hypertension    Kidney stones    Morbid obesity ([place])    Paroxysmal atrial fibrillation ([facility])    PONV (postoperative nausea and vomiting)    Past Surgical History:  Procedure Laterality Date   ABDOMINAL HYSTERECTOMY  2003   ADENOIDECTOMY     age 53   ATRIAL FIBRILLATION ABLATION N/A 04/06/2021   Procedure: ATRIAL FIBRILLATION ABLATION;  Surgeon: Thompson Grayer, MD;  Location: [facility];  Service: Cardiovascular;  Laterality: N/A;   BREAST LUMPECTOMY     x 2 in her 20's   BREAST SURGERY     CESAREAN SECTION     one previous   ECTOPIC PREGNANCY SURGERY     HEMORRHOID SURGERY     in 20's   LEFT HEART CATH AND CORONARY ANGIOGRAPHY N/A 11/23/2020   Procedure: LEFT HEART CATH AND CORONARY ANGIOGRAPHY;  Surgeon: Belva Crome, MD;  Location: [facility];  Service: Cardiovascular;  Laterality: N/A;   TEE WITHOUT CARDIOVERSION  N/A 04/06/2021   Procedure: TRANSESOPHAGEAL ECHOCARDIOGRAM (TEE);  Surgeon: Geralynn Rile, MD;  Location: [facility];  Service: Cardiovascular;  Laterality: N/A;   TONSILLECTOMY      Current Outpatient Medications  Medication Sig Dispense Refill   acyclovir (ZOVIRAX) 200 MG capsule Take 200 mg by mouth See admin instructions. Take 200 mg by mouth in the morning & take 200 mg by mouth 3 times daily if outbreak presents     amitriptyline (ELAVIL) 10 MG tablet  Take 5 mg by mouth at bedtime.     clonazePAM (KLONOPIN) 1 MG tablet Take 1 mg by mouth at bedtime.     diltiazem (CARDIZEM CD) 180 MG 24 hr capsule Take 1 capsule (180 mg total) by mouth daily. 30 capsule 11   diltiazem (CARDIZEM) 60 MG tablet Take 1 tablet (60 mg total) by mouth 4 (four) times daily as needed. As needed for break through At Fib 30 tablet prn   escitalopram (LEXAPRO) 10 MG tablet Take 10 mg by mouth at bedtime.     Evolocumab (REPATHA SURECLICK) 756 MG/ML SOAJ Inject 1 pen into the skin every 14 (fourteen) days. 6 mL 3   fluticasone (FLONASE) 50 MCG/ACT nasal spray Place 2 sprays into both nostrils daily.     levocetirizine (XYZAL) 5 MG tablet Take 5 mg by mouth every evening.     metFORMIN (GLUCOPHAGE-XR) 500 MG 24 hr tablet Take 500 mg by mouth at bedtime.     nitroGLYCERIN (NITROSTAT) 0.4 MG SL tablet PLACE 1 TABLET UNDER THE TONGUE EVERY 5 MINUTES X 3 DOSES AS NEEDED FOR CHEST PAIN. 25 tablet 2   rivaroxaban (XARELTO) 20 MG TABS tablet TAKE 1 TABLET BY MOUTH DAILY WITH SUPPER. 30 tablet 6   triamterene-hydrochlorothiazide (MAXZIDE-25) 37.5-25 MG per tablet Take 1 tablet by mouth daily.     estradiol (ESTRACE) 0.1 MG/GM vaginal cream Place 1 Applicatorful vaginally daily. (Patient not taking: Reported on 10/19/2021)     No current facility-administered medications for this encounter.    Allergies  Allergen Reactions   Biaxin [Clarithromycin]     Extreme stomach discomfort   Other Other (See Comments)   Statins     Severe muscle aches with Lipitor (1998) and Crestor (2008)   Welchol [Colesevelam Hcl]     Side effects - muscle and GI   Welchol [Colesevelam] Other (See Comments)    Social History   Socioeconomic History   Marital status: Married    Spouse name: Not on file   Number of children: Not on file   Years of education: Not on file   Highest education level: Not on file  Occupational History   Not on file  Tobacco Use   Smoking status: Never    Smokeless tobacco: Never  Substance and Sexual Activity   Alcohol use: No   Drug use: No   Sexual activity: Yes    Birth control/protection: Surgical  Other Topics Concern   Not on file  Social History Narrative   Not on file   Social Determinants of Health   Financial Resource Strain: Not on file  Food Insecurity: Not on file  Transportation Needs: Not on file  Physical Activity: Not on file  Stress: Not on file  Social Connections: Not on file  Intimate Partner Violence: Not on file     ROS- All systems are reviewed and negative except as per the HPI above.  Physical Exam: Vitals:   10/19/21 1117  BP: (!) 146/68  Pulse: 92  Weight: 106.6 kg  Height: 5\' 8"  (1.727 m)    GEN- The patient is a well appearing obese female, alert and oriented x 3 today.   HEENT-head normocephalic, atraumatic, sclera clear, conjunctiva pink, hearing intact, trachea midline. Lungs- Clear to ausculation bilaterally, normal work of breathing Heart- Regular rate and rhythm, no murmurs, rubs or gallops  GI- soft, NT, ND, + BS Extremities- no clubbing, cyanosis, or edema MS- no significant deformity or atrophy Skin- no rash or lesion Psych- euthymic mood, full affect Neuro- strength and sensation are intact   Wt Readings from Last 3 Encounters:  10/19/21 106.6 kg  07/12/21 108.9 kg  05/06/21 108.4 kg    EKG today demonstrates  SR, PVC Vent. rate 98 BPM PR interval 166 ms QRS duration 82 ms QT/QTcB 358/457 ms  Echo 11/18/20 demonstrated  1. Left ventricular ejection fraction, by estimation, is 55 to 60%. Left  ventricular ejection fraction by 3D volume is 59 %. The left ventricle has  normal function. The left ventricle demonstrates regional wall motion  abnormalities (see scoring  diagram/findings for description). Left ventricular diastolic parameters  were normal. There is mild hypokinesis of the left ventricular, basal-mid  inferolateral wall and inferior wall. The average  left ventricular global  longitudinal strain is -20.0 %.  The global longitudinal strain is normal.   2. Right ventricular systolic function is normal. The right ventricular  size is normal. There is normal pulmonary artery systolic pressure.   3. The mitral valve is normal in structure. Trivial mitral valve  regurgitation. No evidence of mitral stenosis.   4. The aortic valve is tricuspid. There is mild calcification of the  aortic valve. Aortic valve regurgitation is mild.   5. The inferior vena cava is normal in size with greater than 50%  respiratory variability, suggesting right atrial pressure of 3 mmHg.   Comparison(s): Changes from prior study are noted. 05/14/14 EF 55-60%.  Epic records are reviewed at length today  CHA2DS2-VASc Score = 5  The patient's score is based upon: CHF History: 0 HTN History: 1 Diabetes History: 1 Stroke History: 0 Vascular Disease History: 1 Age Score: 1 Gender Score: 1       ASSESSMENT AND PLAN: 1. Paroxysmal Atrial Fibrillation (ICD10:  I48.0) The patient's CHA2DS2-VASc score is 5, indicating a 7.2% annual risk of stroke.   S/p afib ablation 04/06/21 with Dr Rayann Heman Patient appears to be maintaining SR.   She is reporting constipation with CCB. Will reduce  diltiazem  to 180  mg daily. She has 60 mg PRN for heart racing If she does well, will continue to try to wean off She had been on atenolol in the past  Continue Xarelto 20 mg daily  2. Secondary Hypercoagulable State (ICD10:  D68.69) The patient is at significant risk for stroke/thromboembolism based upon her CHA2DS2-VASc Score of 5.  Continue Rivaroxaban (Xarelto).   3. Obesity Has lost 9 lbs with Noom. Congratulated on weight loss Body mass index is 35.73 kg/m.  4. CAD No anginal symptoms.  5. HTN Stable, no changes today. She will watch BP at home with reduction of diltiazem.   Follow up with Dr Marlou Porch in April, [facility] as needed

## 2021-11-01 DIAGNOSIS — I788 Other diseases of capillaries: Secondary | ICD-10-CM | POA: Diagnosis not present

## 2021-11-01 DIAGNOSIS — L820 Inflamed seborrheic keratosis: Secondary | ICD-10-CM | POA: Diagnosis not present

## 2021-11-01 DIAGNOSIS — Z85828 Personal history of other malignant neoplasm of skin: Secondary | ICD-10-CM | POA: Diagnosis not present

## 2021-11-01 DIAGNOSIS — D1722 Benign lipomatous neoplasm of skin and subcutaneous tissue of left arm: Secondary | ICD-10-CM | POA: Diagnosis not present

## 2021-11-07 ENCOUNTER — Other Ambulatory Visit: Payer: Self-pay | Admitting: Cardiology

## 2021-12-14 DIAGNOSIS — I10 Essential (primary) hypertension: Secondary | ICD-10-CM | POA: Diagnosis not present

## 2021-12-14 DIAGNOSIS — E7800 Pure hypercholesterolemia, unspecified: Secondary | ICD-10-CM | POA: Diagnosis not present

## 2021-12-14 DIAGNOSIS — I480 Paroxysmal atrial fibrillation: Secondary | ICD-10-CM | POA: Diagnosis not present

## 2021-12-14 DIAGNOSIS — E1169 Type 2 diabetes mellitus with other specified complication: Secondary | ICD-10-CM | POA: Diagnosis not present

## 2021-12-22 ENCOUNTER — Other Ambulatory Visit: Payer: Self-pay | Admitting: Cardiology

## 2021-12-22 DIAGNOSIS — I480 Paroxysmal atrial fibrillation: Secondary | ICD-10-CM

## 2021-12-22 NOTE — Telephone Encounter (Signed)
Prescription refill request for Xarelto received.  ?Indication: Afib  ?Last office visit: 10/19/21 Kayleen Memos)  ?Weight: 106.6kg ?Age: 71 ?Scr: 0.89 (04/06/21) ?CrCl: 97.11m/min ? ?Appropriate dose and refill sent to requested pharmacy.  ?

## 2021-12-29 DIAGNOSIS — M170 Bilateral primary osteoarthritis of knee: Secondary | ICD-10-CM | POA: Diagnosis not present

## 2022-01-24 DIAGNOSIS — G4721 Circadian rhythm sleep disorder, delayed sleep phase type: Secondary | ICD-10-CM | POA: Diagnosis not present

## 2022-03-14 ENCOUNTER — Other Ambulatory Visit: Payer: Self-pay

## 2022-03-14 ENCOUNTER — Emergency Department (HOSPITAL_BASED_OUTPATIENT_CLINIC_OR_DEPARTMENT_OTHER)
Admission: EM | Admit: 2022-03-14 | Discharge: 2022-03-14 | Disposition: A | Discharge status: Home / Self Care | Payer: Medicare Other | Attending: Emergency Medicine | Admitting: Emergency Medicine

## 2022-03-14 ENCOUNTER — Encounter (HOSPITAL_BASED_OUTPATIENT_CLINIC_OR_DEPARTMENT_OTHER): Payer: Self-pay

## 2022-03-14 ENCOUNTER — Emergency Department (HOSPITAL_BASED_OUTPATIENT_CLINIC_OR_DEPARTMENT_OTHER): Payer: Medicare Other | Admitting: Radiology

## 2022-03-14 DIAGNOSIS — R051 Acute cough: Secondary | ICD-10-CM | POA: Diagnosis not present

## 2022-03-14 DIAGNOSIS — Z7901 Long term (current) use of anticoagulants: Secondary | ICD-10-CM | POA: Diagnosis not present

## 2022-03-14 DIAGNOSIS — Z20822 Contact with and (suspected) exposure to covid-19: Secondary | ICD-10-CM | POA: Insufficient documentation

## 2022-03-14 DIAGNOSIS — Z7984 Long term (current) use of oral hypoglycemic drugs: Secondary | ICD-10-CM | POA: Diagnosis not present

## 2022-03-14 DIAGNOSIS — R059 Cough, unspecified: Secondary | ICD-10-CM | POA: Diagnosis not present

## 2022-03-14 DIAGNOSIS — B9789 Other viral agents as the cause of diseases classified elsewhere: Secondary | ICD-10-CM | POA: Diagnosis not present

## 2022-03-14 DIAGNOSIS — J9811 Atelectasis: Secondary | ICD-10-CM | POA: Diagnosis not present

## 2022-03-14 DIAGNOSIS — J069 Acute upper respiratory infection, unspecified: Secondary | ICD-10-CM | POA: Insufficient documentation

## 2022-03-14 LAB — SARS CORONAVIRUS 2 BY RT PCR: SARS Coronavirus 2 by RT PCR: NEGATIVE

## 2022-03-14 MED ORDER — IPRATROPIUM-ALBUTEROL 0.5-2.5 (3) MG/3ML IN SOLN
3.0000 mL | Freq: Once | RESPIRATORY_TRACT | Status: AC
  Administered 2022-03-14: 3 mL via RESPIRATORY_TRACT
  Filled 2022-03-14: qty 3

## 2022-03-14 MED ORDER — ALBUTEROL SULFATE HFA 108 (90 BASE) MCG/ACT IN AERS
2.0000 | INHALATION_SPRAY | Freq: Once | RESPIRATORY_TRACT | Status: AC
  Administered 2022-03-14: 2 via RESPIRATORY_TRACT
  Filled 2022-03-14: qty 6.7

## 2022-03-14 MED ORDER — AEROCHAMBER PLUS FLO-VU MEDIUM MISC
1.0000 | Freq: Once | Status: AC
  Administered 2022-03-14: 1
  Filled 2022-03-14: qty 1

## 2022-03-14 NOTE — ED Triage Notes (Signed)
"  Coughing x 8 days, headache and sinus congestion" per pt

## 2022-03-14 NOTE — Discharge Instructions (Signed)
Your chest x-ray was negative for pneumonia.  You are likely developing bronchitis from your viral upper respiratory infection.  I have given you an albuterol inhaler to use as needed.  Continue taking over-the-counter medications such as Robitussin Tylenol Motrin for your symptoms.  I hope you feel better soon.

## 2022-03-14 NOTE — ED Provider Notes (Signed)
[facility] EMERGENCY DEPT Provider Note   CSN: [id number] Arrival date & time: 03/14/22  1608     History  Chief Complaint  Patient presents with   Cough    Lindsey Jordan is a 71 y.o. female who presents to the emergency department for evaluation of cough x8 days.  Patient states that she got sick from her 60-year-old grandson and initially had congestion cold and flu symptoms.  Most of her symptoms have since resolved, however her cough seems to worsen.  She states that she feels the need to cough something up but cough is nonproductive.  She has some residual nasal congestion and a headache along with pain in her back and chest secondary to coughing.  She has been taking over-the-counter Mucinex, Robitussin, DayQuil, NyQuil without improvement.  She denies fevers, chills, abdominal pain, nausea, vomiting and diarrhea.  Cough      Home Medications Prior to Admission medications   Medication Sig Start Date End Date Taking? Authorizing Provider  acyclovir (ZOVIRAX) 200 MG capsule Take 200 mg by mouth See admin instructions. Take 200 mg by mouth in the morning & take 200 mg by mouth 3 times daily if outbreak presents    [provider]  amitriptyline (ELAVIL) 10 MG tablet Take 5 mg by mouth at bedtime. 04/22/21   [provider]  clonazePAM (KLONOPIN) 1 MG tablet Take 1 mg by mouth at bedtime.    [provider]  diltiazem (CARDIZEM CD) 180 MG 24 hr capsule Take 1 capsule (180 mg total) by mouth daily. 10/19/21 10/19/22  Sherran Needs, NP  diltiazem (CARDIZEM) 60 MG tablet Take 1 tablet (60 mg total) by mouth 4 (four) times daily as needed. As needed for break through At Fib 10/30/20   Jerline Pain, MD  escitalopram (LEXAPRO) 10 MG tablet Take 10 mg by mouth at bedtime.    [provider]  estradiol (ESTRACE) 0.1 MG/GM vaginal cream Place 1 Applicatorful vaginally daily. Patient not taking: Reported on 10/19/2021 03/24/21   [provider]  Evolocumab (REPATHA SURECLICK) 127 MG/ML SOAJ INJECT 1 PEN INTO THE SKIN EVERY 14 (FOURTEEN) DAYS. 11/08/21   Jerline Pain, MD  fluticasone (FLONASE) 50 MCG/ACT nasal spray Place 2 sprays into both nostrils daily.    [provider]  levocetirizine (XYZAL) 5 MG tablet Take 5 mg by mouth every evening.    [provider]  metFORMIN (GLUCOPHAGE-XR) 500 MG 24 hr tablet Take 500 mg by mouth at bedtime.    [provider]  nitroGLYCERIN (NITROSTAT) 0.4 MG SL tablet PLACE 1 TABLET UNDER THE TONGUE EVERY 5 MINUTES X 3 DOSES AS NEEDED FOR CHEST PAIN. 01/06/21   Allred, Jeneen Rinks, MD  rivaroxaban (XARELTO) 20 MG TABS tablet TAKE 1 TABLET BY MOUTH DAILY WITH SUPPER 12/22/21   Jerline Pain, MD  triamterene-hydrochlorothiazide (MAXZIDE-25) 37.5-25 MG per tablet Take 1 tablet by mouth daily.    [provider]      Allergies    Biaxin [clarithromycin], Other, Statins, Welchol [colesevelam hcl], and Welchol [colesevelam]    Review of Systems   Review of Systems  Respiratory:  Positive for cough.     Physical Exam Updated Vital Signs BP (!) 187/80   Pulse 96   Temp 98.2 F (36.8 C) (Oral)   Resp 18   Ht '5\' 8"'$  (1.727 m)   Wt 106.6 kg   LMP  (LMP Unknown)   SpO2 100%   BMI 35.73 kg/m  Physical Exam Vitals and nursing note reviewed.  Constitutional:      General: She is not in acute distress.    Appearance: She is not ill-appearing.  HENT:     Head: Atraumatic.     Comments: Pharynx is clear without erythema. Eyes:     Conjunctiva/sclera: Conjunctivae normal.  Cardiovascular:     Rate and Rhythm: Normal rate and regular rhythm.     Pulses: Normal pulses.     Heart sounds: No murmur heard. Pulmonary:     Effort: Pulmonary effort is normal. No respiratory distress.     Breath sounds: Wheezing present.     Comments: Mild wheezing in the right lower lobe.  No accessory muscle usage no increased respiratory effort Abdominal:     General:  Abdomen is flat. There is no distension.     Palpations: Abdomen is soft.     Tenderness: There is no abdominal tenderness.  Musculoskeletal:        General: Normal range of motion.     Cervical back: Normal range of motion.  Skin:    General: Skin is warm and dry.     Capillary Refill: Capillary refill takes less than 2 seconds.  Neurological:     General: No focal deficit present.     Mental Status: She is alert.  Psychiatric:        Mood and Affect: Mood normal.     ED Results / Procedures / Treatments   Labs (all labs ordered are listed, but only abnormal results are displayed) Labs Reviewed  SARS CORONAVIRUS 2 BY RT PCR    EKG None  Radiology DG Chest 2 View  Result Date: 03/14/2022 CLINICAL DATA:  cough EXAM: CHEST - 2 VIEW COMPARISON:  May 10, 2021 FINDINGS: Heart and mediastinal contours are stable without cardiomegaly. Thoracic aorta is ectatic. There is mild atelectasis seen at the right lung base and is new. No focal consolidation, pleural effusion or significant vascular congestion. The visualized skeletal structures are unremarkable. IMPRESSION: Mild atelectasis at the right lung base and is new. No consolidation, pleural effusion or vascular congestion. Electronically Signed   By: Frazier Richards M.D.   On: 03/14/2022 17:29    Procedures Procedures    Medications Ordered in ED Medications  ipratropium-albuterol (DUONEB) 0.5-2.5 (3) MG/3ML nebulizer solution 3 mL (3 mLs Nebulization Given 03/14/22 1747)  albuterol (VENTOLIN HFA) 108 (90 Base) MCG/ACT inhaler 2 puff (2 puffs Inhalation Provided for home use 03/14/22 1747)  AeroChamber Plus Flo-Vu Medium MISC 1 each (1 each Other Given 03/14/22 1748)    ED Course/ Medical Decision Making/ A&P                           Medical Decision Making Amount and/or Complexity of Data Reviewed Radiology: ordered.  Risk Prescription drug management.   Social determinants of health:  Social History   Socioeconomic  History   Marital status: Married    Spouse name: Not on file   Number of children: Not on file   Years of education: Not on file   Highest education level: Not on file  Occupational History   Not on file  Tobacco Use   Smoking status: Never   Smokeless tobacco: Never  Substance and Sexual Activity   Alcohol use: No   Drug use: No   Sexual activity: Yes    Birth control/protection: Surgical  Other Topics Concern   Not on file  Social History  Narrative   Not on file   Social Determinants of Health   Financial Resource Strain: Not on file  Food Insecurity: Not on file  Transportation Needs: Not on file  Physical Activity: Not on file  Stress: Not on file  Social Connections: Not on file  Intimate Partner Violence: Not on file     Initial impression:  This patient presents to the ED for concern of coughing and headache x8 days, this involves an extensive number of treatment options, and is a complaint that carries with it a high risk of complications and morbidity.   Differentials include viral URI, COVID, pneumonia, bronchitis, PE.   Comorbidities affecting care:  A-fib  Additional history obtained: Husband  Lab Tests  I Ordered, reviewed, and interpreted labs and EKG.  The pertinent results include:  COVID-negative  Imaging Studies ordered:  I ordered imaging studies including  Chest x-ray with right lower lobe atelectasis but no consolidation or signs of infection I independently visualized and interpreted imaging and I agree with the radiologist interpretation.    Medicines ordered and prescription drug management:  I ordered medication including: DuoNeb Albuterol inhaler with spacer Reevaluation of the patient after these medicines showed that the patient improved I have reviewed the patients home medicines and have made adjustments as needed  ED Course/Re-evaluation: 71 year old female presents to the emergency department for evaluation of coughing  and headache x8 days.  Vitals without significant abnormalities.  On exam, she is slightly ill-appearing with a dry hacking cough noted during evaluation.  She does have mild wheezing in the right lower lobe.  Patient was given DuoNeb treatment.  X-ray shows atelectasis of the right lower lobe without consolidation or infection.  On reevaluation, patient was feeling much better and her chest did not feel as tight.  Plan to discharge home with albuterol inhaler.  I offered Ladona Ridgel although patient states that this normally works for her and would prefer to stick with Robitussin and over-the-counter medications.  Plan to follow-up with PCP as needed.  Patient expresses understanding and is amenable to plan.    Disposition:  After consideration of the diagnostic results, physical exam, history and the patients response to treatment feel that the patent would benefit from discharge.   Viral URI with cough: Plan and management as described above. Discharged home in good condition.  Final Clinical Impression(s) / ED Diagnoses Final diagnoses:  Viral URI with cough    Rx / DC Orders ED Discharge Orders     None         Rodena Piety 03/14/22 2017    Isla Pence, MD 03/14/22 2329

## 2022-03-14 NOTE — ED Notes (Signed)
Patient instructed on the MDI with spacer. Demonstrated good effort and understanding.

## 2022-04-18 DIAGNOSIS — G4721 Circadian rhythm sleep disorder, delayed sleep phase type: Secondary | ICD-10-CM | POA: Diagnosis not present

## 2022-04-18 DIAGNOSIS — G4700 Insomnia, unspecified: Secondary | ICD-10-CM | POA: Diagnosis not present

## 2022-04-21 DIAGNOSIS — E1169 Type 2 diabetes mellitus with other specified complication: Secondary | ICD-10-CM | POA: Diagnosis not present

## 2022-04-21 DIAGNOSIS — Z0000 Encounter for general adult medical examination without abnormal findings: Secondary | ICD-10-CM | POA: Diagnosis not present

## 2022-04-21 DIAGNOSIS — D6859 Other primary thrombophilia: Secondary | ICD-10-CM | POA: Diagnosis not present

## 2022-04-21 DIAGNOSIS — F419 Anxiety disorder, unspecified: Secondary | ICD-10-CM | POA: Diagnosis not present

## 2022-04-21 DIAGNOSIS — E7800 Pure hypercholesterolemia, unspecified: Secondary | ICD-10-CM | POA: Diagnosis not present

## 2022-06-14 DIAGNOSIS — Z1231 Encounter for screening mammogram for malignant neoplasm of breast: Secondary | ICD-10-CM | POA: Diagnosis not present

## 2022-06-18 ENCOUNTER — Other Ambulatory Visit: Payer: Self-pay | Admitting: Cardiology

## 2022-06-18 DIAGNOSIS — I480 Paroxysmal atrial fibrillation: Secondary | ICD-10-CM

## 2022-06-20 NOTE — Telephone Encounter (Signed)
Prescription refill request for Xarelto received.  Indication:Afib Last office visit:10/22 Weight:106.6 kg Age:71 Scr:0.6 CrCl:142.63 ml/min  Prescription refilled

## 2022-06-23 ENCOUNTER — Other Ambulatory Visit: Payer: Self-pay | Admitting: Cardiology

## 2022-07-13 ENCOUNTER — Telehealth: Payer: Self-pay

## 2022-07-13 NOTE — Patient Outreach (Signed)
  Care Coordination   07/13/2022 Name: [name] MRN: [id number] DOB: 11/01/1950   Care Coordination Outreach Attempts:  An unsuccessful telephone outreach was attempted today to offer the patient information about available care coordination services as a benefit of their health plan.   Follow Up Plan:  Additional outreach attempts will be made to offer the patient care coordination information and services.   Encounter Outcome:  No Answer  Care Coordination Interventions Activated:  No   Care Coordination Interventions:  No, not indicated    Jone Baseman, RN, MSN [facility] Care Management Care Management Coordinator Direct Line [phone]

## 2022-07-28 ENCOUNTER — Telehealth: Payer: Self-pay

## 2022-07-28 NOTE — Patient Outreach (Signed)
  Care Coordination   07/28/2022 Name: Lindsey Jordan MRN: [id number] DOB: 1950/11/24   Care Coordination Outreach Attempts:  A second unsuccessful outreach was attempted today to offer the patient with information about available care coordination services as a benefit of their health plan.     Follow Up Plan:  Additional outreach attempts will be made to offer the patient care coordination information and services.   Encounter Outcome:  No Answer  Care Coordination Interventions Activated:  No   Care Coordination Interventions:  No, not indicated    Jone Baseman, RN, MSN [facility] Care Management Care Management Coordinator Direct Line [phone]

## 2022-08-02 DIAGNOSIS — H26492 Other secondary cataract, left eye: Secondary | ICD-10-CM | POA: Diagnosis not present

## 2022-08-02 DIAGNOSIS — E119 Type 2 diabetes mellitus without complications: Secondary | ICD-10-CM | POA: Diagnosis not present

## 2022-08-11 ENCOUNTER — Telehealth: Payer: Self-pay

## 2022-08-11 NOTE — Patient Outreach (Signed)
  Care Coordination   08/11/2022 Name: Lindsey Jordan MRN: [id number] DOB: March 30, 1951   Care Coordination Outreach Attempts:  A third unsuccessful outreach was attempted today to offer the patient with information about available care coordination services as a benefit of their health plan.   Follow Up Plan:  No further outreach attempts will be made at this time. We have been unable to contact the patient to offer or enroll patient in care coordination services  Encounter Outcome:  No Answer  Care Coordination Interventions Activated:  No   Care Coordination Interventions:  No, not indicated    Jone Baseman, RN, MSN [facility] Care Management Coordinator Direct Line [phone]

## 2022-09-20 IMAGING — DX DG CHEST 2V
2 series · 2 of 2 positions shown · non-contrast
Comparison: May 10, 2021

CLINICAL DATA: cough

EXAM:
CHEST - 2 VIEW

[chest pa]
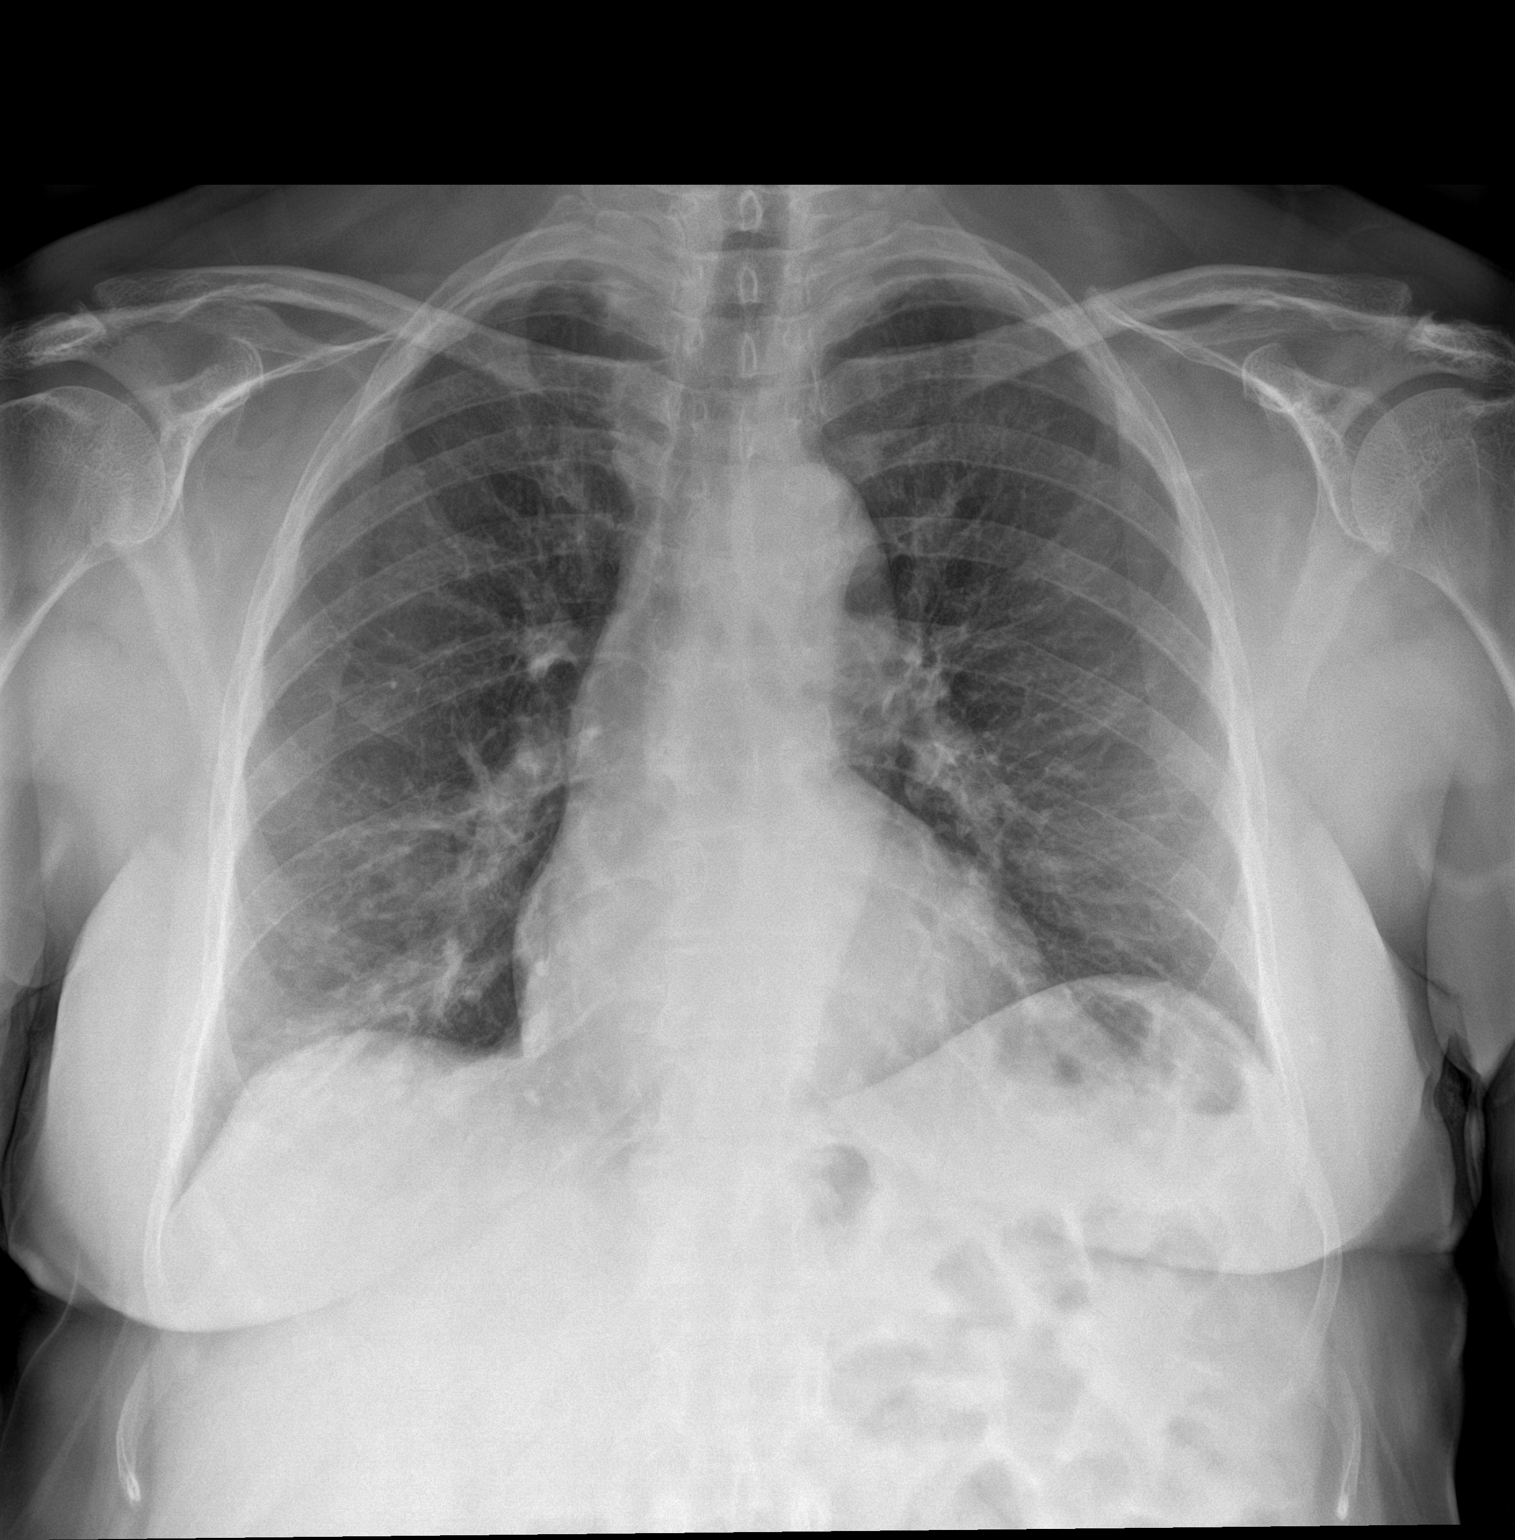

[chest lat]
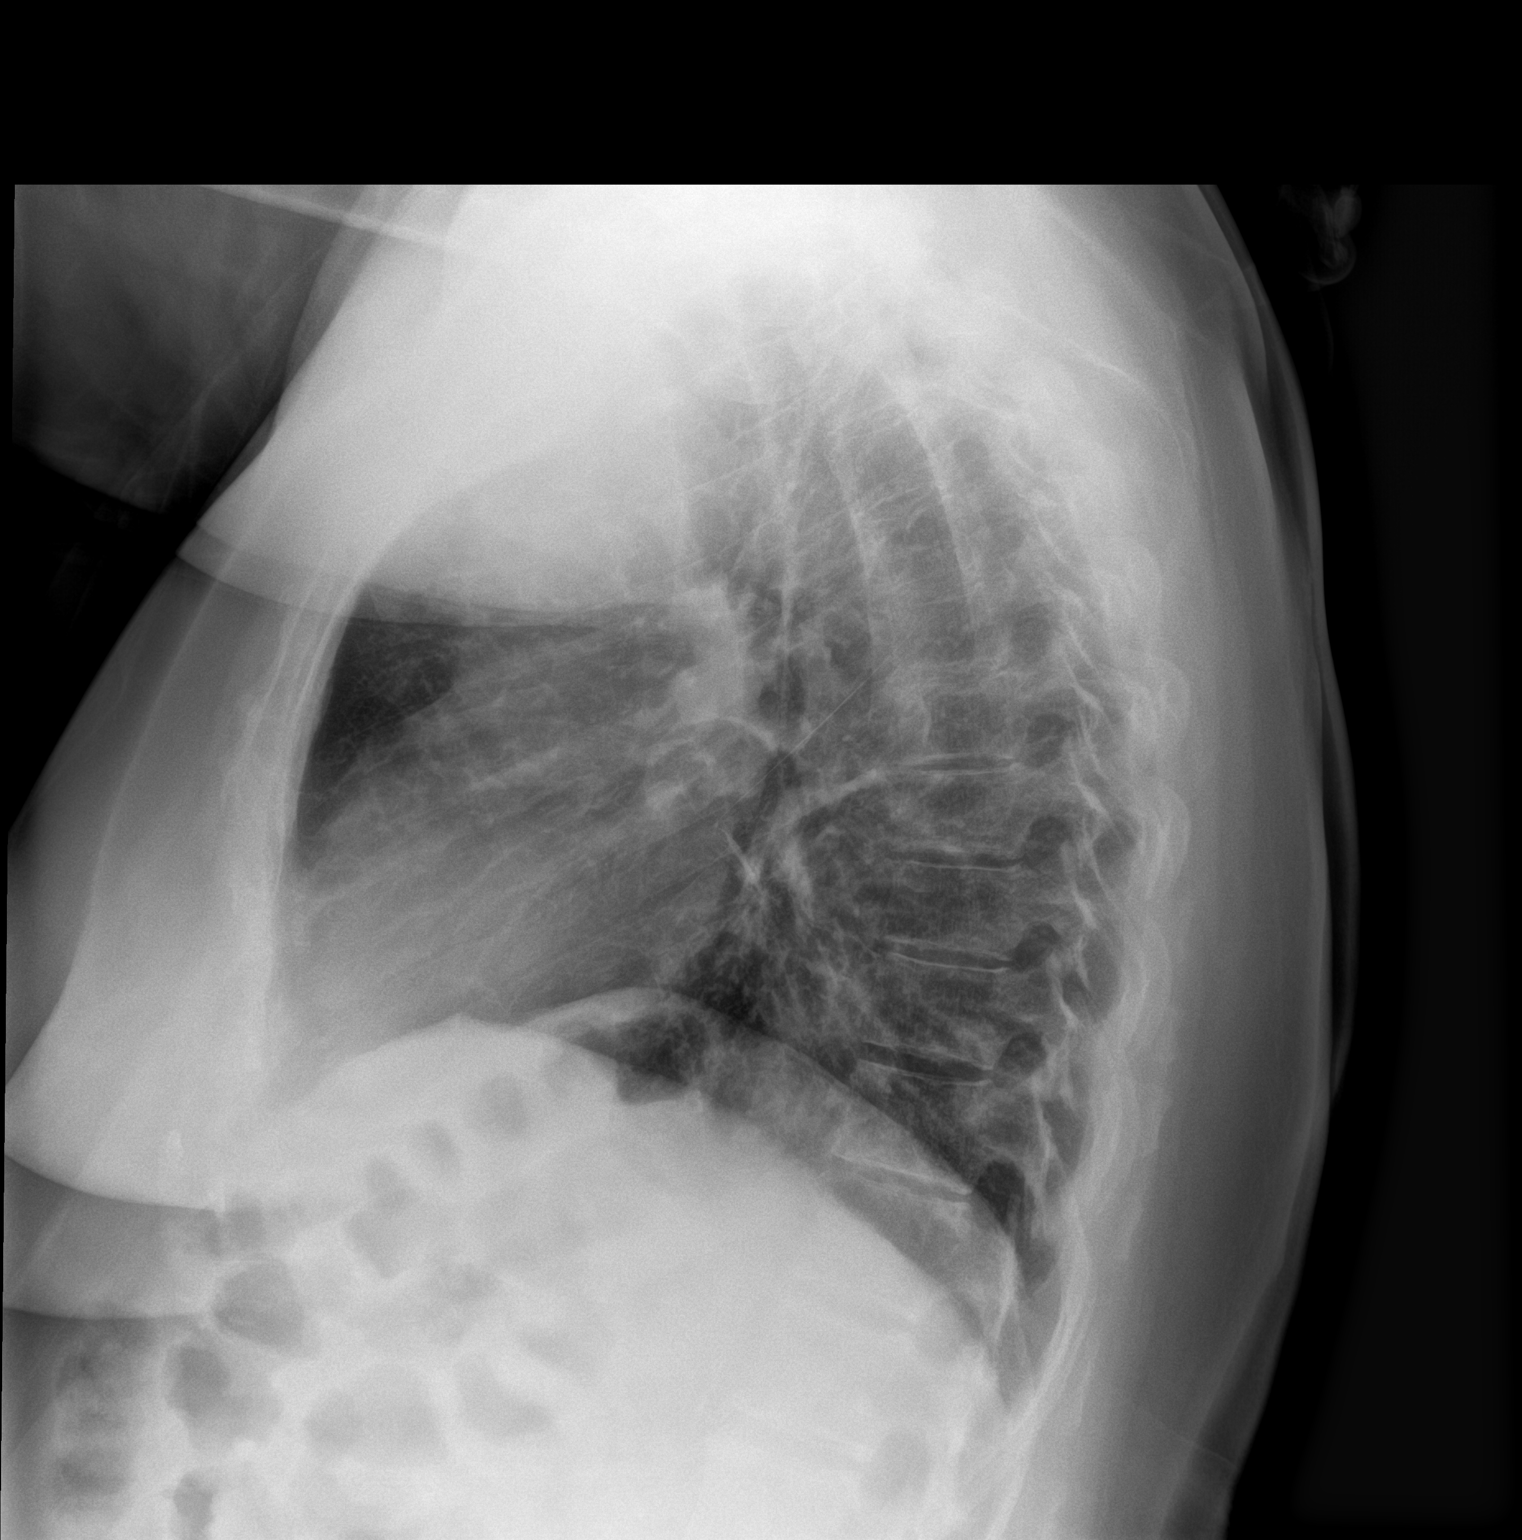

[2 of 2 positions shown; findings below may reference images not displayed]

FINDINGS: Heart and mediastinal contours are stable without cardiomegaly.
Thoracic aorta is ectatic. There is mild atelectasis seen at the
right lung base and is new. No focal consolidation, pleural effusion
or significant vascular congestion. The visualized skeletal
structures are unremarkable.
IMPRESSION: Mild atelectasis at the right lung base and is new. No
consolidation, pleural effusion or vascular congestion.

## 2022-11-01 DIAGNOSIS — B078 Other viral warts: Secondary | ICD-10-CM | POA: Diagnosis not present

## 2022-11-01 DIAGNOSIS — L814 Other melanin hyperpigmentation: Secondary | ICD-10-CM | POA: Diagnosis not present

## 2022-11-01 DIAGNOSIS — C44311 Basal cell carcinoma of skin of nose: Secondary | ICD-10-CM | POA: Diagnosis not present

## 2022-11-01 DIAGNOSIS — D1722 Benign lipomatous neoplasm of skin and subcutaneous tissue of left arm: Secondary | ICD-10-CM | POA: Diagnosis not present

## 2022-11-01 DIAGNOSIS — D1721 Benign lipomatous neoplasm of skin and subcutaneous tissue of right arm: Secondary | ICD-10-CM | POA: Diagnosis not present

## 2022-11-01 DIAGNOSIS — L820 Inflamed seborrheic keratosis: Secondary | ICD-10-CM | POA: Diagnosis not present

## 2022-11-01 DIAGNOSIS — Z85828 Personal history of other malignant neoplasm of skin: Secondary | ICD-10-CM | POA: Diagnosis not present

## 2022-11-01 DIAGNOSIS — B079 Viral wart, unspecified: Secondary | ICD-10-CM | POA: Diagnosis not present

## 2022-11-03 ENCOUNTER — Other Ambulatory Visit: Payer: Self-pay | Admitting: Family Medicine

## 2022-11-03 ENCOUNTER — Ambulatory Visit
Admission: RE | Admit: 2022-11-03 | Discharge: 2022-11-03 | Disposition: A | Discharge status: Home / Self Care | Payer: Medicare Other | Source: Ambulatory Visit | Attending: Family Medicine | Admitting: Family Medicine

## 2022-11-03 DIAGNOSIS — E1165 Type 2 diabetes mellitus with hyperglycemia: Secondary | ICD-10-CM | POA: Diagnosis not present

## 2022-11-03 DIAGNOSIS — E7800 Pure hypercholesterolemia, unspecified: Secondary | ICD-10-CM | POA: Diagnosis not present

## 2022-11-03 DIAGNOSIS — R053 Chronic cough: Secondary | ICD-10-CM

## 2022-11-03 DIAGNOSIS — D6859 Other primary thrombophilia: Secondary | ICD-10-CM | POA: Diagnosis not present

## 2022-11-03 DIAGNOSIS — I700 Atherosclerosis of aorta: Secondary | ICD-10-CM | POA: Diagnosis not present

## 2022-11-03 DIAGNOSIS — R059 Cough, unspecified: Secondary | ICD-10-CM | POA: Diagnosis not present

## 2022-11-07 DIAGNOSIS — C44311 Basal cell carcinoma of skin of nose: Secondary | ICD-10-CM | POA: Diagnosis not present

## 2022-11-08 ENCOUNTER — Telehealth: Payer: Self-pay

## 2022-11-08 ENCOUNTER — Other Ambulatory Visit (HOSPITAL_COMMUNITY): Payer: Self-pay

## 2022-11-08 NOTE — Telephone Encounter (Signed)
Pharmacy Patient Advocate Encounter  Prior Authorization for REPATHA 140 MG/ML INJ has been approved.    Effective dates: 11/08/22 through 11/09/23   Received notification from [place] that prior authorization for REPATHA 140 MG/ML INJ is needed.    PA submitted on 11/08/22 Key [id number] Status is pending  Karie Soda, [place] Patient Advocate Specialist Direct Number: [phone] Fax: [phone]

## 2022-11-10 ENCOUNTER — Encounter (HOSPITAL_COMMUNITY): Payer: Self-pay | Admitting: *Deleted

## 2022-11-28 ENCOUNTER — Encounter: Payer: Self-pay | Admitting: Cardiology

## 2022-11-30 ENCOUNTER — Ambulatory Visit: Payer: Medicare Other | Attending: Interventional Cardiology | Admitting: Interventional Cardiology

## 2022-11-30 ENCOUNTER — Encounter: Payer: Self-pay | Admitting: Interventional Cardiology

## 2022-11-30 VITALS — BP 126/64 | HR 103 | Ht 67.0 in | Wt 235.4 lb

## 2022-11-30 DIAGNOSIS — I10 Essential (primary) hypertension: Secondary | ICD-10-CM | POA: Diagnosis not present

## 2022-11-30 DIAGNOSIS — I25118 Atherosclerotic heart disease of native coronary artery with other forms of angina pectoris: Secondary | ICD-10-CM

## 2022-11-30 DIAGNOSIS — R42 Dizziness and giddiness: Secondary | ICD-10-CM

## 2022-11-30 DIAGNOSIS — I480 Paroxysmal atrial fibrillation: Secondary | ICD-10-CM | POA: Diagnosis not present

## 2022-11-30 NOTE — Patient Instructions (Signed)
Medication Instructions:  Your physician has recommended you make the following change in your medication: Stop Maxzide  *If you need a refill on your cardiac medications before your next appointment, please call your pharmacy*   Lab Work: Lab work to be done today If you have labs (blood work) drawn today and your tests are completely normal, you will receive your results only by: [place] (if you have MyChart) OR A paper copy in the mail If you have any lab test that is abnormal or we need to change your treatment, we will call you to review the results.   Testing/Procedures: Your physician has requested that you have an echocardiogram. Echocardiography is a painless test that uses sound waves to create images of your heart. It provides your doctor with information about the size and shape of your heart and how well your heart's chambers and valves are working. This procedure takes approximately one hour. There are no restrictions for this procedure. Please do NOT wear cologne, perfume, aftershave, or lotions (deodorant is allowed). Please arrive 15 minutes prior to your appointment time.    Follow-Up: At [facility], you and your health needs are our priority.  As part of our continuing mission to provide you with exceptional heart care, we have created designated Provider Care Teams.  These Care Teams include your primary Cardiologist (physician) and Advanced Practice Providers (APPs -  Physician Assistants and Nurse Practitioners) who all work together to provide you with the care you need, when you need it.  We recommend signing up for the patient portal called "MyChart".  Sign up information is provided on this After Visit Summary.  MyChart is used to connect with patients for Virtual Visits (Telemedicine).  Patients are able to view lab/test results, encounter notes, upcoming appointments, etc.  Non-urgent messages can be sent to your provider as well.   To learn  more about what you can do with MyChart, go to [email or web address].    Your next appointment:   3-4 weeks   Provider:   Candee Furbish, MD or APP    Other Instructions  Make sure you drink plenty of water to stay well hydrated.  Decrease caffeine intake

## 2022-11-30 NOTE — Progress Notes (Signed)
Cardiology Office Note   Date:  11/30/2022   ID:  Lindsey Jordan, DOB [birth year], MRN [id number]  PCP:  Shley Ada, MD    No chief complaint on file.  Atrial fibrillation  Wt Readings from Last 3 Encounters:  11/30/22 235 lb 6.4 oz (106.8 kg)  03/14/22 235 lb (106.6 kg)  10/19/21 235 lb (106.6 kg)       History of Present Illness: Lindsey Jordan is a 72 y.o. female with history of atrial fibrillation.  Had ablation several years ago.  She called the office a few days ago reporting: "upon rising from a seated or horizontal position, my heart rate has risen quickly from normal to 122 bpm. I feel as if I will fall or black out, and I feel weak. I feel the need to be seen by you as soon as possible. I've heard of POTS, but worry that if this is POTS, there may be a med check for meds that may be adjusted, or a nutritional checkup, or more. Thank you!"  "I have used a pulse oximeter tool on my index finger. It takes about 29-30 minutes to go to baseline. When it happens, I sometimes feel like I'm going to faint (I've never fainted in my life). When it happens, I try to find a wall to lean on, a chair to sit in, or a sofa or bed. "  "I take Xarelto. I have no new meds since I saw you all last. It does not feel like Afib at all. "  Cath in 11/2020 showed: "1st Diag lesion is 40% stenosed. Ost LAD to Mid LAD lesion is 40% stenosed.   Total occlusion of the distal third of the second obtuse marginal.  The marginal has left to left and right to left collaterals.  Small first obtuse marginal with diffuse disease.  The proximal to mid circumflex contains minimal luminal irregularities. Mild to moderate proximal to mid RCA diffuse disease.  Mid PDA 80 to 90% focal stenosis.  RCA is a dominant vessel with 2 other left ventricular branches.  Fills the distal obtuse marginal by collaterals. Left main is widely patent Segmental proximal to mid 40 to 50% LAD.  Large first diagonal  contains proximal to mid diffuse 30% narrowing. Normal LV function with EF 60%.  LVEDP is normal."  HR has been high with minimal activity.  Even turning over in bed causes the heart rate to go up.  Past Medical History:  Diagnosis Date   Diabetes mellitus without complication ([place])    Hypertension    Kidney stones    Morbid obesity ([place])    Paroxysmal atrial fibrillation ([facility])    PONV (postoperative nausea and vomiting)     Past Surgical History:  Procedure Laterality Date   ABDOMINAL HYSTERECTOMY  2003   ADENOIDECTOMY     age 21   ATRIAL FIBRILLATION ABLATION N/A 04/06/2021   Procedure: ATRIAL FIBRILLATION ABLATION;  Surgeon: Thompson Grayer, MD;  Location: [facility];  Service: Cardiovascular;  Laterality: N/A;   BREAST LUMPECTOMY     x 2 in her 20's   BREAST SURGERY     CESAREAN SECTION     one previous   ECTOPIC PREGNANCY SURGERY     HEMORRHOID SURGERY     in 20's   LEFT HEART CATH AND CORONARY ANGIOGRAPHY N/A 11/23/2020   Procedure: LEFT HEART CATH AND CORONARY ANGIOGRAPHY;  Surgeon: Belva Crome, MD;  Location: [facility];  Service: Cardiovascular;  Laterality: N/A;   TEE WITHOUT CARDIOVERSION N/A 04/06/2021   Procedure: TRANSESOPHAGEAL ECHOCARDIOGRAM (TEE);  Surgeon: Geralynn Rile, MD;  Location: [facility];  Service: Cardiovascular;  Laterality: N/A;   TONSILLECTOMY       Current Outpatient Medications  Medication Sig Dispense Refill   acyclovir (ZOVIRAX) 200 MG capsule Take 200 mg by mouth See admin instructions. Take 200 mg by mouth in the morning & take 200 mg by mouth 3 times daily if outbreak presents     amitriptyline (ELAVIL) 10 MG tablet Take 5 mg by mouth at bedtime.     clonazePAM (KLONOPIN) 1 MG tablet Take 1 mg by mouth at bedtime.     diltiazem (CARDIZEM CD) 240 MG 24 hr capsule Take 240 mg by mouth daily.     diltiazem (TIAZAC) 240 MG 24 hr capsule Take 240 mg by mouth daily.     escitalopram (LEXAPRO) 10 MG tablet Take 10 mg by  mouth at bedtime.     estradiol (ESTRACE) 0.1 MG/GM vaginal cream Place 1 Applicatorful vaginally daily.     Evolocumab (REPATHA SURECLICK) [id number] MG/ML SOAJ INJECT 1 PEN INTO THE SKIN EVERY 14 (FOURTEEN) DAYS. 6 mL 3   fluticasone (FLONASE) 50 MCG/ACT nasal spray Place 2 sprays into both nostrils daily.     metFORMIN (GLUCOPHAGE-XR) 500 MG 24 hr tablet Take 500 mg by mouth at bedtime.     nitroGLYCERIN (NITROSTAT) 0.4 MG SL tablet PLACE 1 TABLET UNDER THE TONGUE EVERY 5 MINUTES X 3 DOSES AS NEEDED FOR CHEST PAIN. 25 tablet 2   XARELTO 20 MG TABS tablet TAKE 1 TABLET BY MOUTH DAILY WITH SUPPER 30 tablet 5   diltiazem (CARDIZEM CD) 180 MG 24 hr capsule Take 1 capsule (180 mg total) by mouth daily. 30 capsule 11   diltiazem (CARDIZEM) 60 MG tablet Take 1 tablet (60 mg total) by mouth 4 (four) times daily as needed. As needed for break through At Fib (Patient not taking: Reported on 11/30/2022) 30 tablet prn   doxycycline (VIBRAMYCIN) 100 MG capsule Take 100 mg by mouth 2 (two) times daily. (Patient not taking: Reported on 11/30/2022)     levocetirizine (XYZAL) 5 MG tablet Take 5 mg by mouth every evening. (Patient not taking: Reported on 11/30/2022)     No current facility-administered medications for this visit.    Allergies:   Clarithromycin, Other, Statins, Welchol [colesevelam hcl], and Welchol [colesevelam]    Social History:  The patient  reports that she has never smoked. She has never used smokeless tobacco. She reports that she does not drink alcohol and does not use drugs.   Family History:  The patient's family history includes Breast cancer in her maternal aunt; Hypertension in her father.    ROS:  Please see the history of present illness.   Otherwise, review of systems are positive for lightheadedness.   All other systems are reviewed and negative.    PHYSICAL EXAM: VS:  BP 126/64   Pulse (!) 103   Ht '5\' 7"'$  (1.702 m)   Wt 235 lb 6.4 oz (106.8 kg)   LMP  (LMP Unknown)   SpO2 97%    BMI 36.87 kg/m  , BMI Body mass index is 36.87 kg/m. GEN: Well nourished, well developed, in no acute distress HEENT: normal Neck: no JVD, carotid bruits, or masses Cardiac: RRR; no murmurs, rubs, or gallops,no edema  Respiratory:  clear to auscultation bilaterally, normal work of breathing GI: soft, nontender, nondistended, +  BS MS: no deformity or atrophy Skin: warm and dry, no rash Neuro:  Slow gait Psych: euthymic mood, full affect  Orthostatics checked in the office today with increase in heart rate from the high 90s to 126.  Blood pressure was stable in the [id number] systolic with changes in position.    EKG:   The ekg ordered today demonstrates sinus tach, no ST changes   Recent Labs: No results found for requested labs within last 365 days.   Lipid Panel    Component Value Date/Time   CHOL 161 02/01/2021 1031   TRIG 231 (H) 02/01/2021 1031   HDL 48 02/01/2021 1031   CHOLHDL 3.4 02/01/2021 1031   CHOLHDL 3.7 09/14/2015 0852   VLDL 32 (H) 09/14/2015 0852   LDLCALC 75 02/01/2021 1031   LDLDIRECT 86.0 10/15/2014 0743     Other studies Reviewed: Additional studies/ records that were reviewed today with results demonstrating: labs results reviewed.  Normal creatinine and potassium in February 2024.  Normal TSH on November 03, 2022.  CBC had not been checked.   ASSESSMENT AND PLAN:  PAF: Appears to be In NSR / sinus tachycardia today. HR has been high at home.  She does not make any particular effort to stay well-hydrated at home.  She drinks 1 cup of coffee a day and perhaps another caffeinated beverage later in the day.  Stop Maxide to see if that helps the lightheadedness.  I encouraged her to stay well-hydrated.  Decrease caffeine intake.  Check echo.  Given that symptoms started after her last blood check, will check CBC and c-Met. Acquired thrombophilia: On Xarelto.   Obesity: healthy diet.  CAD No angina.  Small branch vessel disease.  Continue medical  therapy. Hypertension: The current medical regimen is effective;  continue present plan and medications.   Current medicines are reviewed at length with the patient today.  The patient concerns regarding her medicines were addressed.  The following changes have been made: As above  Labs/ tests ordered today include: As above  Orders Placed This Encounter  Procedures   Comp Met (CMET)   CBC   EKG 12-Lead   ECHOCARDIOGRAM COMPLETE    Recommend 150 minutes/week of aerobic exercise Low fat, low carb, high fiber diet recommended  Disposition:   FU in with Dr. Marlou Porch in 3 to 4 weeks   Signed, Larae Grooms, MD  11/30/2022 11:49 AM    [address], [place], [place]  [address] Phone: [phone]; Fax: [phone]

## 2022-12-01 ENCOUNTER — Other Ambulatory Visit: Payer: Self-pay | Admitting: *Deleted

## 2022-12-01 ENCOUNTER — Other Ambulatory Visit: Payer: Self-pay

## 2022-12-01 ENCOUNTER — Emergency Department (HOSPITAL_BASED_OUTPATIENT_CLINIC_OR_DEPARTMENT_OTHER): Payer: Medicare Other

## 2022-12-01 ENCOUNTER — Encounter (HOSPITAL_BASED_OUTPATIENT_CLINIC_OR_DEPARTMENT_OTHER): Payer: Self-pay

## 2022-12-01 ENCOUNTER — Inpatient Hospital Stay (HOSPITAL_BASED_OUTPATIENT_CLINIC_OR_DEPARTMENT_OTHER)
Admission: EM | Admit: 2022-12-01 | Discharge: 2022-12-04 | DRG: 377 | Disposition: A | Discharge status: Home / Self Care | Payer: Medicare Other | Attending: Internal Medicine | Admitting: Internal Medicine

## 2022-12-01 DIAGNOSIS — D649 Anemia, unspecified: Secondary | ICD-10-CM | POA: Diagnosis not present

## 2022-12-01 DIAGNOSIS — E119 Type 2 diabetes mellitus without complications: Secondary | ICD-10-CM | POA: Diagnosis present

## 2022-12-01 DIAGNOSIS — R42 Dizziness and giddiness: Secondary | ICD-10-CM

## 2022-12-01 DIAGNOSIS — K5909 Other constipation: Secondary | ICD-10-CM | POA: Diagnosis present

## 2022-12-01 DIAGNOSIS — Z90710 Acquired absence of both cervix and uterus: Secondary | ICD-10-CM

## 2022-12-01 DIAGNOSIS — I2510 Atherosclerotic heart disease of native coronary artery without angina pectoris: Secondary | ICD-10-CM | POA: Diagnosis present

## 2022-12-01 DIAGNOSIS — I700 Atherosclerosis of aorta: Secondary | ICD-10-CM | POA: Diagnosis not present

## 2022-12-01 DIAGNOSIS — Z7984 Long term (current) use of oral hypoglycemic drugs: Secondary | ICD-10-CM

## 2022-12-01 DIAGNOSIS — U071 COVID-19: Secondary | ICD-10-CM | POA: Diagnosis not present

## 2022-12-01 DIAGNOSIS — K922 Gastrointestinal hemorrhage, unspecified: Secondary | ICD-10-CM | POA: Diagnosis not present

## 2022-12-01 DIAGNOSIS — Z803 Family history of malignant neoplasm of breast: Secondary | ICD-10-CM

## 2022-12-01 DIAGNOSIS — F32A Depression, unspecified: Secondary | ICD-10-CM | POA: Diagnosis not present

## 2022-12-01 DIAGNOSIS — Z87442 Personal history of urinary calculi: Secondary | ICD-10-CM

## 2022-12-01 DIAGNOSIS — K921 Melena: Secondary | ICD-10-CM | POA: Diagnosis not present

## 2022-12-01 DIAGNOSIS — D62 Acute posthemorrhagic anemia: Secondary | ICD-10-CM | POA: Diagnosis present

## 2022-12-01 DIAGNOSIS — E876 Hypokalemia: Secondary | ICD-10-CM

## 2022-12-01 DIAGNOSIS — K5521 Angiodysplasia of colon with hemorrhage: Secondary | ICD-10-CM | POA: Diagnosis not present

## 2022-12-01 DIAGNOSIS — K760 Fatty (change of) liver, not elsewhere classified: Secondary | ICD-10-CM | POA: Diagnosis present

## 2022-12-01 DIAGNOSIS — K2970 Gastritis, unspecified, without bleeding: Secondary | ICD-10-CM | POA: Diagnosis not present

## 2022-12-01 DIAGNOSIS — R109 Unspecified abdominal pain: Secondary | ICD-10-CM | POA: Diagnosis not present

## 2022-12-01 DIAGNOSIS — Z8249 Family history of ischemic heart disease and other diseases of the circulatory system: Secondary | ICD-10-CM | POA: Diagnosis not present

## 2022-12-01 DIAGNOSIS — Z7901 Long term (current) use of anticoagulants: Secondary | ICD-10-CM | POA: Diagnosis not present

## 2022-12-01 DIAGNOSIS — R1310 Dysphagia, unspecified: Secondary | ICD-10-CM | POA: Diagnosis not present

## 2022-12-01 DIAGNOSIS — I10 Essential (primary) hypertension: Secondary | ICD-10-CM | POA: Diagnosis present

## 2022-12-01 DIAGNOSIS — R000 Tachycardia, unspecified: Secondary | ICD-10-CM | POA: Diagnosis not present

## 2022-12-01 DIAGNOSIS — I119 Hypertensive heart disease without heart failure: Secondary | ICD-10-CM | POA: Diagnosis present

## 2022-12-01 DIAGNOSIS — I480 Paroxysmal atrial fibrillation: Secondary | ICD-10-CM

## 2022-12-01 DIAGNOSIS — F419 Anxiety disorder, unspecified: Secondary | ICD-10-CM | POA: Diagnosis present

## 2022-12-01 LAB — COMPREHENSIVE METABOLIC PANEL
ALT: 29 IU/L (ref 0–32)
ALT: 22 U/L (ref 0–44)
AST: 27 IU/L (ref 0–40)
AST: 19 U/L (ref 15–41)
Albumin/Globulin Ratio: 2.2 (ref 1.2–2.2)
Albumin: 4.4 g/dL (ref 3.8–4.8)
Albumin: 4.1 g/dL (ref 3.5–5.0)
Alkaline Phosphatase: 101 IU/L (ref 44–121)
Alkaline Phosphatase: 71 U/L (ref 38–126)
Anion gap: 12 (ref 5–15)
BUN/Creatinine Ratio: 16 (ref 12–28)
BUN: 14 mg/dL (ref 8–27)
BUN: 17 mg/dL (ref 8–23)
Bilirubin Total: 0.3 mg/dL (ref 0.0–1.2)
CO2: 24 mmol/L (ref 20–29)
CO2: 26 mmol/L (ref 22–32)
Calcium: 9.4 mg/dL (ref 8.7–10.3)
Calcium: 9.2 mg/dL (ref 8.9–10.3)
Chloride: 98 mmol/L (ref 96–106)
Chloride: 99 mmol/L (ref 98–111)
Creatinine, Ser: 0.87 mg/dL (ref 0.57–1.00)
Creatinine, Ser: 0.68 mg/dL (ref 0.44–1.00)
GFR, Estimated: >60 mL/min (ref >60)
Globulin, Total: 2 g/dL (ref 1.5–4.5)
Glucose, Bld: 107 mg/dL — ABNORMAL HIGH (ref 70–99)
Glucose: 139 mg/dL — ABNORMAL HIGH (ref 70–99)
Potassium: 4.3 mmol/L (ref 3.5–5.2)
Potassium: 3.2 mmol/L — ABNORMAL LOW (ref 3.5–5.1)
Sodium: 140 mmol/L (ref 134–144)
Sodium: 137 mmol/L (ref 135–145)
Total Bilirubin: 0.3 mg/dL (ref 0.3–1.2)
Total Protein: 6.4 g/dL (ref 6.0–8.5)
Total Protein: 6.8 g/dL (ref 6.5–8.1)
eGFR: 71 mL/min/{1.73_m2} (ref >59)

## 2022-12-01 LAB — GLUCOSE, CAPILLARY: Glucose-Capillary: 157 mg/dL — ABNORMAL HIGH (ref 70–99)

## 2022-12-01 LAB — CBC
HCT: 25.9 % — ABNORMAL LOW (ref 36.0–46.0)
Hematocrit: 28.3 % — ABNORMAL LOW (ref 34.0–46.6)
Hemoglobin: 8.9 g/dL — ABNORMAL LOW (ref 11.1–15.9)
Hemoglobin: 8.2 g/dL — ABNORMAL LOW (ref 12.0–15.0)
MCH: 27.4 pg (ref 26.6–33.0)
MCH: 26.9 pg (ref 26.0–34.0)
MCHC: 31.4 g/dL — ABNORMAL LOW (ref 31.5–35.7)
MCHC: 31.7 g/dL (ref 30.0–36.0)
MCV: 87 fL (ref 79–97)
MCV: 84.9 fL (ref 80.0–100.0)
Platelets: 252 10*3/uL (ref 150–450)
Platelets: 221 10*3/uL (ref 150–400)
RBC: 3.25 x10E6/uL — ABNORMAL LOW (ref 3.77–5.28)
RBC: 3.05 MIL/uL — ABNORMAL LOW (ref 3.87–5.11)
RDW: 13.9 % (ref 11.7–15.4)
RDW: 13.8 % (ref 11.5–15.5)
WBC: 7.7 10*3/uL (ref 3.4–10.8)
WBC: 5.9 10*3/uL (ref 4.0–10.5)
nRBC: 0.5 % — ABNORMAL HIGH (ref 0.0–0.2)

## 2022-12-01 LAB — ABO/RH: ABO/RH(D): A POS

## 2022-12-01 LAB — OCCULT BLOOD X 1 CARD TO LAB, STOOL: Fecal Occult Bld: POSITIVE — AB

## 2022-12-01 MED ORDER — POTASSIUM CHLORIDE 10 MEQ/100ML IV SOLN
10.0000 meq | INTRAVENOUS | Status: AC
  Administered 2022-12-01 – 2022-12-02 (×3): 10 meq via INTRAVENOUS
  Filled 2022-12-01 (×3): qty 100

## 2022-12-01 MED ORDER — IOHEXOL 300 MG/ML  SOLN
100.0000 mL | Freq: Once | INTRAMUSCULAR | Status: AC | PRN
  Administered 2022-12-01: 100 mL via INTRAVENOUS

## 2022-12-01 MED ORDER — POTASSIUM CHLORIDE CRYS ER 20 MEQ PO TBCR
40.0000 meq | EXTENDED_RELEASE_TABLET | Freq: Once | ORAL | Status: AC
  Administered 2022-12-01: 40 meq via ORAL
  Filled 2022-12-01: qty 2

## 2022-12-01 MED ORDER — PANTOPRAZOLE SODIUM 40 MG IV SOLR
40.0000 mg | Freq: Once | INTRAVENOUS | Status: AC
  Administered 2022-12-01: 40 mg via INTRAVENOUS
  Filled 2022-12-01: qty 10

## 2022-12-01 NOTE — Progress Notes (Signed)
Plan of Care Note for accepted transfer   Patient: Lindsey Jordan MRN: [id number]   DOA: 12/01/2022  Facility requesting transfer: [facility] Requesting Provider: Domenic Moras, PA-C Reason for transfer: GI bleeding Facility course: ARMANDO ULLERY is a 72 y.o. female with hx of A-fib on Xarelto, aortic atherosclerosis, HTN, DMT2, prior kidney stones presenting to the ED due to chief complaint of prolonged dark tarry stools and abnormal lab value.     Seen yesterday by cardiology, was informed she had a Hgb of 8.5 by cardiology per patient.  Was also asked about changes in stool consistency, for which patient admitted her stool looked like black tar for the last 3 weeks.  Also has been feeling more lightheaded than normal, especially with positional changes.  Chronic constipation, usually has 1 bowel movement every 5 to 7 days.  Patient believes this is due to her chronic calcium channel blocker use for paroxysmal A-fib.   Recently spoke with gastroenterologist at [facility] today, who recommended stopping Xarelto starting today.  Last dose taken last night.   Denies dizziness, lightheadedness, chest pain, shortness of breath, syncope.  When she came to the ER BP was 140/65 with otherwise normal vital signs.  BMP revealed mild hypokalemia of 3.2 and LFTs were within normal.  H&H were 8.2 and 25.9 compared to 8.9 and 28.3 yesterday.  Blood group was A+.  Stool Hemoccult came back positive.  Abdominal and pelvic CT scan showed no acute intra-abdominal pathology.  It showed moderate hepatomegaly with mild hepatic steatosis and minimal nonobstructing left nephrolithiasis as well as aortic atherosclerosis.  EKG showed sinus tachycardia with rate of 100 with PVCs and poor R wave progression.  The patient was given 40 mg of IV Protonix and 40 mill equivalent p.o. potassium chloride was ordered as well as 10 mill equivalent IV.  Plan of care: The patient is accepted for admission to Telemetry unit, at [facility]. Dr. Paulita Fujita can be notified about the patient when she arrives and will be consulting.  The patient will be under the care and responsibility of the EDP until arrival to [facility].   Author: Christel Mormon, MD 12/01/2022  Check [email or web address] for on-call coverage.  Nursing staff, Please call [facility] number on Amion as soon as patient's arrival, so appropriate admitting provider can evaluate the pt.

## 2022-12-01 NOTE — ED Provider Notes (Signed)
Received signout from previous provider, please see her note for complete H&P.  This is a 72 year old female significant history of atrial fibrillation on Xarelto presenting with concerns of GI bleed.  Patient have noticed recurrent black tarry stools for the past 2 weeks.  Was seen by cardiology office yesterday for follow-up and was noted to have a hemoglobin of 8.9.  She is here today due to complaints of feeling lightheadedness, and persistent black tarry stool.  Patient did reach out to her GI specialist from [facility] who recommended discontinue the Xarelto and therefore her last dose was yesterday.  Workup today is remarkable for hemoglobin of 8.2.  It was 8.9 from yesterday.  Due to a drop in her potassium, potassium supplementation was given.  Appreciate consultation from [facility] hospitalist, Dr. Sidney Ace, who agrees to admit patient to [facility] for further managements of her symptomatic anemia in the setting of being on anticoagulant.  BP 129/65   Pulse 95   Temp 98.9 F (37.2 C)   Resp 14   LMP  (LMP Unknown)   SpO2 99%   Results for orders placed or performed during the hospital encounter of 12/01/22  Comprehensive metabolic panel  Result Value Ref Range   Sodium 137 135 - 145 mmol/L   Potassium 3.2 (L) 3.5 - 5.1 mmol/L   Chloride 99 98 - 111 mmol/L   CO2 26 22 - 32 mmol/L   Glucose, Bld 107 (H) 70 - 99 mg/dL   BUN 17 8 - 23 mg/dL   Creatinine, Ser 0.68 0.44 - 1.00 mg/dL   Calcium 9.2 8.9 - 10.3 mg/dL   Total Protein 6.8 6.5 - 8.1 g/dL   Albumin 4.1 3.5 - 5.0 g/dL   AST 19 15 - 41 U/L   ALT 22 0 - 44 U/L   Alkaline Phosphatase 71 38 - 126 U/L   Total Bilirubin 0.3 0.3 - 1.2 mg/dL   GFR, Estimated >60 >60 mL/min   Anion gap 12 5 - 15  CBC  Result Value Ref Range   WBC 5.9 4.0 - 10.5 K/uL   RBC 3.05 (L) 3.87 - 5.11 MIL/uL   Hemoglobin 8.2 (L) 12.0 - 15.0 g/dL   HCT 25.9 (L) 36.0 - 46.0 %   MCV 84.9 80.0 - 100.0 fL   MCH 26.9 26.0 - 34.0 pg   MCHC 31.7 30.0 -  36.0 g/dL   RDW 13.8 11.5 - 15.5 %   Platelets 221 150 - 400 K/uL   nRBC 0.5 (H) 0.0 - 0.2 %  Occult blood card to lab, stool  Result Value Ref Range   Fecal Occult Bld POSITIVE (A) NEGATIVE  ABO/Rh  Result Value Ref Range   ABO/RH(D)      A POS Performed at [facility] [place] [address] [place], [place] [address]    CT Abdomen Pelvis W Contrast  Result Date: 12/01/2022 CLINICAL DATA:  Abdominal pain, acute, nonlocalized.  Melena. EXAM: CT ABDOMEN AND PELVIS WITH CONTRAST TECHNIQUE: Multidetector CT imaging of the abdomen and pelvis was performed using the standard protocol following bolus administration of intravenous contrast. RADIATION DOSE REDUCTION: This exam was performed according to the departmental dose-optimization program which includes automated exposure control, adjustment of the mA and/or kV according to patient size and/or use of iterative reconstruction technique. CONTRAST:  151m OMNIPAQUE IOHEXOL 300 MG/ML  SOLN COMPARISON:  07/22/2020 FINDINGS: Lower chest: No acute abnormality. Hepatobiliary: Moderate hepatomegaly with the liver measuring 24 cm in craniocaudal dimension.  Mild hepatic steatosis. 3.2 cm benign cavernous hemangioma within the subcapsular inferior right hepatic lobe. No intra or extrahepatic biliary ductal dilation. Gallbladder unremarkable. Pancreas: Unremarkable Spleen: Unremarkable Adrenals/Urinary Tract: The adrenal glands are unremarkable. The kidneys are normal in size and position. 2 mm nonobstructing calculus within the lower pole the left kidney. The kidneys are otherwise unremarkable. Bladder unremarkable. Stomach/Bowel: Stomach is within normal limits. Appendix appears normal. No evidence of bowel wall thickening, distention, or inflammatory changes. Vascular/Lymphatic: Aortic atherosclerosis. No enlarged abdominal or pelvic lymph nodes. Reproductive: Status post hysterectomy. No adnexal masses. Other: No abdominal wall hernia or  abnormality. No abdominopelvic ascites. Musculoskeletal: No no acute bone abnormality. No suspicious lytic or blastic bone lesion. Osseous structures are age-appropriate. IMPRESSION: 1. No acute intra-abdominal pathology identified. No definite radiographic explanation for the patient's reported symptoms. 2. Moderate hepatomegaly. Mild hepatic steatosis. 3. Minimal left nonobstructing nephrolithiasis. No urolithiasis. No hydronephrosis. 4. Aortic atherosclerosis. Aortic Atherosclerosis (ICD10-I70.0). Electronically Signed   By: Fidela Salisbury M.D.   On: 12/01/2022 19:17   DG Chest 2 View  Result Date: 11/04/2022 CLINICAL DATA:  72 year old female with history of cough for the past 10 months. EXAM: CHEST - 2 VIEW COMPARISON:  Chest x-ray 03/14/2022. FINDINGS: Lung volumes are normal. No consolidative airspace disease. No pleural effusions. No pneumothorax. No pulmonary nodule or mass noted. Pulmonary vasculature and the cardiomediastinal silhouette are within normal limits. IMPRESSION: No radiographic evidence of acute cardiopulmonary disease. Electronically Signed   By: Vinnie Langton M.D.   On: 11/04/2022 07:36      Domenic Moras, PA-C 12/01/22 2109    Elgie Congo, MD 12/01/22 [phone]

## 2022-12-01 NOTE — ED Triage Notes (Signed)
Pt states she was visiting cards office yesterday, advised she had hgn 8.5, dark/ tarry stools x "a couple weeks." Associated epigastric/ abd pain. Denies lightheaded/ dizzy feeling. On xarelto for afib, last dose last night. Pt A&O4, NAD during triage

## 2022-12-01 NOTE — ED Provider Notes (Signed)
[facility] Provider Note   CSN: [id number] Arrival date & time: 12/01/22  1634     History  Chief Complaint  Patient presents with   GI Bleeding    Lindsey Jordan is a 72 y.o. female with hx of A-fib on Xarelto, aortic atherosclerosis, HTN, DMT2, prior kidney stones presenting to the ED due to chief complaint of prolonged dark tarry stools and abnormal lab value.    Seen yesterday by cardiology, was informed she had a Hgb of 8.5 by cardiology per patient.  Was also asked about changes in stool consistency, for which patient admitted her stool looked like black tar for the last 3 weeks.  Also has been feeling more lightheaded than normal, especially with positional changes.  Chronic constipation, usually has 1 bowel movement every 5 to 7 days.  Patient believes this is due to her chronic calcium channel blocker use for paroxysmal A-fib.  Recently spoke with gastroenterologist at [facility] today, who recommended stopping Xarelto starting today.  Last dose taken last night.  Denies dizziness, lightheadedness, chest pain, shortness of breath, syncope.  The history is provided by the patient and medical records.       Home Medications Prior to Admission medications   Medication Sig Start Date End Date Taking? Authorizing Provider  acyclovir (ZOVIRAX) 200 MG capsule Take 200 mg by mouth See admin instructions. Take 200 mg by mouth in the morning & take 200 mg by mouth 3 times daily if outbreak presents    [provider]  amitriptyline (ELAVIL) 10 MG tablet Take 5 mg by mouth at bedtime. 04/22/21   [provider]  clonazePAM (KLONOPIN) 1 MG tablet Take 1 mg by mouth at bedtime.    [provider]  diltiazem (CARDIZEM CD) 180 MG 24 hr capsule Take 1 capsule (180 mg total) by mouth daily. 10/19/21 10/19/22  Sherran Needs, NP  diltiazem (CARDIZEM CD) 240 MG 24 hr capsule Take 240 mg by mouth daily. 09/15/22   [provider]  diltiazem (CARDIZEM) 60 MG tablet Take 1 tablet (60 mg total) by mouth 4 (four) times daily as needed. As needed for break through At Fib Patient not taking: Reported on 11/30/2022 10/30/20   Jerline Pain, MD  diltiazem [facility]) 240 MG 24 hr capsule Take 240 mg by mouth daily. 11/03/22   [provider]  doxycycline (VIBRAMYCIN) 100 MG capsule Take 100 mg by mouth 2 (two) times daily. Patient not taking: Reported on 11/30/2022 11/07/22   [provider]  escitalopram (LEXAPRO) 10 MG tablet Take 10 mg by mouth at bedtime.    [provider]  estradiol (ESTRACE) 0.1 MG/GM vaginal cream Place 1 Applicatorful vaginally daily. 03/24/21   [provider]  Evolocumab (REPATHA SURECLICK) XX123456 MG/ML SOAJ INJECT 1 PEN INTO THE SKIN EVERY 14 (FOURTEEN) DAYS. 11/08/21   Jerline Pain, MD  fluticasone (FLONASE) 50 MCG/ACT nasal spray Place 2 sprays into both nostrils daily.    [provider]  levocetirizine (XYZAL) 5 MG tablet Take 5 mg by mouth every evening. Patient not taking: Reported on 11/30/2022    [provider]  metFORMIN (GLUCOPHAGE-XR) 500 MG 24 hr tablet Take 500 mg by mouth at bedtime.    [provider]  nitroGLYCERIN (NITROSTAT) 0.4 MG SL tablet PLACE 1 TABLET UNDER THE TONGUE EVERY 5 MINUTES X 3 DOSES AS NEEDED FOR CHEST PAIN. 01/06/21   Thompson Grayer, MD  Allergies    Clarithromycin, Other, Statins, Welchol [colesevelam hcl], and Welchol [colesevelam]    Review of Systems   Review of Systems  Gastrointestinal:  Positive for blood in stool.    Physical Exam Updated Vital Signs BP 136/67   Pulse 98   Temp 98.9 F (37.2 C)   Resp 14   LMP  (LMP Unknown)   SpO2 97%  Physical Exam Vitals and nursing note reviewed.  Constitutional:      General: She is not in acute distress.    Appearance: She is well-developed. She is not ill-appearing, toxic-appearing or diaphoretic.  HENT:     Head: Normocephalic and  atraumatic.     Mouth/Throat:     Pharynx: Oropharynx is clear.  Eyes:     General: No scleral icterus.    Conjunctiva/sclera: Conjunctivae normal.  Cardiovascular:     Rate and Rhythm: Normal rate and regular rhythm.     Heart sounds: Murmur (At baseline) heard.  Pulmonary:     Effort: Pulmonary effort is normal. No respiratory distress.     Breath sounds: Normal breath sounds.  Chest:     Chest wall: No tenderness.  Abdominal:     General: There is no distension.     Palpations: Abdomen is soft. There is no mass.     Tenderness: There is abdominal tenderness (Generalized upper). There is no right CVA tenderness, left CVA tenderness or guarding.  Genitourinary:    Rectum: Guaiac result positive. No mass, tenderness, anal fissure or external hemorrhoid. Normal anal tone.  Musculoskeletal:        General: No swelling.     Cervical back: Neck supple. No rigidity.     Right lower leg: No edema.     Left lower leg: No edema.  Skin:    General: Skin is warm and dry.     Capillary Refill: Capillary refill takes less than 2 seconds.     Coloration: Skin is pale (Mild). Skin is not jaundiced.     Findings: No erythema or rash.  Neurological:     Mental Status: She is alert and oriented to person, place, and time.  Psychiatric:        Mood and Affect: Mood normal.     ED Results / Procedures / Treatments   Labs (all labs ordered are listed, but only abnormal results are displayed) Labs Reviewed  COMPREHENSIVE METABOLIC PANEL - Abnormal; Notable for the following components:      Result Value   Potassium 3.2 (*)    Glucose, Bld 107 (*)    All other components within normal limits  CBC - Abnormal; Notable for the following components:   RBC 3.05 (*)    Hemoglobin 8.2 (*)    HCT 25.9 (*)    nRBC 0.5 (*)    All other components within normal limits  OCCULT BLOOD X 1 CARD TO LAB, STOOL - Abnormal; Notable for the following components:   Fecal Occult Bld POSITIVE (*)    All  other components within normal limits  ABO/RH    EKG None  Radiology No results found.  Procedures Procedures    Medications Ordered in ED Medications  pantoprazole (PROTONIX) injection 40 mg (has no administration in time range)  iohexol (OMNIPAQUE) 300 MG/ML solution 100 mL (100 mLs Intravenous Contrast Given 12/01/22 1857)    ED Course/ Medical Decision Making/ A&P Clinical Course as of 12/01/22 1924  Thu Dec 01, 2022  1815 Hemoglobin(!): 8.2 Yesterday 8.9 [AC]  1815 HCT(!): 25.9 Yesterday 28.3 [AC]  1815 Potassium(!): 3.2 Yesterday 4.3 [AC]  1914 Consulted with Danton Clap, PA-C of [facility], discussed patient case and presentation in detail.  Aware patient is under the general care of Dr. Paulita Fujita and that patient was recommended to hold Xarelto today.  Discussed recent drops in H&H with positive FOBT.  Agrees with plan for admission, plans to see pt first thing tomorrow morning.  Pt NPO past midnight. [AC]    Clinical Course User Index [AC] Prince Rome, PA-C                             Medical Decision Making Amount and/or Complexity of Data Reviewed Labs: ordered.   72 y.o. female presents to the ED for concern of GI Bleeding     This involves an extensive number of treatment options, and is a complaint that carries with it a high risk of complications and morbidity.  The emergent differential diagnosis prior to evaluation includes, but is not limited to: Diverticulosis, diverticulitis, bowel perforation, bowel obstruction, PUD, perforated ulcer,  This is not an exhaustive differential.   Past Medical History / Co-morbidities / Social History: Hx of paroxysmal A-fib on Xarelto, aortic atherosclerosis, HTN, DMT2, prior kidney stones Social Determinants of Health include: Geriatric  Additional History:  Obtained by chart review.  Notably recent cardiology visit from 11/23/2022 with Dr.Varanasi, see for details.  Lab Tests: I ordered, and personally  interpreted labs.  The pertinent results include:   Worsening anemia since yesterday, Hgb dropped from 8.9-8.2. Worsened potassium since yesterday, was 4.3 now 3.2 WBC 5.9 No AKI, remaining electrolytes do not appear deranged, LFTs normal FOBT positive  Imaging Studies: I ordered imaging studies including CT abd/p -- Pending   Cardiac Monitoring: The patient was maintained on a cardiac monitor.  I personally viewed and interpreted the cardiac monitored which showed an underlying rhythm of: NSR  ED Course / Critical Interventions: Presenting with 2 to 3 weeks of GI bleed on Xarelto.  Noted drop in hemoglobin observed by cardiology, patient spoke with [facility] gastroenterology and was recommended to stop taking the Xarelto today.  No fevers, nausea, vomiting, urinary symptoms.  Does endorse generalized mild upper abdominal tenderness.  Known Hx of lightheadedness with postural changes, however has worsened over the last 2 weeks. Pt overall well-appearing on exam.  Sitting comfortably.  Not diaphoretic.  HDS, in NAD.  Mild upper abdominal tenderness.  Hemodynamically stable with mild tachycardia.  Mildly pale appearing.  Positive FOBT with dark reddish-brown stool on exam.  Plan to proceed with CT abdomen and pelvis, anticipate admission with medicine. Consulted with GI, see notes above.  CT abd/p still pending.  Disposition: 1900  care of Zarinah Hurry Clos transferred to PA Domenic Moras at the end of my shift as the patient will require reassessment once labs/imaging have resulted.  Patient presentation, ED course, and plan of care discussed with review of all pertinent labs and imaging.  Please see his/her note for further details regarding further ED course and disposition.  Plan at time of handoff is pending CT abdomen.  Plan to admit for GI bleed on anticoagulation.  May also consider potassium replenishment and/or blood transfusion.  This may be altered or completely changed at the discretion of the  oncoming team pending results of further workup.  This chart was dictated using voice recognition software.  Despite best efforts to proofread, errors can occur which  can change the documentation meaning.         Final Clinical Impression(s) / ED Diagnoses Final diagnoses:  None    Rx / DC Orders ED Discharge Orders     None         Candace Cruise [id number]    Elgie Congo, MD 12/01/22 2358

## 2022-12-01 NOTE — Addendum Note (Signed)
Addended by: Thompson Grayer on: 12/01/2022 12:01 PM   Modules accepted: Orders

## 2022-12-02 DIAGNOSIS — E876 Hypokalemia: Secondary | ICD-10-CM | POA: Diagnosis present

## 2022-12-02 DIAGNOSIS — D649 Anemia, unspecified: Secondary | ICD-10-CM | POA: Diagnosis not present

## 2022-12-02 DIAGNOSIS — E119 Type 2 diabetes mellitus without complications: Secondary | ICD-10-CM

## 2022-12-02 LAB — GLUCOSE, CAPILLARY
Glucose-Capillary: 111 mg/dL — ABNORMAL HIGH (ref 70–99)
Glucose-Capillary: 100 mg/dL — ABNORMAL HIGH (ref 70–99)
Glucose-Capillary: 118 mg/dL — ABNORMAL HIGH (ref 70–99)
Glucose-Capillary: 111 mg/dL — ABNORMAL HIGH (ref 70–99)
Glucose-Capillary: 82 mg/dL (ref 70–99)

## 2022-12-02 LAB — BASIC METABOLIC PANEL
Anion gap: 8 (ref 5–15)
BUN: 14 mg/dL (ref 8–23)
CO2: 25 mmol/L (ref 22–32)
Calcium: 8.6 mg/dL — ABNORMAL LOW (ref 8.9–10.3)
Chloride: 104 mmol/L (ref 98–111)
Creatinine, Ser: 0.65 mg/dL (ref 0.44–1.00)
GFR, Estimated: >60 mL/min (ref >60)
Glucose, Bld: 108 mg/dL — ABNORMAL HIGH (ref 70–99)
Potassium: 3.9 mmol/L (ref 3.5–5.1)
Sodium: 137 mmol/L (ref 135–145)

## 2022-12-02 LAB — CBC
HCT: 23.8 % — ABNORMAL LOW (ref 36.0–46.0)
Hemoglobin: 7.1 g/dL — ABNORMAL LOW (ref 12.0–15.0)
MCH: 26.7 pg (ref 26.0–34.0)
MCHC: 29.8 g/dL — ABNORMAL LOW (ref 30.0–36.0)
MCV: 89.5 fL (ref 80.0–100.0)
Platelets: 192 10*3/uL (ref 150–400)
RBC: 2.66 MIL/uL — ABNORMAL LOW (ref 3.87–5.11)
RDW: 14.2 % (ref 11.5–15.5)
WBC: 5.6 10*3/uL (ref 4.0–10.5)
nRBC: 0 % (ref 0.0–0.2)

## 2022-12-02 LAB — HEMOGLOBIN AND HEMATOCRIT, BLOOD
HCT: 28.1 % — ABNORMAL LOW (ref 36.0–46.0)
Hemoglobin: 8.8 g/dL — ABNORMAL LOW (ref 12.0–15.0)

## 2022-12-02 LAB — PREPARE RBC (CROSSMATCH)

## 2022-12-02 MED ORDER — PANTOPRAZOLE SODIUM 40 MG IV SOLR
40.0000 mg | Freq: Two times a day (BID) | INTRAVENOUS | Status: DC
  Administered 2022-12-02 – 2022-12-04 (×5): 40 mg via INTRAVENOUS
  Filled 2022-12-02 (×5): qty 10

## 2022-12-02 MED ORDER — ACETAMINOPHEN 500 MG PO TABS
1000.0000 mg | ORAL_TABLET | Freq: Every day | ORAL | Status: DC
  Administered 2022-12-02 (×2): 1000 mg via ORAL
  Filled 2022-12-02 (×2): qty 2

## 2022-12-02 MED ORDER — ONDANSETRON HCL 4 MG PO TABS: 4.0000 mg | ORAL_TABLET | Freq: Four times a day (QID) | ORAL | Status: DC | PRN

## 2022-12-02 MED ORDER — SODIUM CHLORIDE 0.9% FLUSH
3.0000 mL | Freq: Two times a day (BID) | INTRAVENOUS | Status: DC
  Administered 2022-12-02 – 2022-12-03 (×4): 3 mL via INTRAVENOUS

## 2022-12-02 MED ORDER — DILTIAZEM HCL ER COATED BEADS 240 MG PO CP24
240.0000 mg | ORAL_CAPSULE | Freq: Every day | ORAL | Status: DC
  Administered 2022-12-02 – 2022-12-04 (×3): 240 mg via ORAL
  Filled 2022-12-02 (×3): qty 1

## 2022-12-02 MED ORDER — INSULIN ASPART 100 UNIT/ML IJ SOLN
0.0000 [IU] | INTRAMUSCULAR | Status: DC
  Administered 2022-12-03: 2 [IU] via SUBCUTANEOUS
  Administered 2022-12-04: 1 [IU] via SUBCUTANEOUS

## 2022-12-02 MED ORDER — AMITRIPTYLINE HCL 10 MG PO TABS
5.0000 mg | ORAL_TABLET | Freq: Every day | ORAL | Status: DC
  Administered 2022-12-02 – 2022-12-03 (×3): 5 mg via ORAL
  Filled 2022-12-02 (×5): qty 0.5

## 2022-12-02 MED ORDER — ONDANSETRON HCL 4 MG/2ML IJ SOLN: 4.0000 mg | Freq: Four times a day (QID) | INTRAMUSCULAR | Status: DC | PRN

## 2022-12-02 MED ORDER — ESCITALOPRAM OXALATE 10 MG PO TABS
10.0000 mg | ORAL_TABLET | Freq: Every day | ORAL | Status: DC
  Administered 2022-12-02 – 2022-12-03 (×3): 10 mg via ORAL
  Filled 2022-12-02 (×3): qty 1

## 2022-12-02 MED ORDER — POTASSIUM CHLORIDE 10 MEQ/100ML IV SOLN
INTRAVENOUS | Status: AC
  Administered 2022-12-02: 10 meq
  Filled 2022-12-02: qty 100

## 2022-12-02 MED ORDER — CLONAZEPAM 1 MG PO TABS
1.0000 mg | ORAL_TABLET | Freq: Every day | ORAL | Status: DC
  Administered 2022-12-02 – 2022-12-03 (×3): 1 mg via ORAL
  Filled 2022-12-02 (×3): qty 1

## 2022-12-02 MED ORDER — SODIUM CHLORIDE 0.9% IV SOLUTION: Freq: Once | INTRAVENOUS | Status: AC

## 2022-12-02 NOTE — Consult Note (Signed)
[facility] Consult  Referring Provider: [facility] hospitalist Primary Care Physician:  Byrdie Ada, MD Primary Gastroenterologist: Dr. Paulita Fujita  Reason for Consultation: Anemia, black stools  HPI: Lindsey Jordan is a 72 y.o. female with history of paroxysmal atrial fibrillation, on Xarelto, last dose on 11/30/2022 evening, diabetes, hypertension, coronary artery disease, came to the ER after she was found to have severe anemia on outpatient labs.  Patient states she was in her usual state of health until 2 weeks ago when she developed dizziness, lightheadedness and felt like she would lose consciousness.  She saw her cardiologist who recommended getting labs for further evaluation which revealed drop in her hemoglobin from baseline of 14 to around 8, she was advised to stop Xarelto and proceed to the ER.  Patient is normally pretty constipated and has a bowel movement every 3 to 5 days.  In the last 2 weeks she may have had 5 bowel movements, all of them were very dark or black, formed, needed to strain.  She has also noticed small amount of bright red blood which she attributed to hemorrhoids.  She denies unintentional weight loss or loss of appetite. She endorses some early satiety but denies nausea, vomiting, acid reflux or heartburn. Patient intermittently has difficulty swallowing solids, it is not progressive, has never had an endoscopy for evaluation of the same.  She denies use of aspirin, NSAIDs or Goody powders.  Prior GI workup: Colonoscopy, Dr. Paulita Fujita, 05/04/2021: Internal hemorrhoids, diverticulosis of sigmoid and descending, repeat recommended in 5 years for family history of colon polyps. Colonoscopy, Dr. Paulita Fujita, 01/2015: Diverticulosis of sigmoid and descending Colonoscopy, Dr. Paulita Fujita, 2011: Chronic active colitis of transverse colon suspicious for inflammatory bowel disease(patient was recommended to take Lialda 2.4 g twice a day, patient states she never was took the  medicine for the same).   Past Medical History:  Diagnosis Date   Diabetes mellitus without complication ([place])    Hypertension    Kidney stones    Morbid obesity ([place])    Paroxysmal atrial fibrillation ([facility])    PONV (postoperative nausea and vomiting)     Past Surgical History:  Procedure Laterality Date   ABDOMINAL HYSTERECTOMY  2003   ADENOIDECTOMY     age 77   ATRIAL FIBRILLATION ABLATION N/A 04/06/2021   Procedure: ATRIAL FIBRILLATION ABLATION;  Surgeon: Thompson Grayer, MD;  Location: [facility];  Service: Cardiovascular;  Laterality: N/A;   BREAST LUMPECTOMY     x 2 in her 20's   BREAST SURGERY     CESAREAN SECTION     one previous   ECTOPIC PREGNANCY SURGERY     HEMORRHOID SURGERY     in 20's   LEFT HEART CATH AND CORONARY ANGIOGRAPHY N/A 11/23/2020   Procedure: LEFT HEART CATH AND CORONARY ANGIOGRAPHY;  Surgeon: Belva Crome, MD;  Location: [facility];  Service: Cardiovascular;  Laterality: N/A;   TEE WITHOUT CARDIOVERSION N/A 04/06/2021   Procedure: TRANSESOPHAGEAL ECHOCARDIOGRAM (TEE);  Surgeon: Geralynn Rile, MD;  Location: [facility];  Service: Cardiovascular;  Laterality: N/A;   TONSILLECTOMY      Prior to Admission medications   Medication Sig Start Date End Date Taking? Authorizing Provider  acetaminophen (TYLENOL) 500 MG tablet Take 1,000 mg by mouth at bedtime.   Yes [provider]  acyclovir (ZOVIRAX) 200 MG capsule Take 200 mg by mouth 3 (three) times daily as needed (outbreaks).   Yes [provider]  amitriptyline (ELAVIL) 10 MG tablet Take  5 mg by mouth at bedtime. 04/22/21  Yes [provider]  clonazePAM (KLONOPIN) 1 MG tablet Take 1 mg by mouth at bedtime.   Yes [provider]  diltiazem (TIAZAC) 240 MG 24 hr capsule Take 240 mg by mouth daily. 11/03/22  Yes [provider]  escitalopram (LEXAPRO) 10 MG tablet Take 10 mg by mouth at bedtime.   Yes [provider]  Evolocumab  (REPATHA SURECLICK) XX123456 MG/ML SOAJ INJECT 1 PEN INTO THE SKIN EVERY 14 (FOURTEEN) DAYS. 11/08/21  Yes Jerline Pain, MD  fluticasone (FLONASE) 50 MCG/ACT nasal spray Place 2 sprays into both nostrils daily.   Yes [provider]  metFORMIN (GLUCOPHAGE-XR) 500 MG 24 hr tablet Take 500 mg by mouth at bedtime.   Yes [provider]  nitroGLYCERIN (NITROSTAT) 0.4 MG SL tablet PLACE 1 TABLET UNDER THE TONGUE EVERY 5 MINUTES X 3 DOSES AS NEEDED FOR CHEST PAIN. 01/06/21  Yes Allred, Jeneen Rinks, MD  rivaroxaban (XARELTO) 20 MG TABS tablet Take 20 mg by mouth daily.   Yes [provider]  triamterene-hydrochlorothiazide (MAXZIDE-25) 37.5-25 MG tablet Take 1 tablet by mouth daily.   Yes [provider]  diltiazem (CARDIZEM CD) 180 MG 24 hr capsule Take 1 capsule (180 mg total) by mouth daily. 10/19/21 10/19/22  Sherran Needs, NP    Current Facility-Administered Medications  Medication Dose Route Frequency Provider Last Rate Last Admin   0.9 %  sodium chloride infusion (Manually program via Guardrails IV Fluids)   Intravenous Once Barb Merino, MD       acetaminophen (TYLENOL) tablet 1,000 mg  1,000 mg Oral QHS Zada Finders R, MD   1,000 mg at 12/02/22 0155   amitriptyline (ELAVIL) tablet 5 mg  5 mg Oral QHS Zada Finders R, MD   5 mg at 12/02/22 0157   clonazePAM (KLONOPIN) tablet 1 mg  1 mg Oral QHS Zada Finders R, MD   1 mg at 12/02/22 0156   diltiazem (CARDIZEM CD) 24 hr capsule 240 mg  240 mg Oral Daily Zada Finders R, MD   240 mg at 12/02/22 0931   escitalopram (LEXAPRO) tablet 10 mg  10 mg Oral QHS Zada Finders R, MD   10 mg at 12/02/22 0157   insulin aspart (novoLOG) injection 0-9 Units  0-9 Units Subcutaneous Q4H Lenore Cordia, MD       ondansetron (ZOFRAN) tablet 4 mg  4 mg Oral Q6H PRN Lenore Cordia, MD       Or   ondansetron (ZOFRAN) injection 4 mg  4 mg Intravenous Q6H PRN Lenore Cordia, MD       pantoprazole (PROTONIX) injection 40 mg  40 mg  Intravenous Q12H Zada Finders R, MD   40 mg at 12/02/22 0931   sodium chloride flush (NS) 0.9 % injection 3 mL  3 mL Intravenous Q12H Zada Finders R, MD   3 mL at 12/02/22 0159    Allergies as of 12/01/2022 - Review Complete 12/01/2022  Allergen Reaction Noted   Clarithromycin Other (See Comments) 03/26/2013   Other Other (See Comments) 10/11/2021   Statins  04/25/2014   Welchol [colesevelam hcl]  04/25/2014   Welchol [colesevelam] Other (See Comments) 10/11/2021    Family History  Problem Relation Age of Onset   Hypertension Father    Breast cancer Maternal Aunt     Social History   Socioeconomic History   Marital status: Married    Spouse name: Not on file  Number of children: Not on file   Years of education: Not on file   Highest education level: Not on file  Occupational History   Not on file  Tobacco Use   Smoking status: Never   Smokeless tobacco: Never  Substance and Sexual Activity   Alcohol use: No   Drug use: No   Sexual activity: Yes    Birth control/protection: Surgical  Other Topics Concern   Not on file  Social History Narrative   Not on file   Social Determinants of Health   Financial Resource Strain: Not on file  Food Insecurity: No Food Insecurity (12/01/2022)   Hunger Vital Sign    Worried About Running Out of Food in the Last Year: Never true    Ran Out of Food in the Last Year: Never true  Transportation Needs: No Transportation Needs (12/02/2022)   PRAPARE - Hydrologist (Medical): No    Lack of Transportation (Non-Medical): No  Physical Activity: Not on file  Stress: Not on file  Social Connections: Not on file  Intimate Partner Violence: Not At Risk (12/02/2022)   Humiliation, Afraid, Rape, and Kick questionnaire    Fear of Current or Ex-Partner: No    Emotionally Abused: No    Physically Abused: No    Sexually Abused: No    Review of Systems: As per HPI  Physical Exam: Vital signs in last 24  hours: Temp:  [97.9 F (36.6 C)-98.9 F (37.2 C)] 97.9 F (36.6 C) (03/01 0637) Pulse Rate:  [84-103] 84 (03/01 0637) Resp:  [14-18] 18 (03/01 0637) BP: (129-179)/(56-94) 144/58 (03/01 0637) SpO2:  [95 %-99 %] 95 % (03/01 0637) Weight:  [107.5 kg-111.1 kg] 111.1 kg (03/01 0700) Last BM Date :  (pta)  General:   Alert,  Well-developed, well-nourished, pleasant and cooperative in NAD Head:  Normocephalic and atraumatic. Eyes:  Sclera clear, no icterus.   Mild pallor  Ears:  Normal auditory acuity. Nose:  No deformity, discharge,  or lesions. Mouth:  No deformity or lesions.  Oropharynx pink & moist. Neck:  Supple; no masses or thyromegaly. Lungs:  Clear throughout to auscultation.   No wheezes, crackles, or rhonchi. No acute distress. Heart:  Regular rate and rhythm; no murmurs, clicks, rubs,  or gallops. Extremities:  Without clubbing or edema. Neurologic:  Alert and  oriented x4;  grossly normal neurologically. Skin:  Intact without significant lesions or rashes. Psych:  Alert and cooperative. Normal mood and affect. Abdomen:  Soft, mild left lower quadrant tenderness and nondistended. No masses, hepatosplenomegaly or hernias noted. Normal bowel sounds, without guarding, and without rebound.         Lab Results: Recent Labs    11/30/22 1134 12/01/22 1645 12/02/22 0628  WBC 7.7 5.9 5.6  HGB 8.9* 8.2* 7.1*  HCT 28.3* 25.9* 23.8*  PLT 252 221 192   BMET Recent Labs    11/30/22 1134 12/01/22 1645 12/02/22 0628  NA 140 137 137  K 4.3 3.2* 3.9  CL 98 99 104  CO2 '24 26 25  '$ GLUCOSE 139* 107* 108*  BUN '14 17 14  '$ CREATININE 0.87 0.68 0.65  CALCIUM 9.4 9.2 8.6*   LFT Recent Labs    12/01/22 1645  PROT 6.8  ALBUMIN 4.1  AST 19  ALT 22  ALKPHOS 71  BILITOT 0.3   PT/INR No results for input(s): "LABPROT", "INR" in the last 72 hours.  Studies/Results: CT Abdomen Pelvis W Contrast  Result Date: 12/01/2022  CLINICAL DATA:  Abdominal pain, acute, nonlocalized.   Melena. EXAM: CT ABDOMEN AND PELVIS WITH CONTRAST TECHNIQUE: Multidetector CT imaging of the abdomen and pelvis was performed using the standard protocol following bolus administration of intravenous contrast. RADIATION DOSE REDUCTION: This exam was performed according to the departmental dose-optimization program which includes automated exposure control, adjustment of the mA and/or kV according to patient size and/or use of iterative reconstruction technique. CONTRAST:  163m OMNIPAQUE IOHEXOL 300 MG/ML  SOLN COMPARISON:  07/22/2020 FINDINGS: Lower chest: No acute abnormality. Hepatobiliary: Moderate hepatomegaly with the liver measuring 24 cm in craniocaudal dimension. Mild hepatic steatosis. 3.2 cm benign cavernous hemangioma within the subcapsular inferior right hepatic lobe. No intra or extrahepatic biliary ductal dilation. Gallbladder unremarkable. Pancreas: Unremarkable Spleen: Unremarkable Adrenals/Urinary Tract: The adrenal glands are unremarkable. The kidneys are normal in size and position. 2 mm nonobstructing calculus within the lower pole the left kidney. The kidneys are otherwise unremarkable. Bladder unremarkable. Stomach/Bowel: Stomach is within normal limits. Appendix appears normal. No evidence of bowel wall thickening, distention, or inflammatory changes. Vascular/Lymphatic: Aortic atherosclerosis. No enlarged abdominal or pelvic lymph nodes. Reproductive: Status post hysterectomy. No adnexal masses. Other: No abdominal wall hernia or abnormality. No abdominopelvic ascites. Musculoskeletal: No no acute bone abnormality. No suspicious lytic or blastic bone lesion. Osseous structures are age-appropriate. IMPRESSION: 1. No acute intra-abdominal pathology identified. No definite radiographic explanation for the patient's reported symptoms. 2. Moderate hepatomegaly. Mild hepatic steatosis. 3. Minimal left nonobstructing nephrolithiasis. No urolithiasis. No hydronephrosis. 4. Aortic atherosclerosis.  Aortic Atherosclerosis (ICD10-I70.0). Electronically Signed   By: AFidela SalisburyM.D.   On: 12/01/2022 19:17    Impression: Symptomatic anemia, hemoglobin was 14.4 on 04/06/2021 and on admission was 8.9, has slowly trended down to 8.2/7.1 Black/dark stools for 2 weeks, FOBT positive Xarelto, last dose 11/30/2022 evening Intermittent dysphagia to solids  CT abdomen and pelvis without contrast showed no acute intra-abdominal pathology, moderate hepatomegaly, 24 cm with mild hepatic steatosis(no history of significant alcohol use), 3.2 cm benign cavernous hemangioma in the inferior right hepatic lobe   Multiple comorbidities: Paroxysmal atrial fibrillation, diabetes, hypertension  Plan: Clear liquid diet today, n.p.o. postmidnight. EGD with Dr. SMichail Sermonin a.m., possible balloon dilation if obvious esophageal stricture is noted for history of intermittent dysphagia to solids without weight loss. Continue pantoprazole 40 mg every 12 hours. 1 unit PRBC transfusion has been ordered, recommend H&H monitoring and transfuse to keep hemoglobin at least above 7. The risks and the benefits of the procedure were discussed with the patient and her husband at bedside. They understand and verbalized consent.  LOS: 1 day   ARonnette Juniper MD  12/02/2022, 10:03 AM

## 2022-12-02 NOTE — Plan of Care (Signed)

## 2022-12-02 NOTE — Progress Notes (Signed)
PROGRESS NOTE    Lindsey Jordan  [id number] DOB: [birth year] DOA: 12/01/2022 PCP: Alonah Ada, MD    Brief Narrative:  72 y.o. female with medical history significant for PAF on Xarelto, CAD, T2DM, HTN who presented to the ED for evaluation of anemia and dark tarry stools.  Symptoms ongoing for 2 weeks.  Occasional dizziness.  In the doctor's office hemoglobin 8.9, emergency room hemoglobin 8.2, subsequent hemoglobin 7.1 after fluid resuscitation.  Admitted with GI consultation.  Assessment & Plan:   Acute upper GI bleeding, anemia of acute blood loss.  Likely secondary to gastric ulcers.  Coagulopathic on Xarelto.  Baseline hemoglobin 14.4-dropped to 8.9-8.2-7.1.  Patient with dizziness on walking. Patient consented for 1 unit blood transfusion today. Continue IV Protonix.  Hemoglobin every 12 hours.  Transfuse for less than 8. Seen by gastroenterology, scheduled for upper GI endoscopy tomorrow to allow Xarelto washout.  Paroxysmal A-fib: Currently in sinus rhythm.  On diltiazem to rate control.  Holding Xarelto as above.  Hypokalemia: Replaced.  Type 2 diabetes on metformin: Well-controlled.  On sliding scale insulin while in the hospital.  Anxiety/depression: On Klonopin, Lexapro and amitriptyline.  Continued.    DVT prophylaxis: SCDs Start: 12/02/22 0110   Code Status: Full code Family Communication: Husband at bedside Disposition Plan: Status is: Inpatient Remains inpatient appropriate because: Blood transfusions, monitoring, inpatient procedures     Consultants:  Gastroenterology  Procedures:  None  Antimicrobials:  None   Subjective: Patient seen in the morning rounds.  Husband was at the bedside.  No complaints at rest.  She has not really mobilized so feels weak as well not eating anything. Consented for blood transfusion.  Objective: Vitals:   12/02/22 0309 12/02/22 0637 12/02/22 0700 12/02/22 1035  BP: (!) 145/59 (!) 144/58  (!) 156/67   Pulse: 91 84  88  Resp: '18 18  20  '$ Temp: 98.2 F (36.8 C) 97.9 F (36.6 C)  97.9 F (36.6 C)  TempSrc: Oral Oral  Oral  SpO2: 97% 95%  97%  Weight:   111.1 kg   Height:        Intake/Output Summary (Last 24 hours) at 12/02/2022 1036 Last data filed at 12/02/2022 0410 Gross per 24 hour  Intake 596.2 ml  Output --  Net 596.2 ml   Filed Weights   12/01/22 2300 12/02/22 0700  Weight: 107.5 kg 111.1 kg    Examination:  General exam: Appears calm and comfortable , pale looking. Respiratory system: Clear to auscultation. Respiratory effort normal. Cardiovascular system: S1 & S2 heard, RRR. No JVD, murmurs, rubs, gallops or clicks. No pedal edema. Gastrointestinal system: Abdomen is nondistended, soft and nontender. No organomegaly or masses felt. Normal bowel sounds heard. Central nervous system: Alert and oriented. No focal neurological deficits.     Data Reviewed: I have personally reviewed following labs and imaging studies  CBC: Recent Labs  Lab 11/30/22 1134 12/01/22 1645 12/02/22 0628  WBC 7.7 5.9 5.6  HGB 8.9* 8.2* 7.1*  HCT 28.3* 25.9* 23.8*  MCV 87 84.9 89.5  PLT 252 221 [id number]   Basic Metabolic Panel: Recent Labs  Lab 11/30/22 1134 12/01/22 1645 12/02/22 0628  NA 140 137 137  K 4.3 3.2* 3.9  CL 98 99 104  CO2 '24 26 25  '$ GLUCOSE 139* 107* 108*  BUN '14 17 14  '$ CREATININE 0.87 0.68 0.65  CALCIUM 9.4 9.2 8.6*   GFR: Estimated Creatinine Clearance: 81.7 mL/min (by C-G formula based on SCr of 0.65 mg/dL).  Liver Function Tests: Recent Labs  Lab 11/30/22 1134 12/01/22 1645  AST 27 19  ALT 29 22  ALKPHOS 101 71  BILITOT 0.3 0.3  PROT 6.4 6.8  ALBUMIN 4.4 4.1   No results for input(s): "LIPASE", "AMYLASE" in the last 168 hours. No results for input(s): "AMMONIA" in the last 168 hours. Coagulation Profile: No results for input(s): "INR", "PROTIME" in the last 168 hours. Cardiac Enzymes: No results for input(s): "CKTOTAL", "CKMB", "CKMBINDEX",  "TROPONINI" in the last 168 hours. BNP (last 3 results) No results for input(s): "PROBNP" in the last 8760 hours. HbA1C: No results for input(s): "HGBA1C" in the last 72 hours. CBG: Recent Labs  Lab 12/01/22 2246 12/02/22 0355 12/02/22 0730  GLUCAP 157* 111* 100*   Lipid Profile: No results for input(s): "CHOL", "HDL", "LDLCALC", "TRIG", "CHOLHDL", "LDLDIRECT" in the last 72 hours. Thyroid Function Tests: No results for input(s): "TSH", "T4TOTAL", "FREET4", "T3FREE", "THYROIDAB" in the last 72 hours. Anemia Panel: No results for input(s): "VITAMINB12", "FOLATE", "FERRITIN", "TIBC", "IRON", "RETICCTPCT" in the last 72 hours. Sepsis Labs: No results for input(s): "PROCALCITON", "LATICACIDVEN" in the last 168 hours.  No results found for this or any previous visit (from the past 240 hour(s)).       Radiology Studies: CT Abdomen Pelvis W Contrast  Result Date: 12/01/2022 CLINICAL DATA:  Abdominal pain, acute, nonlocalized.  Melena. EXAM: CT ABDOMEN AND PELVIS WITH CONTRAST TECHNIQUE: Multidetector CT imaging of the abdomen and pelvis was performed using the standard protocol following bolus administration of intravenous contrast. RADIATION DOSE REDUCTION: This exam was performed according to the departmental dose-optimization program which includes automated exposure control, adjustment of the mA and/or kV according to patient size and/or use of iterative reconstruction technique. CONTRAST:  184m OMNIPAQUE IOHEXOL 300 MG/ML  SOLN COMPARISON:  07/22/2020 FINDINGS: Lower chest: No acute abnormality. Hepatobiliary: Moderate hepatomegaly with the liver measuring 24 cm in craniocaudal dimension. Mild hepatic steatosis. 3.2 cm benign cavernous hemangioma within the subcapsular inferior right hepatic lobe. No intra or extrahepatic biliary ductal dilation. Gallbladder unremarkable. Pancreas: Unremarkable Spleen: Unremarkable Adrenals/Urinary Tract: The adrenal glands are unremarkable. The kidneys  are normal in size and position. 2 mm nonobstructing calculus within the lower pole the left kidney. The kidneys are otherwise unremarkable. Bladder unremarkable. Stomach/Bowel: Stomach is within normal limits. Appendix appears normal. No evidence of bowel wall thickening, distention, or inflammatory changes. Vascular/Lymphatic: Aortic atherosclerosis. No enlarged abdominal or pelvic lymph nodes. Reproductive: Status post hysterectomy. No adnexal masses. Other: No abdominal wall hernia or abnormality. No abdominopelvic ascites. Musculoskeletal: No no acute bone abnormality. No suspicious lytic or blastic bone lesion. Osseous structures are age-appropriate. IMPRESSION: 1. No acute intra-abdominal pathology identified. No definite radiographic explanation for the patient's reported symptoms. 2. Moderate hepatomegaly. Mild hepatic steatosis. 3. Minimal left nonobstructing nephrolithiasis. No urolithiasis. No hydronephrosis. 4. Aortic atherosclerosis. Aortic Atherosclerosis (ICD10-I70.0). Electronically Signed   By: AFidela SalisburyM.D.   On: 12/01/2022 19:17        Scheduled Meds:  sodium chloride   Intravenous Once   acetaminophen  1,000 mg Oral QHS   amitriptyline  5 mg Oral QHS   clonazePAM  1 mg Oral QHS   diltiazem  240 mg Oral Daily   escitalopram  10 mg Oral QHS   insulin aspart  0-9 Units Subcutaneous Q4H   pantoprazole (PROTONIX) IV  40 mg Intravenous Q12H   sodium chloride flush  3 mL Intravenous Q12H   Continuous Infusions:   LOS: 1 day  Time spent: 35 minutes.  Same-day admit.  No charge visit.    Barb Merino, MD [facility] Pager [phone]

## 2022-12-02 NOTE — H&P (Signed)
History and Physical    Lindsey Jordan [id number] DOB: 03-11-1951 DOA: 12/01/2022  PCP: [place] Ada, MD  Patient coming from: Home  I have personally briefly reviewed patient's old medical records in [place]  Chief Complaint: Anemia, melena  HPI: Lindsey Jordan is a 72 y.o. female with medical history significant for PAF on Xarelto, CAD, T2DM, HTN who presented to the ED for evaluation of anemia and dark tarry stools.  Patient reports over the last 2 weeks she has become lightheaded when she stands up.  She has felt as if she was going to pass out but has not lost consciousness or fallen.  She has seen her heart rate elevated during these episodes.  She saw her cardiologist on 2/28 who recommended holding Maxide and obtain blood work.  This revealed new anemia with hemoglobin of 8.9.  She was advised to hold her Xarelto which she last took evening of 2/28.  Patient does also report seeing dark tarry stools with her bowel movements over the last 7-10 days.  She does see some red blood mixed in as well.  She has had some epigastric discomfort which radiates across her upper abdomen.  She noticed that abdominal pain is worse about an hour after eating.  She says she avoids NSAIDs and uses Tylenol as needed for pain.  [place] ED Course  Labs/Imaging on admission: I have personally reviewed following labs and imaging studies.  Initial vitals showed BP 136/67, pulse 97, RR 14, temp 98.9 F, SpO2 97% on room air.  Labs show WBC 5.9, hemoglobin 8.2, platelets 221,000, sodium 137, potassium 3.2, bicarb 26, BUN 17, creatinine 0.68, serum glucose 107, LFTs within normal limits.  FOBT is positive.  CT abdomen/pelvis with contrast was negative for acute intra-abdominal pathology.  Mild hepatic steatosis and hepatomegaly noted.  Minimal left nonobstructing nephrolithiasis also seen.  Patient was given IV Protonix 40 mg, oral K 40 mEq and IV K 10 mEq x 4.  EDP discussed  case with on-call for [facility], Dr. Randel Pigg, who recommended admission and their team will consult in the morning.  The hospitalist service was consulted to admit for further evaluation and management.  Review of Systems: All systems reviewed and are negative except as documented in history of present illness above.   Past Medical History:  Diagnosis Date   Diabetes mellitus without complication ([place])    Hypertension    Kidney stones    Morbid obesity ([place])    Paroxysmal atrial fibrillation (HCC)    PONV (postoperative nausea and vomiting)     Past Surgical History:  Procedure Laterality Date   ABDOMINAL HYSTERECTOMY  2003   ADENOIDECTOMY     age 46   ATRIAL FIBRILLATION ABLATION N/A 04/06/2021   Procedure: ATRIAL FIBRILLATION ABLATION;  Surgeon: Thompson Grayer, MD;  Location: [place];  Service: Cardiovascular;  Laterality: N/A;   BREAST LUMPECTOMY     x 2 in her 20's   BREAST SURGERY     CESAREAN SECTION     one previous   ECTOPIC PREGNANCY SURGERY     HEMORRHOID SURGERY     in 20's   LEFT HEART CATH AND CORONARY ANGIOGRAPHY N/A 11/23/2020   Procedure: LEFT HEART CATH AND CORONARY ANGIOGRAPHY;  Surgeon: Belva Crome, MD;  Location: [facility];  Service: Cardiovascular;  Laterality: N/A;   TEE WITHOUT CARDIOVERSION N/A 04/06/2021   Procedure: TRANSESOPHAGEAL ECHOCARDIOGRAM (TEE);  Surgeon: Geralynn Rile, MD;  Location:  [facility];  Service: Cardiovascular;  Laterality: N/A;   TONSILLECTOMY      Social History:  reports that she has never smoked. She has never used smokeless tobacco. She reports that she does not drink alcohol and does not use drugs.  Allergies  Allergen Reactions   Clarithromycin Other (See Comments)    Extreme stomach discomfort  Other Reaction(s): Not available, stomach pain   Other Other (See Comments)   Statins     Severe muscle aches with Lipitor (1998) and Crestor (2008)   Welchol [Colesevelam Hcl]     Side effects -  muscle and GI   Welchol [Colesevelam] Other (See Comments)    Family History  Problem Relation Age of Onset   Hypertension Father    Breast cancer Maternal Aunt      Prior to Admission medications   Medication Sig Start Date End Date Taking? Authorizing Provider  acyclovir (ZOVIRAX) 200 MG capsule Take 200 mg by mouth 3 (three) times daily as needed (outbreaks).   Yes [provider]  amitriptyline (ELAVIL) 10 MG tablet Take 5 mg by mouth at bedtime. 04/22/21  Yes [provider]  clonazePAM (KLONOPIN) 1 MG tablet Take 1 mg by mouth at bedtime.   Yes [provider]  diltiazem (TIAZAC) 240 MG 24 hr capsule Take 240 mg by mouth daily. 11/03/22  Yes [provider]  escitalopram (LEXAPRO) 10 MG tablet Take 10 mg by mouth at bedtime.   Yes [provider]  Evolocumab (REPATHA SURECLICK) [id number] MG/ML SOAJ INJECT 1 PEN INTO THE SKIN EVERY 14 (FOURTEEN) DAYS. 11/08/21  Yes Jerline Pain, MD  fluticasone (FLONASE) 50 MCG/ACT nasal spray Place 2 sprays into both nostrils daily.   Yes [provider]  metFORMIN (GLUCOPHAGE-XR) 500 MG 24 hr tablet Take 500 mg by mouth at bedtime.   Yes [provider]  diltiazem (CARDIZEM CD) 180 MG 24 hr capsule Take 1 capsule (180 mg total) by mouth daily. 10/19/21 10/19/22  Sherran Needs, NP  diltiazem (CARDIZEM) 60 MG tablet Take 1 tablet (60 mg total) by mouth 4 (four) times daily as needed. As needed for break through At Fib Patient not taking: Reported on 11/30/2022 10/30/20   Jerline Pain, MD  levocetirizine (XYZAL) 5 MG tablet Take 5 mg by mouth every evening. Patient not taking: Reported on 11/30/2022    [provider]  nitroGLYCERIN (NITROSTAT) 0.4 MG SL tablet PLACE 1 TABLET UNDER THE TONGUE EVERY 5 MINUTES X 3 DOSES AS NEEDED FOR CHEST PAIN. 01/06/21   Thompson Grayer, MD    Physical Exam: Vitals:   12/01/22 1648 12/01/22 2015 12/01/22 2146 12/01/22 2244  BP: 136/67 129/65 (!) 151/56 (!)  179/94  Pulse: 98 95 (!) 103 95  Resp: '14 14 18 18  '$ Temp: 98.9 F (37.2 C)  98.1 F (36.7 C) 98.2 F (36.8 C)  TempSrc:   Oral Oral  SpO2: 97% 99% 97% 98%   Constitutional: Resting in bed, NAD, calm, comfortable Eyes: EOMI, lids and conjunctivae normal ENMT: Mucous membranes are moist. Posterior pharynx clear of any exudate or lesions.Normal dentition.  Neck: normal, supple, no masses. Respiratory: clear to auscultation bilaterally, no wheezing, no crackles. Normal respiratory effort. No accessory muscle use.  Cardiovascular: Regular rate and rhythm, no murmurs / rubs / gallops. No extremity edema. 2+ pedal pulses. Abdomen: no tenderness, no masses palpated.  Musculoskeletal: no clubbing / cyanosis. No joint deformity upper and lower extremities. Good ROM, no contractures. Normal muscle  tone.  Skin: no rashes, lesions, ulcers. No induration Neurologic: Sensation intact. Strength 5/5 in all 4.  Psychiatric: Normal judgment and insight. Alert and oriented x 3. Normal mood.   EKG: Personally reviewed. Sinus rhythm, rate 100, PVCs present.  PVCs new when compared to previous.  Assessment/Plan Principal Problem:   Symptomatic anemia Active Problems:   GI bleeding   Benign hypertensive heart disease without heart failure   Paroxysmal atrial fibrillation (HCC)   Type 2 diabetes mellitus ([place])   Hypokalemia   Lindsey Jordan is a 72 y.o. female with medical history significant for PAF on Xarelto, CAD, T2DM, HTN who is admitted with symptomatic anemia secondary to suspected upper GI bleed.  Assessment and Plan: Symptomatic anemia with suspected upper GI bleed: Hemoglobin 8.9 >> 8.2 over 24 hours with last known hemoglobin 14.05 April 2021.  She does report dark tarry stools and FOBT is positive.  Suspicion is for upper GI bleeding in setting of chronic anticoagulation. -Keep n.p.o. -Holding Xarelto (last dose was 2/28 PM) -IV Protonix 40 mg twice daily -Consult to [facility] in  a.m.  Paroxysmal atrial fibrillation: S/p ablation 04/2021 and remains in sinus rhythm.  Continue diltiazem.  Holding Xarelto as above.  Hypokalemia: Oral and IV supplement given.  Repeat labs in AM.  Type 2 diabetes: Holding metformin and placed on SSI q4h while NPO.  Hypertension: Continue diltiazem.  Maxide has been on hold.  Anxiety/sleep: Continue home Klonopin, Lexapro, amitriptyline.   DVT prophylaxis: SCDs Start: 12/02/22 0110 Code Status: Full code, confirmed with patient on admission Family Communication: Discussed with patient, she has discussed with family Disposition Plan: From home and likely discharge to home pending clinical progress Consults called: [facility] Severity of Illness: The appropriate patient status for this patient is INPATIENT. Inpatient status is judged to be reasonable and necessary in order to provide the required intensity of service to ensure the patient's safety. The patient's presenting symptoms, physical exam findings, and initial radiographic and laboratory data in the context of their chronic comorbidities is felt to place them at high risk for further clinical deterioration. Furthermore, it is not anticipated that the patient will be medically stable for discharge from the hospital within 2 midnights of admission.   * I certify that at the point of admission it is my clinical judgment that the patient will require inpatient hospital care spanning beyond 2 midnights from the point of admission due to high intensity of service, high risk for further deterioration and high frequency of surveillance required.Zada Finders MD [facility]  If 7PM-7AM, please contact night-coverage [email or web address]  12/02/2022, 1:18 AM

## 2022-12-02 NOTE — TOC Progression Note (Signed)
Transition of Care [facility]) - Progression Note    Patient Details  Name: Lindsey Jordan MRN: [id number] Date of Birth: 04-21-1951  Transition of Care [facility]) CM/SW Arrey, RN Phone Number:[phone]  12/02/2022, 8:29 AM  Clinical Narrative:     Transition of Care [facility]) Screening Note   Patient Details  Name: Lindsey Jordan Date of Birth: Jun 11, 1951   Transition of Care [facility]) CM/SW Contact:    Angelita Ingles, RN Phone Number: 12/02/2022, 8:30 AM    Transition of Care Department [facility]) has reviewed patient and no TOC needs have been identified at this time. We will continue to monitor patient advancement through interdisciplinary progression rounds. If new patient transition needs arise, please place a TOC consult.          Expected Discharge Plan and Services                                               Social Determinants of Health (SDOH) Interventions SDOH Screenings   Food Insecurity: No Food Insecurity (12/01/2022)  Housing: Low Risk  (12/02/2022)  Transportation Needs: No Transportation Needs (12/02/2022)  Utilities: Not At Risk (12/02/2022)  Depression (PHQ2-9): Low Risk  (05/24/2019)  Tobacco Use: Low Risk  (12/01/2022)    Readmission Risk Interventions     No data to display

## 2022-12-02 NOTE — Plan of Care (Signed)

## 2022-12-02 NOTE — Hospital Course (Signed)
Lindsey Jordan is a 72 y.o. female with medical history significant for PAF on Xarelto, CAD, T2DM, HTN who is admitted with symptomatic anemia secondary to suspected upper GI bleed.

## 2022-12-02 NOTE — H&P (View-Only) (Signed)
[facility] Consult  Referring Provider: [facility] hospitalist Primary Care Physician:  Jeannetta Ada, MD Primary Gastroenterologist: Dr. Paulita Fujita  Reason for Consultation: Anemia, black stools  HPI: Lindsey Jordan is a 72 y.o. female with history of paroxysmal atrial fibrillation, on Xarelto, last dose on 11/30/2022 evening, diabetes, hypertension, coronary artery disease, came to the ER after she was found to have severe anemia on outpatient labs.  Patient states she was in her usual state of health until 2 weeks ago when she developed dizziness, lightheadedness and felt like she would lose consciousness.  She saw her cardiologist who recommended getting labs for further evaluation which revealed drop in her hemoglobin from baseline of 14 to around 8, she was advised to stop Xarelto and proceed to the ER.  Patient is normally pretty constipated and has a bowel movement every 3 to 5 days.  In the last 2 weeks she may have had 5 bowel movements, all of them were very dark or black, formed, needed to strain.  She has also noticed small amount of bright red blood which she attributed to hemorrhoids.  She denies unintentional weight loss or loss of appetite. She endorses some early satiety but denies nausea, vomiting, acid reflux or heartburn. Patient intermittently has difficulty swallowing solids, it is not progressive, has never had an endoscopy for evaluation of the same.  She denies use of aspirin, NSAIDs or Goody powders.  Prior GI workup: Colonoscopy, Dr. Paulita Fujita, 05/04/2021: Internal hemorrhoids, diverticulosis of sigmoid and descending, repeat recommended in 5 years for family history of colon polyps. Colonoscopy, Dr. Paulita Fujita, 01/2015: Diverticulosis of sigmoid and descending Colonoscopy, Dr. Paulita Fujita, 2011: Chronic active colitis of transverse colon suspicious for inflammatory bowel disease(patient was recommended to take Lialda 2.4 g twice a day, patient states she never was took the  medicine for the same).   Past Medical History:  Diagnosis Date   Diabetes mellitus without complication ([place])    Hypertension    Kidney stones    Morbid obesity ([place])    Paroxysmal atrial fibrillation ([facility])    PONV (postoperative nausea and vomiting)     Past Surgical History:  Procedure Laterality Date   ABDOMINAL HYSTERECTOMY  2003   ADENOIDECTOMY     age 63   ATRIAL FIBRILLATION ABLATION N/A 04/06/2021   Procedure: ATRIAL FIBRILLATION ABLATION;  Surgeon: Thompson Grayer, MD;  Location: [facility];  Service: Cardiovascular;  Laterality: N/A;   BREAST LUMPECTOMY     x 2 in her 20's   BREAST SURGERY     CESAREAN SECTION     one previous   ECTOPIC PREGNANCY SURGERY     HEMORRHOID SURGERY     in 20's   LEFT HEART CATH AND CORONARY ANGIOGRAPHY N/A 11/23/2020   Procedure: LEFT HEART CATH AND CORONARY ANGIOGRAPHY;  Surgeon: Belva Crome, MD;  Location: [facility];  Service: Cardiovascular;  Laterality: N/A;   TEE WITHOUT CARDIOVERSION N/A 04/06/2021   Procedure: TRANSESOPHAGEAL ECHOCARDIOGRAM (TEE);  Surgeon: Geralynn Rile, MD;  Location: [facility];  Service: Cardiovascular;  Laterality: N/A;   TONSILLECTOMY      Prior to Admission medications   Medication Sig Start Date End Date Taking? Authorizing Provider  acetaminophen (TYLENOL) 500 MG tablet Take 1,000 mg by mouth at bedtime.   Yes [provider]  acyclovir (ZOVIRAX) 200 MG capsule Take 200 mg by mouth 3 (three) times daily as needed (outbreaks).   Yes [provider]  amitriptyline (ELAVIL) 10 MG tablet Take  5 mg by mouth at bedtime. 04/22/21  Yes [provider]  clonazePAM (KLONOPIN) 1 MG tablet Take 1 mg by mouth at bedtime.   Yes [provider]  diltiazem (TIAZAC) 240 MG 24 hr capsule Take 240 mg by mouth daily. 11/03/22  Yes [provider]  escitalopram (LEXAPRO) 10 MG tablet Take 10 mg by mouth at bedtime.   Yes [provider]  Evolocumab  (REPATHA SURECLICK) [id number] MG/ML SOAJ INJECT 1 PEN INTO THE SKIN EVERY 14 (FOURTEEN) DAYS. 11/08/21  Yes Jerline Pain, MD  fluticasone (FLONASE) 50 MCG/ACT nasal spray Place 2 sprays into both nostrils daily.   Yes [provider]  metFORMIN (GLUCOPHAGE-XR) 500 MG 24 hr tablet Take 500 mg by mouth at bedtime.   Yes [provider]  nitroGLYCERIN (NITROSTAT) 0.4 MG SL tablet PLACE 1 TABLET UNDER THE TONGUE EVERY 5 MINUTES X 3 DOSES AS NEEDED FOR CHEST PAIN. 01/06/21  Yes Allred, Jeneen Rinks, MD  rivaroxaban (XARELTO) 20 MG TABS tablet Take 20 mg by mouth daily.   Yes [provider]  triamterene-hydrochlorothiazide (MAXZIDE-25) 37.5-25 MG tablet Take 1 tablet by mouth daily.   Yes [provider]  diltiazem (CARDIZEM CD) 180 MG 24 hr capsule Take 1 capsule (180 mg total) by mouth daily. 10/19/21 10/19/22  Sherran Needs, NP    Current Facility-Administered Medications  Medication Dose Route Frequency Provider Last Rate Last Admin   0.9 %  sodium chloride infusion (Manually program via Guardrails IV Fluids)   Intravenous Once Barb Merino, MD       acetaminophen (TYLENOL) tablet 1,000 mg  1,000 mg Oral QHS Zada Finders R, MD   1,000 mg at 12/02/22 0155   amitriptyline (ELAVIL) tablet 5 mg  5 mg Oral QHS Zada Finders R, MD   5 mg at 12/02/22 0157   clonazePAM (KLONOPIN) tablet 1 mg  1 mg Oral QHS Zada Finders R, MD   1 mg at 12/02/22 0156   diltiazem (CARDIZEM CD) 24 hr capsule 240 mg  240 mg Oral Daily Zada Finders R, MD   240 mg at 12/02/22 0931   escitalopram (LEXAPRO) tablet 10 mg  10 mg Oral QHS Zada Finders R, MD   10 mg at 12/02/22 0157   insulin aspart (novoLOG) injection 0-9 Units  0-9 Units Subcutaneous Q4H Lenore Cordia, MD       ondansetron (ZOFRAN) tablet 4 mg  4 mg Oral Q6H PRN Lenore Cordia, MD       Or   ondansetron (ZOFRAN) injection 4 mg  4 mg Intravenous Q6H PRN Lenore Cordia, MD       pantoprazole (PROTONIX) injection 40 mg  40 mg  Intravenous Q12H Zada Finders R, MD   40 mg at 12/02/22 0931   sodium chloride flush (NS) 0.9 % injection 3 mL  3 mL Intravenous Q12H Zada Finders R, MD   3 mL at 12/02/22 0159    Allergies as of 12/01/2022 - Review Complete 12/01/2022  Allergen Reaction Noted   Clarithromycin Other (See Comments) 03/26/2013   Other Other (See Comments) 10/11/2021   Statins  04/25/2014   Welchol [colesevelam hcl]  04/25/2014   Welchol [colesevelam] Other (See Comments) 10/11/2021    Family History  Problem Relation Age of Onset   Hypertension Father    Breast cancer Maternal Aunt     Social History   Socioeconomic History   Marital status: Married    Spouse name: Not on file  Number of children: Not on file   Years of education: Not on file   Highest education level: Not on file  Occupational History   Not on file  Tobacco Use   Smoking status: Never   Smokeless tobacco: Never  Substance and Sexual Activity   Alcohol use: No   Drug use: No   Sexual activity: Yes    Birth control/protection: Surgical  Other Topics Concern   Not on file  Social History Narrative   Not on file   Social Determinants of Health   Financial Resource Strain: Not on file  Food Insecurity: No Food Insecurity (12/01/2022)   Hunger Vital Sign    Worried About Running Out of Food in the Last Year: Never true    Ran Out of Food in the Last Year: Never true  Transportation Needs: No Transportation Needs (12/02/2022)   PRAPARE - Hydrologist (Medical): No    Lack of Transportation (Non-Medical): No  Physical Activity: Not on file  Stress: Not on file  Social Connections: Not on file  Intimate Partner Violence: Not At Risk (12/02/2022)   Humiliation, Afraid, Rape, and Kick questionnaire    Fear of Current or Ex-Partner: No    Emotionally Abused: No    Physically Abused: No    Sexually Abused: No    Review of Systems: As per HPI  Physical Exam: Vital signs in last 24  hours: Temp:  [97.9 F (36.6 C)-98.9 F (37.2 C)] 97.9 F (36.6 C) (03/01 0637) Pulse Rate:  [84-103] 84 (03/01 0637) Resp:  [14-18] 18 (03/01 0637) BP: (129-179)/(56-94) 144/58 (03/01 0637) SpO2:  [95 %-99 %] 95 % (03/01 0637) Weight:  [107.5 kg-111.1 kg] 111.1 kg (03/01 0700) Last BM Date :  (pta)  General:   Alert,  Well-developed, well-nourished, pleasant and cooperative in NAD Head:  Normocephalic and atraumatic. Eyes:  Sclera clear, no icterus.   Mild pallor  Ears:  Normal auditory acuity. Nose:  No deformity, discharge,  or lesions. Mouth:  No deformity or lesions.  Oropharynx pink & moist. Neck:  Supple; no masses or thyromegaly. Lungs:  Clear throughout to auscultation.   No wheezes, crackles, or rhonchi. No acute distress. Heart:  Regular rate and rhythm; no murmurs, clicks, rubs,  or gallops. Extremities:  Without clubbing or edema. Neurologic:  Alert and  oriented x4;  grossly normal neurologically. Skin:  Intact without significant lesions or rashes. Psych:  Alert and cooperative. Normal mood and affect. Abdomen:  Soft, mild left lower quadrant tenderness and nondistended. No masses, hepatosplenomegaly or hernias noted. Normal bowel sounds, without guarding, and without rebound.         Lab Results: Recent Labs    11/30/22 1134 12/01/22 1645 12/02/22 0628  WBC 7.7 5.9 5.6  HGB 8.9* 8.2* 7.1*  HCT 28.3* 25.9* 23.8*  PLT 252 221 192   BMET Recent Labs    11/30/22 1134 12/01/22 1645 12/02/22 0628  NA 140 137 137  K 4.3 3.2* 3.9  CL 98 99 104  CO2 '24 26 25  '$ GLUCOSE 139* 107* 108*  BUN '14 17 14  '$ CREATININE 0.87 0.68 0.65  CALCIUM 9.4 9.2 8.6*   LFT Recent Labs    12/01/22 1645  PROT 6.8  ALBUMIN 4.1  AST 19  ALT 22  ALKPHOS 71  BILITOT 0.3   PT/INR No results for input(s): "LABPROT", "INR" in the last 72 hours.  Studies/Results: CT Abdomen Pelvis W Contrast  Result Date: 12/01/2022  CLINICAL DATA:  Abdominal pain, acute, nonlocalized.   Melena. EXAM: CT ABDOMEN AND PELVIS WITH CONTRAST TECHNIQUE: Multidetector CT imaging of the abdomen and pelvis was performed using the standard protocol following bolus administration of intravenous contrast. RADIATION DOSE REDUCTION: This exam was performed according to the departmental dose-optimization program which includes automated exposure control, adjustment of the mA and/or kV according to patient size and/or use of iterative reconstruction technique. CONTRAST:  117m OMNIPAQUE IOHEXOL 300 MG/ML  SOLN COMPARISON:  07/22/2020 FINDINGS: Lower chest: No acute abnormality. Hepatobiliary: Moderate hepatomegaly with the liver measuring 24 cm in craniocaudal dimension. Mild hepatic steatosis. 3.2 cm benign cavernous hemangioma within the subcapsular inferior right hepatic lobe. No intra or extrahepatic biliary ductal dilation. Gallbladder unremarkable. Pancreas: Unremarkable Spleen: Unremarkable Adrenals/Urinary Tract: The adrenal glands are unremarkable. The kidneys are normal in size and position. 2 mm nonobstructing calculus within the lower pole the left kidney. The kidneys are otherwise unremarkable. Bladder unremarkable. Stomach/Bowel: Stomach is within normal limits. Appendix appears normal. No evidence of bowel wall thickening, distention, or inflammatory changes. Vascular/Lymphatic: Aortic atherosclerosis. No enlarged abdominal or pelvic lymph nodes. Reproductive: Status post hysterectomy. No adnexal masses. Other: No abdominal wall hernia or abnormality. No abdominopelvic ascites. Musculoskeletal: No no acute bone abnormality. No suspicious lytic or blastic bone lesion. Osseous structures are age-appropriate. IMPRESSION: 1. No acute intra-abdominal pathology identified. No definite radiographic explanation for the patient's reported symptoms. 2. Moderate hepatomegaly. Mild hepatic steatosis. 3. Minimal left nonobstructing nephrolithiasis. No urolithiasis. No hydronephrosis. 4. Aortic atherosclerosis.  Aortic Atherosclerosis (ICD10-I70.0). Electronically Signed   By: AFidela SalisburyM.D.   On: 12/01/2022 19:17    Impression: Symptomatic anemia, hemoglobin was 14.4 on 04/06/2021 and on admission was 8.9, has slowly trended down to 8.2/7.1 Black/dark stools for 2 weeks, FOBT positive Xarelto, last dose 11/30/2022 evening Intermittent dysphagia to solids  CT abdomen and pelvis without contrast showed no acute intra-abdominal pathology, moderate hepatomegaly, 24 cm with mild hepatic steatosis(no history of significant alcohol use), 3.2 cm benign cavernous hemangioma in the inferior right hepatic lobe   Multiple comorbidities: Paroxysmal atrial fibrillation, diabetes, hypertension  Plan: Clear liquid diet today, n.p.o. postmidnight. EGD with Dr. SMichail Sermonin a.m., possible balloon dilation if obvious esophageal stricture is noted for history of intermittent dysphagia to solids without weight loss. Continue pantoprazole 40 mg every 12 hours. 1 unit PRBC transfusion has been ordered, recommend H&H monitoring and transfuse to keep hemoglobin at least above 7. The risks and the benefits of the procedure were discussed with the patient and her husband at bedside. They understand and verbalized consent.  LOS: 1 day   ARonnette Juniper MD  12/02/2022, 10:03 AM

## 2022-12-03 ENCOUNTER — Inpatient Hospital Stay (HOSPITAL_COMMUNITY): Payer: Medicare Other | Admitting: Certified Registered Nurse Anesthetist

## 2022-12-03 ENCOUNTER — Encounter (HOSPITAL_COMMUNITY)
Admission: EM | Disposition: A | Discharge status: Home / Self Care | Payer: Self-pay | Source: Home / Self Care | Attending: Internal Medicine

## 2022-12-03 ENCOUNTER — Encounter (HOSPITAL_COMMUNITY): Payer: Self-pay | Admitting: Family Medicine

## 2022-12-03 DIAGNOSIS — R1310 Dysphagia, unspecified: Secondary | ICD-10-CM

## 2022-12-03 DIAGNOSIS — I10 Essential (primary) hypertension: Secondary | ICD-10-CM

## 2022-12-03 DIAGNOSIS — D649 Anemia, unspecified: Secondary | ICD-10-CM | POA: Diagnosis not present

## 2022-12-03 DIAGNOSIS — D62 Acute posthemorrhagic anemia: Secondary | ICD-10-CM

## 2022-12-03 DIAGNOSIS — E119 Type 2 diabetes mellitus without complications: Secondary | ICD-10-CM

## 2022-12-03 DIAGNOSIS — K2970 Gastritis, unspecified, without bleeding: Secondary | ICD-10-CM

## 2022-12-03 DIAGNOSIS — Z7984 Long term (current) use of oral hypoglycemic drugs: Secondary | ICD-10-CM

## 2022-12-03 HISTORY — PX: ESOPHAGOGASTRODUODENOSCOPY (EGD) WITH PROPOFOL: SHX5813

## 2022-12-03 HISTORY — PX: GIVENS CAPSULE STUDY: SHX5432

## 2022-12-03 LAB — GLUCOSE, CAPILLARY
Glucose-Capillary: 100 mg/dL — ABNORMAL HIGH (ref 70–99)
Glucose-Capillary: 104 mg/dL — ABNORMAL HIGH (ref 70–99)
Glucose-Capillary: 107 mg/dL — ABNORMAL HIGH (ref 70–99)
Glucose-Capillary: 116 mg/dL — ABNORMAL HIGH (ref 70–99)
Glucose-Capillary: 162 mg/dL — ABNORMAL HIGH (ref 70–99)
Glucose-Capillary: 85 mg/dL (ref 70–99)

## 2022-12-03 LAB — TYPE AND SCREEN
ABO/RH(D): A POS
Antibody Screen: NEGATIVE
Unit division: 0

## 2022-12-03 LAB — HEMOGLOBIN AND HEMATOCRIT, BLOOD
HCT: 29.9 % — ABNORMAL LOW (ref 36.0–46.0)
HCT: 29.6 % — ABNORMAL LOW (ref 36.0–46.0)
Hemoglobin: 9.1 g/dL — ABNORMAL LOW (ref 12.0–15.0)
Hemoglobin: 8.9 g/dL — ABNORMAL LOW (ref 12.0–15.0)

## 2022-12-03 LAB — BPAM RBC
Blood Product Expiration Date: 202403102359
ISSUE DATE / TIME: 202403011047
Unit Type and Rh: 6200

## 2022-12-03 LAB — SARS CORONAVIRUS 2 BY RT PCR: SARS Coronavirus 2 by RT PCR: POSITIVE — AB

## 2022-12-03 LAB — HEMOGLOBIN A1C
Hgb A1c MFr Bld: 6.3 % — ABNORMAL HIGH (ref 4.8–5.6)
Mean Plasma Glucose: 134 mg/dL

## 2022-12-03 SURGERY — ESOPHAGOGASTRODUODENOSCOPY (EGD) WITH PROPOFOL
Anesthesia: Monitor Anesthesia Care

## 2022-12-03 SURGERY — IMAGING PROCEDURE, GI TRACT, INTRALUMINAL, VIA CAPSULE
Anesthesia: LOCAL

## 2022-12-03 MED ORDER — ONDANSETRON HCL 4 MG/2ML IJ SOLN
INTRAMUSCULAR | Status: DC | PRN
  Administered 2022-12-03: 4 mg via INTRAVENOUS

## 2022-12-03 MED ORDER — PROPOFOL 10 MG/ML IV BOLUS
INTRAVENOUS | Status: DC | PRN
  Administered 2022-12-03: 30 mg via INTRAVENOUS
  Administered 2022-12-03: 10 mg via INTRAVENOUS

## 2022-12-03 MED ORDER — LACTATED RINGERS IV SOLN: INTRAVENOUS | Status: DC | PRN

## 2022-12-03 MED ORDER — DEXTROSE-NACL 5-0.9 % IV SOLN: INTRAVENOUS | Status: DC

## 2022-12-03 MED ORDER — ACETAMINOPHEN 325 MG PO TABS
650.0000 mg | ORAL_TABLET | Freq: Four times a day (QID) | ORAL | Status: DC | PRN
  Administered 2022-12-03 – 2022-12-04 (×3): 650 mg via ORAL
  Filled 2022-12-03 (×3): qty 2

## 2022-12-03 MED ORDER — PROPOFOL 500 MG/50ML IV EMUL
INTRAVENOUS | Status: DC | PRN
  Administered 2022-12-03: 125 ug/kg/min via INTRAVENOUS

## 2022-12-03 MED ORDER — PROPOFOL 10 MG/ML IV BOLUS
INTRAVENOUS | Status: AC
  Filled 2022-12-03: qty 20

## 2022-12-03 MED ORDER — LIDOCAINE 2% (20 MG/ML) 5 ML SYRINGE
INTRAMUSCULAR | Status: DC | PRN
  Administered 2022-12-03: 100 mg via INTRAVENOUS

## 2022-12-03 MED ORDER — PROPOFOL 500 MG/50ML IV EMUL
INTRAVENOUS | Status: AC
  Filled 2022-12-03: qty 50

## 2022-12-03 SURGICAL SUPPLY — 15 items

## 2022-12-03 SURGICAL SUPPLY — 1 items: TOWEL COTTON PACK 4EA (MISCELLANEOUS) ×2 IMPLANT

## 2022-12-03 NOTE — Transfer of Care (Signed)
Immediate Anesthesia Transfer of Care Note  Patient: Lindsey Jordan  Procedure(s) Performed: ESOPHAGOGASTRODUODENOSCOPY (EGD) WITH PROPOFOL  Patient Location: PACU  Anesthesia Type:MAC  Level of Consciousness: awake, alert , and oriented  Airway & Oxygen Therapy: Patient Spontanous Breathing and Patient connected to face mask oxygen  Post-op Assessment: Report given to RN and Post -op Vital signs reviewed and stable  Post vital signs: Reviewed and stable  Last Vitals:  Vitals Value Taken Time  BP    Temp    Pulse    Resp    SpO2      Last Pain:  Vitals:   12/03/22 0706  TempSrc: Temporal  PainSc: 0-No pain      Patients Stated Pain Goal: 0 ([id number])  Complications: No notable events documented.

## 2022-12-03 NOTE — Anesthesia Procedure Notes (Signed)
Procedure Name: MAC Date/Time: 12/03/2022 7:45 AM  Performed by: Maxwell Caul, CRNAPre-anesthesia Checklist: Patient identified, Emergency Drugs available, Suction available and Patient being monitored Oxygen Delivery Method: Simple face mask

## 2022-12-03 NOTE — Op Note (Signed)
[facility] Patient Name: Lindsey Jordan Procedure Date: 12/03/2022 MRN: [id number] Attending MD: Lear Ng , MD, [id number] Date of Birth: [birth year] CSN: [id number] Age: 72 Admit Type: Inpatient Procedure:                Upper GI endoscopy Indications:              Acute post hemorrhagic anemia, Dysphagia Providers:                Lear Ng, MD, Doristine Johns, RN,                            Cherylynn Ridges, Technician, Virgia Land, CRNA Referring MD:             hospital team Medicines:                Propofol per Anesthesia, Monitored Anesthesia Care Complications:            No immediate complications. Estimated Blood Loss:     Estimated blood loss: none. Procedure:                Pre-Anesthesia Assessment:                           - Prior to the procedure, a History and Physical                            was performed, and patient medications and                            allergies were reviewed. The patient's tolerance of                            previous anesthesia was also reviewed. The risks                            and benefits of the procedure and the sedation                            options and risks were discussed with the patient.                            All questions were answered, and informed consent                            was obtained. Prior Anticoagulants: The patient has                            taken Xarelto (rivaroxaban), last dose was stopped                            at admission. ASA Grade Assessment: III - A patient                            with severe systemic disease. After reviewing the  risks and benefits, the patient was deemed in                            satisfactory condition to undergo the procedure.                           After obtaining informed consent, the endoscope was                            passed under direct vision. Throughout the                             procedure, the patient's blood pressure, pulse, and                            oxygen saturations were monitored continuously. The                            [id number] Olympus endoscope was introduced                            through the mouth, and advanced to the second part                            of duodenum. The upper GI endoscopy was                            accomplished without difficulty. The patient                            tolerated the procedure well. Scope In: Scope Out: Findings:      The examined esophagus was normal.      The Z-line was regular and was found 40 cm from the incisors.      Segmental mild inflammation characterized by congestion (edema) and       erythema was found in the stomach.      The exam of the stomach was otherwise normal.      The cardia and gastric fundus were normal on retroflexion.      The examined duodenum was normal. Impression:               - Normal esophagus.                           - Z-line regular, 40 cm from the incisors.                           - Gastritis.                           - Normal examined duodenum.                           - No specimens collected.                           -  No source of bleeding noted on EGD. Moderate Sedation:      N/A - MAC procedure Recommendation:           - To visualize the small bowel, perform video                            capsule endoscopy.                           - NPO.                           - Observe patient's clinical course. Procedure Code(s):        --- Professional ---                           [phone], Esophagogastroduodenoscopy, flexible,                            transoral; diagnostic, including collection of                            specimen(s) by brushing or washing, when performed                            (separate procedure) Diagnosis Code(s):        --- Professional ---                           R13.10, Dysphagia, unspecified                            K29.70, Gastritis, unspecified, without bleeding                           D62, Acute posthemorrhagic anemia CPT copyright 2022 American Medical Association. All rights reserved. The codes documented in this report are preliminary and upon coder review may  be revised to meet current compliance requirements. Lear Ng, MD 12/03/2022 8:10:13 AM This report has been signed electronically. Number of Addenda: 0

## 2022-12-03 NOTE — Anesthesia Postprocedure Evaluation (Signed)
Anesthesia Post Note  Patient: Lindsey Jordan  Procedure(s) Performed: ESOPHAGOGASTRODUODENOSCOPY (EGD) WITH PROPOFOL     Patient location during evaluation: PACU Anesthesia Type: MAC Level of consciousness: awake and alert Pain management: pain level controlled Vital Signs Assessment: post-procedure vital signs reviewed and stable Respiratory status: spontaneous breathing, nonlabored ventilation, respiratory function stable and patient connected to nasal cannula oxygen Cardiovascular status: stable and blood pressure returned to baseline Postop Assessment: no apparent nausea or vomiting Anesthetic complications: no   No notable events documented.  Last Vitals:  Vitals:   12/03/22 0815 12/03/22 0830  BP: (!) 136/52 (!) 139/45  Pulse: 99 99  Resp: 20 (!) 21  Temp:    SpO2: 92% 92%    Last Pain:  Vitals:   12/03/22 0830  TempSrc:   PainSc: 0-No pain                 Belenda Cruise P Shacoria Latif

## 2022-12-03 NOTE — Interval H&P Note (Signed)
History and Physical Interval Note:  12/03/2022 7:44 AM  [name]  has presented today for surgery, with the diagnosis of black,anemia,xarelto last dose  11/30/22 in pm.  The various methods of treatment have been discussed with the patient and family. After consideration of risks, benefits and other options for treatment, the patient has consented to  Procedure(s): ESOPHAGOGASTRODUODENOSCOPY (EGD) WITH PROPOFOL (N/A) as a surgical intervention.  The patient's history has been reviewed, patient examined, no change in status, stable for surgery.  I have reviewed the patient's chart and labs.  Questions were answered to the patient's satisfaction.     Lindsey Jordan

## 2022-12-03 NOTE — Anesthesia Preprocedure Evaluation (Signed)
Anesthesia Evaluation  Patient identified by MRN, date of birth, ID band Patient awake    Reviewed: Allergy & Precautions, NPO status , Patient's Chart, lab work & pertinent test results  History of Anesthesia Complications (+) PONV and history of anesthetic complications  Airway Mallampati: II  TM Distance: >3 FB Neck ROM: Full    Dental no notable dental hx.    Pulmonary neg pulmonary ROS   Pulmonary exam normal        Cardiovascular hypertension, Pt. on medications + dysrhythmias Atrial Fibrillation  Rhythm:Irregular Rate:Normal     Neuro/Psych negative neurological ROS  negative psych ROS   GI/Hepatic negative GI ROS, Neg liver ROS,,,  Endo/Other  diabetes, Type 2, Oral Hypoglycemic Agents    Renal/GU   negative genitourinary   Musculoskeletal negative musculoskeletal ROS (+)    Abdominal Normal abdominal exam  (+)   Peds  Hematology  (+) Blood dyscrasia, anemia Lab Results      Component                Value               Date                      WBC                      5.6                 12/02/2022                HGB                      8.8 (L)             12/02/2022                HCT                      28.1 (L)            12/02/2022                MCV                      89.5                12/02/2022                PLT                      192                 12/02/2022              Anesthesia Other Findings   Reproductive/Obstetrics                             Anesthesia Physical Anesthesia Plan  ASA: 3  Anesthesia Plan: MAC   Post-op Pain Management:    Induction: Intravenous  PONV Risk Score and Plan: 3 and Propofol infusion and Treatment may vary due to age or medical condition  Airway Management Planned: Natural Airway and Simple Face Mask  Additional Equipment: None  Intra-op Plan:   Post-operative Plan:   Informed Consent: I have reviewed the  patients History and Physical, chart, labs and discussed the procedure including  the risks, benefits and alternatives for the proposed anesthesia with the patient or authorized representative who has indicated his/her understanding and acceptance.       Plan Discussed with:   Anesthesia Plan Comments:        Anesthesia Quick Evaluation

## 2022-12-03 NOTE — Progress Notes (Signed)
Pt tested positive for Covid. Provider and [facility] notified. Transfer order placed for room 1521. Report called to nurse receiving patient on [place]. Pt educated/informed on why she was being transferred. Pt removed off tele per protocol and transferred to [place] with GI belt on for the pill cam intact and all of pts belongings with her.

## 2022-12-03 NOTE — Progress Notes (Signed)
Patient and beside RN educated about pill cam and diets ordered. Patient swallowed pill cam at 1147. Endoscopy staff will pick up pill cam in AM of 12/04/22.

## 2022-12-03 NOTE — Progress Notes (Signed)
PROGRESS NOTE    Lindsey Jordan  [id number] DOB: [birth year] DOA: 12/01/2022 PCP: Kristene Ada, MD    Brief Narrative:  72 y.o. female with medical history significant for PAF on Xarelto, CAD, T2DM, HTN who presented to the ED for evaluation of anemia and dark tarry stools.  Symptoms ongoing for 2 weeks.  Occasional dizziness.  In the doctor's office hemoglobin 8.9, emergency room hemoglobin 8.2, subsequent hemoglobin 7.1 after fluid resuscitation.  Admitted with GI consultation.  Assessment & Plan:   Acute upper GI bleeding, anemia of acute blood loss. Coagulopathic on Xarelto.  Baseline hemoglobin 14.4-dropped to 8.9-8.2-7.1-1 unit PRBC transfusion with appropriate response.  Hemoglobin is stable and more than 8 since then. Continue IV Protonix.  Hemoglobin every 12 hours.  Transfuse for less than 8. Upper GI endoscopy today 3/2 with no any abnormality or bleeding lesions. Scheduled for capsule endoscopy today.  Paroxysmal A-fib: Currently in sinus rhythm.  On diltiazem to rate control.  Holding Xarelto as above.  Hypokalemia: Replaced.  Adequate.  Type 2 diabetes on metformin: Well-controlled.  On sliding scale insulin while in the hospital.  Anxiety/depression: On Klonopin, Lexapro and amitriptyline.  Continued.    DVT prophylaxis: SCDs Start: 12/02/22 0110   Code Status: Full code Family Communication: Husband at bedside Disposition Plan: Status is: Inpatient Remains inpatient appropriate because: Blood transfusions, monitoring, inpatient procedures     Consultants:  Gastroenterology  Procedures:  EGD, normal findings  Antimicrobials:  None   Subjective: Came back from EGD.  Hungry.  Has some headache.  Wants to eat.  Objective: Vitals:   12/03/22 0706 12/03/22 0809 12/03/22 0815 12/03/22 0830  BP: (!) 188/68 136/62 (!) 136/52 (!) 139/45  Pulse: (!) 102 99 99 99  Resp: (!) '22 20 20 '$ (!) 21  Temp: 98.9 F (37.2 C) 99 F (37.2 C)    TempSrc:  Temporal     SpO2: 95% 99% 92% 92%  Weight:      Height:        Intake/Output Summary (Last 24 hours) at 12/03/2022 1041 Last data filed at 12/03/2022 [id number] Gross per 24 hour  Intake 1151.67 ml  Output --  Net 1151.67 ml   Filed Weights   12/01/22 2300 12/02/22 0700  Weight: 107.5 kg 111.1 kg    Examination:  General exam: Looks comfortable. Respiratory system: Clear to auscultation. Respiratory effort normal. Cardiovascular system: S1 & S2 heard, RRR. No JVD, murmurs, rubs, gallops or clicks. No pedal edema. Gastrointestinal system: Abdomen is nondistended, soft and nontender. No organomegaly or masses felt. Normal bowel sounds heard. Central nervous system: Alert and oriented. No focal neurological deficits.     Data Reviewed: I have personally reviewed following labs and imaging studies  CBC: Recent Labs  Lab 11/30/22 1134 12/01/22 1645 12/02/22 0628 12/02/22 1632 12/03/22 0624  WBC 7.7 5.9 5.6  --   --   HGB 8.9* 8.2* 7.1* 8.8* 9.1*  HCT 28.3* 25.9* 23.8* 28.1* 29.9*  MCV 87 84.9 89.5  --   --   PLT 252 221 192  --   --    Basic Metabolic Panel: Recent Labs  Lab 11/30/22 1134 12/01/22 1645 12/02/22 0628  NA 140 137 137  K 4.3 3.2* 3.9  CL 98 99 104  CO2 '24 26 25  '$ GLUCOSE 139* 107* 108*  BUN '14 17 14  '$ CREATININE 0.87 0.68 0.65  CALCIUM 9.4 9.2 8.6*   GFR: Estimated Creatinine Clearance: 81.7 mL/min (by C-G formula based on SCr of  0.65 mg/dL). Liver Function Tests: Recent Labs  Lab 11/30/22 1134 12/01/22 1645  AST 27 19  ALT 29 22  ALKPHOS 101 71  BILITOT 0.3 0.3  PROT 6.4 6.8  ALBUMIN 4.4 4.1   No results for input(s): "LIPASE", "AMYLASE" in the last 168 hours. No results for input(s): "AMMONIA" in the last 168 hours. Coagulation Profile: No results for input(s): "INR", "PROTIME" in the last 168 hours. Cardiac Enzymes: No results for input(s): "CKTOTAL", "CKMB", "CKMBINDEX", "TROPONINI" in the last 168 hours. BNP (last 3 results) No  results for input(s): "PROBNP" in the last 8760 hours. HbA1C: Recent Labs    12/02/22 0628  HGBA1C 6.3*   CBG: Recent Labs  Lab 12/02/22 1637 12/02/22 2018 12/03/22 0004 12/03/22 0423 12/03/22 0816  GLUCAP 111* 82 104* 100* 107*   Lipid Profile: No results for input(s): "CHOL", "HDL", "LDLCALC", "TRIG", "CHOLHDL", "LDLDIRECT" in the last 72 hours. Thyroid Function Tests: No results for input(s): "TSH", "T4TOTAL", "FREET4", "T3FREE", "THYROIDAB" in the last 72 hours. Anemia Panel: No results for input(s): "VITAMINB12", "FOLATE", "FERRITIN", "TIBC", "IRON", "RETICCTPCT" in the last 72 hours. Sepsis Labs: No results for input(s): "PROCALCITON", "LATICACIDVEN" in the last 168 hours.  No results found for this or any previous visit (from the past 240 hour(s)).       Radiology Studies: CT Abdomen Pelvis W Contrast  Result Date: 12/01/2022 CLINICAL DATA:  Abdominal pain, acute, nonlocalized.  Melena. EXAM: CT ABDOMEN AND PELVIS WITH CONTRAST TECHNIQUE: Multidetector CT imaging of the abdomen and pelvis was performed using the standard protocol following bolus administration of intravenous contrast. RADIATION DOSE REDUCTION: This exam was performed according to the departmental dose-optimization program which includes automated exposure control, adjustment of the mA and/or kV according to patient size and/or use of iterative reconstruction technique. CONTRAST:  171m OMNIPAQUE IOHEXOL 300 MG/ML  SOLN COMPARISON:  07/22/2020 FINDINGS: Lower chest: No acute abnormality. Hepatobiliary: Moderate hepatomegaly with the liver measuring 24 cm in craniocaudal dimension. Mild hepatic steatosis. 3.2 cm benign cavernous hemangioma within the subcapsular inferior right hepatic lobe. No intra or extrahepatic biliary ductal dilation. Gallbladder unremarkable. Pancreas: Unremarkable Spleen: Unremarkable Adrenals/Urinary Tract: The adrenal glands are unremarkable. The kidneys are normal in size and  position. 2 mm nonobstructing calculus within the lower pole the left kidney. The kidneys are otherwise unremarkable. Bladder unremarkable. Stomach/Bowel: Stomach is within normal limits. Appendix appears normal. No evidence of bowel wall thickening, distention, or inflammatory changes. Vascular/Lymphatic: Aortic atherosclerosis. No enlarged abdominal or pelvic lymph nodes. Reproductive: Status post hysterectomy. No adnexal masses. Other: No abdominal wall hernia or abnormality. No abdominopelvic ascites. Musculoskeletal: No no acute bone abnormality. No suspicious lytic or blastic bone lesion. Osseous structures are age-appropriate. IMPRESSION: 1. No acute intra-abdominal pathology identified. No definite radiographic explanation for the patient's reported symptoms. 2. Moderate hepatomegaly. Mild hepatic steatosis. 3. Minimal left nonobstructing nephrolithiasis. No urolithiasis. No hydronephrosis. 4. Aortic atherosclerosis. Aortic Atherosclerosis (ICD10-I70.0). Electronically Signed   By: AFidela SalisburyM.D.   On: 12/01/2022 19:17        Scheduled Meds:  amitriptyline  5 mg Oral QHS   clonazePAM  1 mg Oral QHS   diltiazem  240 mg Oral Daily   escitalopram  10 mg Oral QHS   insulin aspart  0-9 Units Subcutaneous Q4H   pantoprazole (PROTONIX) IV  40 mg Intravenous Q12H   sodium chloride flush  3 mL Intravenous Q12H   Continuous Infusions:  dextrose 5 % and 0.9% NaCl       LOS:  2 days    Time spent: 35 minutes.      Barb Merino, MD [facility] Pager [phone]

## 2022-12-04 DIAGNOSIS — D649 Anemia, unspecified: Secondary | ICD-10-CM | POA: Diagnosis not present

## 2022-12-04 LAB — GLUCOSE, CAPILLARY
Glucose-Capillary: 125 mg/dL — ABNORMAL HIGH (ref 70–99)
Glucose-Capillary: 124 mg/dL — ABNORMAL HIGH (ref 70–99)
Glucose-Capillary: 102 mg/dL — ABNORMAL HIGH (ref 70–99)
Glucose-Capillary: 84 mg/dL (ref 70–99)
Glucose-Capillary: 75 mg/dL (ref 70–99)

## 2022-12-04 LAB — HEMOGLOBIN AND HEMATOCRIT, BLOOD
HCT: 26 % — ABNORMAL LOW (ref 36.0–46.0)
Hemoglobin: 7.9 g/dL — ABNORMAL LOW (ref 12.0–15.0)

## 2022-12-04 MED ORDER — GUAIFENESIN-DM 100-10 MG/5ML PO SYRP: 5.0000 mL | ORAL_SOLUTION | ORAL | 0 refills | Status: DC | PRN

## 2022-12-04 MED ORDER — NIRMATRELVIR/RITONAVIR (PAXLOVID)TABLET: 3.0000 | ORAL_TABLET | Freq: Two times a day (BID) | ORAL | 0 refills | Status: AC

## 2022-12-04 MED ORDER — GUAIFENESIN-DM 100-10 MG/5ML PO SYRP
5.0000 mL | ORAL_SOLUTION | ORAL | Status: DC | PRN
  Administered 2022-12-04: 5 mL via ORAL
  Filled 2022-12-04: qty 5

## 2022-12-04 MED ORDER — NIRMATRELVIR/RITONAVIR (PAXLOVID)TABLET
3.0000 | ORAL_TABLET | Freq: Two times a day (BID) | ORAL | Status: DC
  Administered 2022-12-04: 3 via ORAL
  Filled 2022-12-04: qty 30

## 2022-12-04 NOTE — Discharge Summary (Signed)
Physician Discharge Summary  Lindsey Jordan [id number] DOB: 1950-12-11 DOA: 12/01/2022  PCP: Amour Ada, MD  Admit date: 12/01/2022 Discharge date: 12/04/2022  Admitted From: home  Disposition:  Home   Recommendations for Outpatient Follow-up:  Follow up with PCP in 1 weeks Please obtain CBC in 3-4 days   Discharge Condition:Stable   CODE STATUS:Full code  Diet recommendation: low salt, soft diet   Discharge Summary: 72 y.o. female with medical history significant for PAF on Xarelto, CAD, T2DM, HTN who presented to the ED for evaluation of anemia and dark tarry stools.  Symptoms ongoing for 2 weeks.  Occasional dizziness.  In the doctor's office hemoglobin 8.9, emergency room hemoglobin 8.2, subsequent hemoglobin 7.1 after fluid resuscitation.  Admitted with GI consultation.  3/2, while she was in the hospital she developed COVID-19 infection with URI symptoms, husband was diagnosed first.   Assessment & Plan:   Acute upper GI bleeding, anemia of acute blood loss. Coagulopathic on Xarelto.   Baseline hemoglobin 14.4-dropped to 8.9-8.2-7.1-1 unit PRBC transfusion with appropriate response.  Hemoglobin is stable and 7.9-8 since then.  Upper GI endoscopy today 3/2 with no any abnormality or bleeding lesions. Capsule endoscopy done 3/2, no major bleeding.  Minor nonbleeding AV malformations. Patient will go back on Xarelto, needs close monitoring.  Will need lab test within a week.   Paroxysmal A-fib: Currently in sinus rhythm.  On diltiazem to rate control.  Resume Xarelto   Hypokalemia: Replaced.  Adequate.   Type 2 diabetes on metformin: Well-controlled.  Resume metformin on discharge.   Anxiety/depression: On Klonopin, Lexapro and amitriptyline.  Continued.  COVID-19 viral infection: Symptomatic treatment.  Paxlovid for 5 days.  Stable for discharge.      Discharge Diagnoses:  Principal Problem:   Symptomatic anemia Active Problems:   GI bleeding   Benign  hypertensive heart disease without heart failure   Paroxysmal atrial fibrillation (HCC)   Type 2 diabetes mellitus ([place])   Hypokalemia   Dysphagia    Discharge Instructions  Discharge Instructions     Diet - low sodium heart healthy   Complete by: As directed    Discharge instructions   Complete by: As directed    Recheck Hb in 3-4 days   Increase activity slowly   Complete by: As directed       Allergies as of 12/04/2022       Reactions   Clarithromycin Other (See Comments)   Extreme stomach discomfort Other Reaction(s): Not available, stomach pain   Other Other (See Comments)   Statins    Severe muscle aches with Lipitor (1998) and Crestor (2008)   Welchol [colesevelam Hcl]    Side effects - muscle and GI   Welchol [colesevelam] Other (See Comments)        Medication List     STOP taking these medications    diltiazem 180 MG 24 hr capsule Commonly known as: Cardizem CD       TAKE these medications    acetaminophen 500 MG tablet Commonly known as: TYLENOL Take 1,000 mg by mouth at bedtime.   acyclovir 200 MG capsule Commonly known as: ZOVIRAX Take 200 mg by mouth 3 (three) times daily as needed (outbreaks).   amitriptyline 10 MG tablet Commonly known as: ELAVIL Take 5 mg by mouth at bedtime.   clonazePAM 1 MG tablet Commonly known as: KLONOPIN Take 1 mg by mouth at bedtime.   diltiazem 240 MG 24 hr capsule Commonly known as: TIAZAC Take 240 mg  by mouth daily.   escitalopram 10 MG tablet Commonly known as: LEXAPRO Take 10 mg by mouth at bedtime.   fluticasone 50 MCG/ACT nasal spray Commonly known as: FLONASE Place 2 sprays into both nostrils daily.   guaiFENesin-dextromethorphan 100-10 MG/5ML syrup Commonly known as: ROBITUSSIN DM Take 5 mLs by mouth every 4 (four) hours as needed for cough (chest congestion).   metFORMIN 500 MG 24 hr tablet Commonly known as: GLUCOPHAGE-XR Take 500 mg by mouth at bedtime.   nirmatrelvir/ritonavir  20 x 150 MG & 10 x '100MG'$  Tabs Commonly known as: PAXLOVID Take 3 tablets by mouth 2 (two) times daily for 5 days. Patient GFR is 81. Take nirmatrelvir (150 mg) two tablets twice daily for 5 days and ritonavir (100 mg) one tablet twice daily for 5 days.   nitroGLYCERIN 0.4 MG SL tablet Commonly known as: NITROSTAT PLACE 1 TABLET UNDER THE TONGUE EVERY 5 MINUTES X 3 DOSES AS NEEDED FOR CHEST PAIN.   Repatha SureClick [id number] MG/ML Soaj Generic drug: Evolocumab INJECT 1 PEN INTO THE SKIN EVERY 14 (FOURTEEN) DAYS.   rivaroxaban 20 MG Tabs tablet Commonly known as: XARELTO Take 20 mg by mouth daily.   triamterene-hydrochlorothiazide 37.5-25 MG tablet Commonly known as: MAXZIDE-25 Take 1 tablet by mouth daily.        Allergies  Allergen Reactions   Clarithromycin Other (See Comments)    Extreme stomach discomfort  Other Reaction(s): Not available, stomach pain   Other Other (See Comments)   Statins     Severe muscle aches with Lipitor (1998) and Crestor (2008)   Welchol [Colesevelam Hcl]     Side effects - muscle and GI   Welchol [Colesevelam] Other (See Comments)    Consultations: Gastroenterology   Procedures/Studies: CT Abdomen Pelvis W Contrast  Result Date: 12/01/2022 CLINICAL DATA:  Abdominal pain, acute, nonlocalized.  Melena. EXAM: CT ABDOMEN AND PELVIS WITH CONTRAST TECHNIQUE: Multidetector CT imaging of the abdomen and pelvis was performed using the standard protocol following bolus administration of intravenous contrast. RADIATION DOSE REDUCTION: This exam was performed according to the departmental dose-optimization program which includes automated exposure control, adjustment of the mA and/or kV according to patient size and/or use of iterative reconstruction technique. CONTRAST:  127m OMNIPAQUE IOHEXOL 300 MG/ML  SOLN COMPARISON:  07/22/2020 FINDINGS: Lower chest: No acute abnormality. Hepatobiliary: Moderate hepatomegaly with the liver measuring 24 cm in  craniocaudal dimension. Mild hepatic steatosis. 3.2 cm benign cavernous hemangioma within the subcapsular inferior right hepatic lobe. No intra or extrahepatic biliary ductal dilation. Gallbladder unremarkable. Pancreas: Unremarkable Spleen: Unremarkable Adrenals/Urinary Tract: The adrenal glands are unremarkable. The kidneys are normal in size and position. 2 mm nonobstructing calculus within the lower pole the left kidney. The kidneys are otherwise unremarkable. Bladder unremarkable. Stomach/Bowel: Stomach is within normal limits. Appendix appears normal. No evidence of bowel wall thickening, distention, or inflammatory changes. Vascular/Lymphatic: Aortic atherosclerosis. No enlarged abdominal or pelvic lymph nodes. Reproductive: Status post hysterectomy. No adnexal masses. Other: No abdominal wall hernia or abnormality. No abdominopelvic ascites. Musculoskeletal: No no acute bone abnormality. No suspicious lytic or blastic bone lesion. Osseous structures are age-appropriate. IMPRESSION: 1. No acute intra-abdominal pathology identified. No definite radiographic explanation for the patient's reported symptoms. 2. Moderate hepatomegaly. Mild hepatic steatosis. 3. Minimal left nonobstructing nephrolithiasis. No urolithiasis. No hydronephrosis. 4. Aortic atherosclerosis. Aortic Atherosclerosis (ICD10-I70.0). Electronically Signed   By: AFidela SalisburyM.D.   On: 12/01/2022 19:17   (Echo, Carotid, EGD, Colonoscopy, ERCP)    Subjective: Patient seen  in the morning rounds.  She had some headache but without fever or shortness of breath.  Hemoglobin 7.9.  Denies any other complaints.  Bowel movements normal.   Discharge Exam: Vitals:   12/04/22 0953 12/04/22 1510  BP: (!) 132/53 (!) 123/53  Pulse: 84 76  Resp: 16 16  Temp: 98.2 F (36.8 C) 98.3 F (36.8 C)  SpO2: 97% 100%   Vitals:   12/04/22 0131 12/04/22 0528 12/04/22 0953 12/04/22 1510  BP: 130/61 (!) 123/54 (!) 132/53 (!) 123/53  Pulse: 99 81 84 76   Resp: '18 18 16 16  '$ Temp: (!) 101 F (38.3 C) 98.3 F (36.8 C) 98.2 F (36.8 C) 98.3 F (36.8 C)  TempSrc: Oral Oral Oral Oral  SpO2: 93% 94% 97% 100%  Weight:      Height:        General: Pt is alert, awake, not in acute distress Cardiovascular: RRR, S1/S2 +, no rubs, no gallops Respiratory: CTA bilaterally, no wheezing, no rhonchi Abdominal: Soft, NT, ND, bowel sounds + Extremities: no edema, no cyanosis    The results of significant diagnostics from this hospitalization (including imaging, microbiology, ancillary and laboratory) are listed below for reference.     Microbiology: Recent Results (from the past 240 hour(s))  SARS Coronavirus 2 by RT PCR (hospital order, performed in [facility] hospital lab) *cepheid single result test* Anterior Nasal Swab     Status: Abnormal   Collection Time: 12/03/22  6:52 PM   Specimen: Anterior Nasal Swab  Result Value Ref Range Status   SARS Coronavirus 2 by RT PCR POSITIVE (A) NEGATIVE Final    Comment: (NOTE) SARS-CoV-2 target nucleic acids are DETECTED  SARS-CoV-2 RNA is generally detectable in upper respiratory specimens  during the acute phase of infection.  Positive results are indicative  of the presence of the identified virus, but do not rule out bacterial infection or co-infection with other pathogens not detected by the test.  Clinical correlation with patient history and  other diagnostic information is necessary to determine patient infection status.  The expected result is negative.  Fact Sheet for Patients:   https://www.patel.info/   Fact Sheet for Healthcare Providers:   https://hall.com/    This test is not yet approved or cleared by the [place] FDA and  has been authorized for detection and/or diagnosis of SARS-CoV-2 by FDA under an Emergency Use Authorization (EUA).  This EUA will remain in effect (meaning this test can be used) for the duration of  the  COVID-19 declaration under Section 564(b)(1)  of the Act, 21 U.S.C. section 360-bbb-3(b)(1), unless the authorization is terminated or revoked sooner.   Performed at [facility], [place] [address] [place], [place] [address]      Labs: BNP (last 3 results) No results for input(s): "BNP" in the last 8760 hours. Basic Metabolic Panel: Recent Labs  Lab 11/30/22 1134 12/01/22 1645 12/02/22 0628  NA 140 137 137  K 4.3 3.2* 3.9  CL 98 99 104  CO2 '24 26 25  '$ GLUCOSE 139* 107* 108*  BUN '14 17 14  '$ CREATININE 0.87 0.68 0.65  CALCIUM 9.4 9.2 8.6*   Liver Function Tests: Recent Labs  Lab 11/30/22 1134 12/01/22 1645  AST 27 19  ALT 29 22  ALKPHOS 101 71  BILITOT 0.3 0.3  PROT 6.4 6.8  ALBUMIN 4.4 4.1   No results for input(s): "LIPASE", "AMYLASE" in the last 168 hours. No results for input(s): "AMMONIA" in the last  168 hours. CBC: Recent Labs  Lab 11/30/22 1134 12/01/22 1645 12/01/22 1645 12/02/22 0628 12/02/22 1632 12/03/22 0624 12/03/22 1700 12/04/22 0542  WBC 7.7 5.9  --  5.6  --   --   --   --   HGB 8.9* 8.2*   < > 7.1* 8.8* 9.1* 8.9* 7.9*  HCT 28.3* 25.9*   < > 23.8* 28.1* 29.9* 29.6* 26.0*  MCV 87 84.9  --  89.5  --   --   --   --   PLT 252 221  --  192  --   --   --   --    < > = values in this interval not displayed.   Cardiac Enzymes: No results for input(s): "CKTOTAL", "CKMB", "CKMBINDEX", "TROPONINI" in the last 168 hours. BNP: Invalid input(s): "POCBNP" CBG: Recent Labs  Lab 12/03/22 2053 12/04/22 0308 12/04/22 0406 12/04/22 0807 12/04/22 1203  GLUCAP 162* 125* 124* 102* 84   D-Dimer No results for input(s): "DDIMER" in the last 72 hours. Hgb A1c Recent Labs    12/02/22 0628  HGBA1C 6.3*   Lipid Profile No results for input(s): "CHOL", "HDL", "LDLCALC", "TRIG", "CHOLHDL", "LDLDIRECT" in the last 72 hours. Thyroid function studies No results for input(s): "TSH", "T4TOTAL", "T3FREE", "THYROIDAB" in the last 72  hours.  Invalid input(s): "FREET3" Anemia work up No results for input(s): "VITAMINB12", "FOLATE", "FERRITIN", "TIBC", "IRON", "RETICCTPCT" in the last 72 hours. Urinalysis    Component Value Date/Time   COLORURINE RED (A) 09/15/2015 2155   APPEARANCEUR TURBID (A) 09/15/2015 2155   LABSPEC 1.034 (H) 09/15/2015 2155   PHURINE 5.0 09/15/2015 2155   GLUCOSEU NEGATIVE 09/15/2015 2155   HGBUR LARGE (A) 09/15/2015 2155   BILIRUBINUR LARGE (A) 09/15/2015 2155   KETONESUR 15 (A) 09/15/2015 2155   PROTEINUR 100 (A) 09/15/2015 2155   NITRITE NEGATIVE 09/15/2015 2155   LEUKOCYTESUR SMALL (A) 09/15/2015 2155   Sepsis Labs Recent Labs  Lab 11/30/22 1134 12/01/22 1645 12/02/22 0628  WBC 7.7 5.9 5.6   Microbiology Recent Results (from the past 240 hour(s))  SARS Coronavirus 2 by RT PCR (hospital order, performed in [place] [facility] hospital lab) *cepheid single result test* Anterior Nasal Swab     Status: Abnormal   Collection Time: 12/03/22  6:52 PM   Specimen: Anterior Nasal Swab  Result Value Ref Range Status   SARS Coronavirus 2 by RT PCR POSITIVE (A) NEGATIVE Final    Comment: (NOTE) SARS-CoV-2 target nucleic acids are DETECTED  SARS-CoV-2 RNA is generally detectable in upper respiratory specimens  during the acute phase of infection.  Positive results are indicative  of the presence of the identified virus, but do not rule out bacterial infection or co-infection with other pathogens not detected by the test.  Clinical correlation with patient history and  other diagnostic information is necessary to determine patient infection status.  The expected result is negative.  Fact Sheet for Patients:   https://www.patel.info/   Fact Sheet for Healthcare Providers:   https://hall.com/    This test is not yet approved or cleared by the [place] FDA and  has been authorized for detection and/or diagnosis of SARS-CoV-2 by FDA under an  Emergency Use Authorization (EUA).  This EUA will remain in effect (meaning this test can be used) for the duration of  the COVID-19 declaration under Section 564(b)(1)  of the Act, 21 U.S.C. section 360-bbb-3(b)(1), unless the authorization is terminated or revoked sooner.   Performed at [facility],  [place] [address] [place], [place] [address]      Time coordinating discharge:  32 minutes  SIGNED:   Barb Merino, MD  [facility] 12/04/2022, 3:38 PM

## 2022-12-04 NOTE — Progress Notes (Signed)
[facility] Gastroenterology Progress Note  Lindsey Jordan 72 y.o. Dec 21, 1950   Subjective: Feels ok. Denies abdominal pain or melena. Tested positive for Covid yesterday evening due to close contact from her husband.   Objective: Vital signs: Vitals:   12/04/22 0953 12/04/22 1510  BP: (!) 132/53 (!) 123/53  Pulse: 84 76  Resp: 16 16  Temp: 98.2 F (36.8 C) 98.3 F (36.8 C)  SpO2: 97% 100%    Physical Exam: Gen: lethargic, well-nourished, no acute distress  HEENT: anicteric sclera CV: RRR Chest: CTA B Abd: soft, nontender, nondistended, +BS Ext: no edema  Lab Results: Recent Labs    12/01/22 1645 12/02/22 0628  NA 137 137  K 3.2* 3.9  CL 99 104  CO2 26 25  GLUCOSE 107* 108*  BUN 17 14  CREATININE 0.68 0.65  CALCIUM 9.2 8.6*   Recent Labs    12/01/22 1645  AST 19  ALT 22  ALKPHOS 71  BILITOT 0.3  PROT 6.8  ALBUMIN 4.1   Recent Labs    12/01/22 1645 12/02/22 0628 12/02/22 1632 12/03/22 1700 12/04/22 0542  WBC 5.9 5.6  --   --   --   HGB 8.2* 7.1*   < > 8.9* 7.9*  HCT 25.9* 23.8*   < > 29.6* 26.0*  MCV 84.9 89.5  --   --   --   PLT 221 192  --   --   --    < > = values in this interval not displayed.      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 Hgb dropped to 7.9 but no overt bleeding. Stable to go home from my standpoint. Needs a CBC checked mid-week. not sure if she can get done at [facility] central lab with her positive covid but that needs to be determined. Ok to resume Xarelto from my standpoint. If anemia persists, then needs outpt Hematology consult. Will sign off. Call if questions.     Lear Ng 12/04/2022, 3:28 PM  Questions please call [phone] ID: [name], female   DOB: 07/01/1951, 72 y.o.   MRN: [id number]

## 2022-12-04 NOTE — Progress Notes (Signed)
Pill cam removed 2350 per paper. Placed in bag for endo to pick up in a.m

## 2022-12-06 ENCOUNTER — Encounter: Payer: Self-pay | Admitting: Cardiology

## 2022-12-07 ENCOUNTER — Encounter (HOSPITAL_COMMUNITY): Payer: Self-pay | Admitting: Gastroenterology

## 2022-12-08 DIAGNOSIS — D649 Anemia, unspecified: Secondary | ICD-10-CM | POA: Diagnosis not present

## 2022-12-09 NOTE — Telephone Encounter (Signed)
Please see the MyChart message reply(ies) for my assessment and plan.    Thank you for your message seeking medical advice.* My assessment and recommendation are as follows: Agree with plan.  Reviewed hospital records. GI doctor said to resume Xarelto Lets make sure that you have a CBC next week.  If not already scheduled with your PCP, Dr. Concha Ada, we can do this at our office  It looks like you have a follow-up visit to with our team as well soon.  Sincerely,  Candee Furbish, MD     This patient gave consent for this Medical Advice Message and is aware that it may result in a bill to their insurance company, as well as the possibility of receiving a bill for a co-payment or deductible. They are an established patient, but are not seeking medical advice exclusively about a problem treated during an in person or video visit in the last seven days. I did not recommend an in person or video visit within seven days of my reply.    I spent a total of 5 minutes cumulative time within 7 days through [employer].  Candee Furbish, MD

## 2022-12-15 DIAGNOSIS — D508 Other iron deficiency anemias: Secondary | ICD-10-CM | POA: Diagnosis not present

## 2022-12-21 DIAGNOSIS — F419 Anxiety disorder, unspecified: Secondary | ICD-10-CM | POA: Diagnosis not present

## 2022-12-21 DIAGNOSIS — D509 Iron deficiency anemia, unspecified: Secondary | ICD-10-CM | POA: Diagnosis not present

## 2022-12-21 DIAGNOSIS — I4891 Unspecified atrial fibrillation: Secondary | ICD-10-CM | POA: Diagnosis not present

## 2022-12-21 DIAGNOSIS — G4700 Insomnia, unspecified: Secondary | ICD-10-CM | POA: Diagnosis not present

## 2022-12-22 ENCOUNTER — Ambulatory Visit (INDEPENDENT_AMBULATORY_CARE_PROVIDER_SITE_OTHER): Payer: Medicare Other

## 2022-12-22 DIAGNOSIS — I480 Paroxysmal atrial fibrillation: Secondary | ICD-10-CM | POA: Diagnosis not present

## 2022-12-22 DIAGNOSIS — R42 Dizziness and giddiness: Secondary | ICD-10-CM | POA: Diagnosis not present

## 2022-12-22 NOTE — Progress Notes (Signed)
Patient reported feeling lightheaded during echo appt. Patient attributes this to severe anemia. Offered to take patient to emergency room. Patient refused and said she would follow up with her doctor. Escorted patient to her car via wheelchair.

## 2022-12-23 LAB — ECHOCARDIOGRAM COMPLETE
Area-P 1/2: 4.31 cm2
MV M vel: 2.65 m/s
MV Peak grad: 28.1 mmHg
S' Lateral: 2.73 cm

## 2022-12-27 DIAGNOSIS — D509 Iron deficiency anemia, unspecified: Secondary | ICD-10-CM | POA: Diagnosis not present

## 2022-12-28 ENCOUNTER — Ambulatory Visit (INDEPENDENT_AMBULATORY_CARE_PROVIDER_SITE_OTHER): Payer: Medicare Other

## 2022-12-28 ENCOUNTER — Ambulatory Visit: Payer: Medicare Other | Attending: Physician Assistant | Admitting: Physician Assistant

## 2022-12-28 ENCOUNTER — Encounter: Payer: Self-pay | Admitting: Physician Assistant

## 2022-12-28 VITALS — BP 132/72 | HR 98 | Ht 67.0 in | Wt 236.8 lb

## 2022-12-28 DIAGNOSIS — G90A Postural orthostatic tachycardia syndrome (POTS): Secondary | ICD-10-CM | POA: Diagnosis not present

## 2022-12-28 DIAGNOSIS — Z79899 Other long term (current) drug therapy: Secondary | ICD-10-CM

## 2022-12-28 DIAGNOSIS — I10 Essential (primary) hypertension: Secondary | ICD-10-CM | POA: Diagnosis not present

## 2022-12-28 DIAGNOSIS — I25118 Atherosclerotic heart disease of native coronary artery with other forms of angina pectoris: Secondary | ICD-10-CM

## 2022-12-28 DIAGNOSIS — I480 Paroxysmal atrial fibrillation: Secondary | ICD-10-CM

## 2022-12-28 DIAGNOSIS — D6869 Other thrombophilia: Secondary | ICD-10-CM

## 2022-12-28 DIAGNOSIS — E7800 Pure hypercholesterolemia, unspecified: Secondary | ICD-10-CM | POA: Diagnosis not present

## 2022-12-28 MED ORDER — METOPROLOL TARTRATE 25 MG PO TABS: 12.5000 mg | ORAL_TABLET | Freq: Two times a day (BID) | ORAL | 3 refills | Status: DC

## 2022-12-28 NOTE — Progress Notes (Signed)
Office Visit    Patient Name: Lindsey Jordan Date of Encounter: 12/28/2022  PCP:  Lindsey Jordan, [place]  Cardiologist:  Lindsey Furbish, MD  Advanced Practice Provider:  No care team member to display Electrophysiologist:  None   HPI    Lindsey Jordan is a 72 y.o. female with a past medical history of atrial fibrillation and ablation several years ago, hypertension, morbid obesity, diabetes mellitus without complication presents today for follow-up appointment.  She called a few days prior to her last appointment and reported upon rising from a seated to a horizontal position, her heart rate rose from normal to 120 bpm.  She felt as if she was going to fall or blackout and she felt weak.  She also felt like she needed to be seen a soon as possible.  She has heard of POTS but was worried that if it is POTS she might need a medication check and possibly an adjustment of medications.  She does take Xarelto and did not start any new medications since.  She states it did not feel like atrial fibrillation.  Last cardiac catheterization February 2022 which showed 1st diag 40% stenosed and 40% stenosis of ostial to mid LAD lesion.  She has total occlusion of the distal third of the second obtuse marginal.  The marginal has left to left and right to left collaterals.  Small first OM with diffuse disease.  Proximal to mid circumflex contains minimal luminal irregularities.  Mild to moderate proximal to mid RCA with diffuse disease.  Mid PDA 80 to 90% focal stenosis.  RCA is dominant.  Left main widely patent.  Segmental proximal to mid 40 to 50% LAD.  Large first diagonal contains proximal to mid diffuse 30% narrowing.  Normal LVEF 60%.  She was seen by Dr. Irish Jordan about a month ago and at that time her heart rate had been elevated with minimal activity.  She was told to stop her Maxide to see if it would help with her lightheadedness.  She was encouraged to stay  hydrated and decrease caffeine intake.  An echocardiogram was ordered as well as labs.  Echo showed normal heart pump function and normal valvular function.  Today, she tells me that she has had issues with her hemoglobin.  She had a drop to 8.8 and ended up having an endoscopy which was okay.  No bleed found.  Her stool was dark and tarry but then changed.  Most recent hemoglobin that we have is 9.8 (3/14).  She showed me on her phone that more recently her hemoglobin had come up to 10.2.  Trending in the right direction.  She has an appointment with Dr. Earlie Jordan for a workup to see if she has a blood disorder. 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  She said she had a cough for 10 months.  Her sickness included RSV and she recently had a chest x-ray that looked okay.  Heart rate was increasing when she just went from sitting to standing, 120s.  This seems to have continued even with her increase in hemoglobin.  Not convinced that her tachycardia is solely related to her anemia. Her blood pressure is well-controlled today and her heart rate is 98 bpm.  We discussed initiating beta-blocker therapy for better control.  Reports no shortness of breath nor dyspnea on exertion. Reports no chest pain, pressure, or tightness. No edema, orthopnea, PND.  Past Medical History    Past Medical History:  Diagnosis Date   Diabetes mellitus without complication ([place])    Hypertension    Kidney stones    Morbid obesity ([place])    Paroxysmal atrial fibrillation ([facility])    PONV (postoperative nausea and vomiting)    Past Surgical History:  Procedure Laterality Date   ABDOMINAL HYSTERECTOMY  2003   ADENOIDECTOMY     age 31   ATRIAL FIBRILLATION ABLATION N/A 04/06/2021   Procedure: ATRIAL FIBRILLATION ABLATION;  Surgeon: Lindsey Grayer, MD;  Location: [facility];  Service: Cardiovascular;  Laterality: N/A;   BREAST LUMPECTOMY     x 2 in her 20's   BREAST SURGERY     CESAREAN  SECTION     one previous   ECTOPIC PREGNANCY SURGERY     ESOPHAGOGASTRODUODENOSCOPY (EGD) WITH PROPOFOL N/A 12/03/2022   Procedure: ESOPHAGOGASTRODUODENOSCOPY (EGD) WITH PROPOFOL;  Surgeon: Lindsey Corner, MD;  Location: [facility];  Service: Gastroenterology;  Laterality: N/A;   GIVENS CAPSULE STUDY N/A 12/03/2022   Procedure: GIVENS CAPSULE STUDY;  Surgeon: Lindsey Corner, MD;  Location: [facility];  Service: Gastroenterology;  Laterality: N/A;   HEMORRHOID SURGERY     in 20's   LEFT HEART CATH AND CORONARY ANGIOGRAPHY N/A 11/23/2020   Procedure: LEFT HEART CATH AND CORONARY ANGIOGRAPHY;  Surgeon: Lindsey Crome, MD;  Location: [facility];  Service: Cardiovascular;  Laterality: N/A;   TEE WITHOUT CARDIOVERSION N/A 04/06/2021   Procedure: TRANSESOPHAGEAL ECHOCARDIOGRAM (TEE);  Surgeon: Lindsey Rile, MD;  Location: [facility];  Service: Cardiovascular;  Laterality: N/A;   TONSILLECTOMY      Allergies  Allergies  Allergen Reactions   Clarithromycin Other (See Comments)    Extreme stomach discomfort  Other Reaction(s): Not available, stomach pain   Other Other (See Comments)   Statins     Severe muscle aches with Lipitor (1998) and Crestor (2008)   Welchol [Colesevelam Hcl]     Side effects - muscle and GI   Welchol [Colesevelam] Other (See Comments)    EKGs/Labs/Other Studies Reviewed:   The following studies were reviewed today:  Echo 12/22/22 IMPRESSIONS     1. Left ventricular ejection fraction, by estimation, is 55 to 60%. Left  ventricular ejection fraction by 3D volume is 55 %. The left ventricle has  normal function. The left ventricle has no regional wall motion  abnormalities. There is mild left  ventricular hypertrophy. Left ventricular diastolic parameters are  indeterminate.   2. Right ventricular systolic function is mildly reduced. The right  ventricular size is normal. There is normal pulmonary artery systolic  pressure. The estimated  right ventricular systolic pressure is 123456 mmHg.   3. The mitral valve is grossly normal. Trivial mitral valve  regurgitation. No evidence of mitral stenosis.   4. The aortic valve is tricuspid. There is mild calcification of the  aortic valve. There is mild thickening of the aortic valve. Aortic valve  regurgitation is not visualized. Aortic valve sclerosis/calcification is  present, without any evidence of  aortic stenosis.   5. The inferior vena cava is normal in size with greater than 50%  respiratory variability, suggesting right atrial pressure of 3 mmHg.   FINDINGS   Left Ventricle: Left ventricular ejection fraction, by estimation, is 55  to 60%. Left ventricular ejection fraction by 3D volume is 55 %. The left  ventricle has normal function. The left ventricle has no regional wall  motion abnormalities. Global  longitudinal strain  performed but not reported based on interpreter  judgement due to suboptimal tracking. The left ventricular internal cavity  size was normal in size. There is mild left ventricular hypertrophy. Left  ventricular diastolic parameters are  indeterminate.   Right Ventricle: The right ventricular size is normal. No increase in  right ventricular wall thickness. Right ventricular systolic function is  mildly reduced. There is normal pulmonary artery systolic pressure. The  tricuspid regurgitant velocity is 2.45  m/s, and with an assumed right atrial pressure of 3 mmHg, the estimated  right ventricular systolic pressure is 123456 mmHg.   Left Atrium: Left atrial size was normal in size.   Right Atrium: Right atrial size was normal in size.   Pericardium: There is no evidence of pericardial effusion.   Mitral Valve: The mitral valve is grossly normal. Trivial mitral valve  regurgitation. No evidence of mitral valve stenosis.   Tricuspid Valve: The tricuspid valve is normal in structure. Tricuspid  valve regurgitation is trivial. No evidence of  tricuspid stenosis.   Aortic Valve: The aortic valve is tricuspid. There is mild calcification  of the aortic valve. There is mild thickening of the aortic valve. Aortic  valve regurgitation is not visualized. Aortic valve  sclerosis/calcification is present, without any  evidence of aortic stenosis.   Pulmonic Valve: The pulmonic valve was normal in structure. Pulmonic valve  regurgitation is trivial. No evidence of pulmonic stenosis.   Aorta: The aortic root is normal in size and structure.   Venous: The inferior vena cava is normal in size with greater than 50%  respiratory variability, suggesting right atrial pressure of 3 mmHg.   IAS/Shunts: No atrial level shunt detected by color flow Doppler.   Additional Comments: There is a small pleural effusion in the left lateral  region.    EKG:  EKG is not ordered today.   Recent Labs: 12/01/2022: ALT 22 12/02/2022: BUN 14; Creatinine, Ser 0.65; Platelets 192; Potassium 3.9; Sodium 137 12/04/2022: Hemoglobin 7.9  Recent Lipid Panel    Component Value Date/Time   CHOL 161 02/01/2021 1031   TRIG 231 (H) 02/01/2021 1031   HDL 48 02/01/2021 1031   CHOLHDL 3.4 02/01/2021 1031   CHOLHDL 3.7 09/14/2015 0852   VLDL 32 (H) 09/14/2015 0852   LDLCALC 75 02/01/2021 1031   LDLDIRECT 86.0 10/15/2014 0743    Risk Assessment/Calculations:   CHA2DS2-VASc Score = 4   This indicates a 4.8% annual risk of stroke. The patient's score is based upon: CHF History: 0 HTN History: 1 Diabetes History: 1 Stroke History: 0 Vascular Disease History: 0 Age Score: 1 Gender Score: 1    Home Medications   Current Meds  Medication Sig   acetaminophen (TYLENOL) 500 MG tablet Take 1,000 mg by mouth at bedtime.   acyclovir (ZOVIRAX) 200 MG capsule Take 200 mg by mouth 3 (three) times daily as needed (outbreaks).   amitriptyline (ELAVIL) 10 MG tablet Take 5 mg by mouth at bedtime.   clonazePAM (KLONOPIN) 1 MG tablet Take 1 mg by mouth at bedtime.    diltiazem (CARDIZEM CD) 240 MG 24 hr capsule Take 240 mg by mouth daily.   escitalopram (LEXAPRO) 10 MG tablet Take 10 mg by mouth at bedtime.   Evolocumab (REPATHA SURECLICK) XX123456 MG/ML SOAJ INJECT 1 PEN INTO THE SKIN EVERY 14 (FOURTEEN) DAYS.   fluticasone (FLONASE) 50 MCG/ACT nasal spray Place 2 sprays into both nostrils daily.   metFORMIN (GLUCOPHAGE-XR) 500 MG 24 hr tablet Take 500 mg by  mouth at bedtime.   metoprolol tartrate (LOPRESSOR) 25 MG tablet Take 0.5 tablets (12.5 mg total) by mouth 2 (two) times daily.   nitroGLYCERIN (NITROSTAT) 0.4 MG SL tablet PLACE 1 TABLET UNDER THE TONGUE EVERY 5 MINUTES X 3 DOSES AS NEEDED FOR CHEST PAIN.   rivaroxaban (XARELTO) 20 MG TABS tablet Take 20 mg by mouth daily.   triamterene-hydrochlorothiazide (MAXZIDE-25) 37.5-25 MG tablet Take 1 tablet by mouth daily.   [DISCONTINUED] diltiazem (TIAZAC) 240 MG 24 hr capsule Take 240 mg by mouth daily.     Review of Systems      All other systems reviewed and are otherwise negative except as noted above.  Physical Exam    VS:  BP 132/72   Pulse 98   Ht 5\' 7"  (1.702 m)   Wt 236 lb 12.8 oz (107.4 kg)   LMP  (LMP Unknown)   SpO2 97%   BMI 37.09 kg/m  , BMI Body mass index is 37.09 kg/m.  Wt Readings from Last 3 Encounters:  12/28/22 236 lb 12.8 oz (107.4 kg)  12/02/22 244 lb 14.9 oz (111.1 kg)  11/30/22 235 lb 6.4 oz (106.8 kg)     GEN: Well nourished, well developed, in no acute distress. HEENT: normal. Neck: Supple, no JVD, carotid bruits, or masses. Cardiac: RRR, no murmurs, rubs, or gallops. No clubbing, cyanosis, edema.  Radials/PT 2+ and equal bilaterally.  Respiratory:  Respirations regular and unlabored, clear to auscultation bilaterally. GI: Soft, nontender, nondistended. MS: No deformity or atrophy. Skin: Warm and dry, no rash. Neuro:  Strength and sensation are intact. Psych: Normal affect.  Assessment & Plan    PAF s/p ablation -She is on diltiazem 240 mg daily -She is on  Xarelto 20 mg daily for stroke prophylaxis -We discussed adding metoprolol to tartrate 12 and half twice a day and asked her to keep a close eye on her blood pressure and heart rate -Heart rate today was 98 and blood pressure was 132/72 so there is plenty of room -normal echo -will order a monitor today   Acquired thrombophilia -on xarelto  Obesity -recommended a low-sodium, heart healthy diet  CAD without angina -continue current medical therapy -no chest pain today  Hypertension -trying to keep her BP up in the setting of probable POTs (base on patient history)         Disposition: Follow up 2-3 months with Lindsey Furbish, MD or APP.  Signed, Elgie Collard, PA-C 12/28/2022, 6:49 PM [facility]

## 2022-12-28 NOTE — Progress Notes (Unsigned)
Applied a 14 day Zio XT monitor to patient in the office ? ?Dr Skains to read ?

## 2022-12-28 NOTE — Patient Instructions (Addendum)
Medication Instructions:  1.Start metoprolol tartrate (Lopressor) 12.5 mg twice a day, this will be 1/2 of a 25 mg tablet twice daily *If you need a refill on your cardiac medications before your next appointment, please call your pharmacy*  Lab Work: None If you have labs (blood work) drawn today and your tests are completely normal, you will receive your results only by: [facility] (if you have MyChart) OR A paper copy in the mail If you have any lab test that is abnormal or we need to change your treatment, we will call you to review the results.  Follow-Up: At [facility], you and your health needs are our priority.  As part of our continuing mission to provide you with exceptional heart care, we have created designated Provider Care Teams.  These Care Teams include your primary Cardiologist (physician) and Advanced Practice Providers (APPs -  Physician Assistants and Nurse Practitioners) who all work together to provide you with the care you need, when you need it.   Your next appointment:   2-3 month(s)  Provider:   Candee Furbish, MD  or APP  Other Instructions 1.Weigh every morning after using the restroom, before breakfast and let us know if you have a weight gain or 2 or more lbs overnight or 5 or more lbs in a week 2.Increase hydration to at least 64 oz of water daily  Heart-Healthy Eating Plan Many factors influence your heart health, including eating and exercise habits. Heart health is also called coronary health. Coronary risk increases with abnormal blood fat (lipid) levels. A heart-healthy eating plan includes limiting unhealthy fats, increasing healthy fats, limiting salt (sodium) intake, and making other diet and lifestyle changes. What is my plan? Your health care provider may recommend that: You limit your fat intake to _________% or less of your total calories each day. You limit your saturated fat intake to _________% or less of your total calories  each day. You limit the amount of cholesterol in your diet to less than _________ mg per day. You limit the amount of sodium in your diet to less than _________ mg per day. What are tips for following this plan? Cooking Cook foods using methods other than frying. Baking, boiling, grilling, and broiling are all good options. Other ways to reduce fat include: Removing the skin from poultry. Removing all visible fats from meats. Steaming vegetables in water or broth. Meal planning  At meals, imagine dividing your plate into fourths: Fill one-half of your plate with vegetables and green salads. Fill one-fourth of your plate with whole grains. Fill one-fourth of your plate with lean protein foods. Eat 2-4 cups of vegetables per day. One cup of vegetables equals 1 cup (91 g) broccoli or cauliflower florets, 2 medium carrots, 1 large bell pepper, 1 large sweet potato, 1 large tomato, 1 medium white potato, 2 cups (150 g) raw leafy greens. Eat 1-2 cups of fruit per day. One cup of fruit equals 1 small apple, 1 large banana, 1 cup (237 g) mixed fruit, 1 large orange,  cup (82 g) dried fruit, 1 cup (240 mL) 100% fruit juice. Eat more foods that contain soluble fiber. Examples include apples, broccoli, carrots, beans, peas, and barley. Aim to get 25-30 g of fiber per day. Increase your consumption of legumes, nuts, and seeds to 4-5 servings per week. One serving of dried beans or legumes equals  cup (90 g) cooked, 1 serving of nuts is  oz (12 almonds, 24 pistachios, or  7 walnut halves), and 1 serving of seeds equals  oz (8 g). Fats Choose healthy fats more often. Choose monounsaturated and polyunsaturated fats, such as olive and canola oils, avocado oil, flaxseeds, walnuts, almonds, and seeds. Eat more omega-3 fats. Choose salmon, mackerel, sardines, tuna, flaxseed oil, and ground flaxseeds. Aim to eat fish at least 2 times each week. Check food labels carefully to identify foods with trans fats or  high amounts of saturated fat. Limit saturated fats. These are found in animal products, such as meats, butter, and cream. Plant sources of saturated fats include palm oil, palm kernel oil, and coconut oil. Avoid foods with partially hydrogenated oils in them. These contain trans fats. Examples are stick margarine, some tub margarines, cookies, crackers, and other baked goods. Avoid fried foods. General information Eat more home-cooked food and less restaurant, buffet, and fast food. Limit or avoid alcohol. Limit foods that are high in added sugar and simple starches such as foods made using white refined flour (white breads, pastries, sweets). Lose weight if you are overweight. Losing just 5-10% of your body weight can help your overall health and prevent diseases such as diabetes and heart disease. Monitor your sodium intake, especially if you have high blood pressure. Talk with your health care provider about your sodium intake. Try to incorporate more vegetarian meals weekly. What foods should I eat? Fruits All fresh, canned (in natural juice), or frozen fruits. Vegetables Fresh or frozen vegetables (raw, steamed, roasted, or grilled). Green salads. Grains Most grains. Choose whole wheat and whole grains most of the time. Rice and pasta, including brown rice and pastas made with whole wheat. Meats and other proteins Lean, well-trimmed beef, veal, pork, and lamb. Chicken and [place] without skin. All fish and shellfish. Wild duck, rabbit, pheasant, and venison. Egg whites or low-cholesterol egg substitutes. Dried beans, peas, lentils, and tofu. Seeds and most nuts. Dairy Low-fat or nonfat cheeses, including ricotta and mozzarella. Skim or 1% milk (liquid, powdered, or evaporated). Buttermilk made with low-fat milk. Nonfat or low-fat yogurt. Fats and oils Non-hydrogenated (trans-free) margarines. Vegetable oils, including soybean, sesame, sunflower, olive, avocado, peanut, safflower, corn,  canola, and cottonseed. Salad dressings or mayonnaise made with a vegetable oil. Beverages Water (mineral or sparkling). Coffee and tea. Unsweetened ice tea. Diet beverages. Sweets and desserts Sherbet, gelatin, and fruit ice. Small amounts of dark chocolate. Limit all sweets and desserts. Seasonings and condiments All seasonings and condiments. The items listed above may not be a complete list of foods and beverages you can eat. Contact a dietitian for more options. What foods should I avoid? Fruits Canned fruit in heavy syrup. Fruit in cream or butter sauce. Fried fruit. Limit coconut. Vegetables Vegetables cooked in cheese, cream, or butter sauce. Fried vegetables. Grains Breads made with saturated or trans fats, oils, or whole milk. Croissants. Sweet rolls. Donuts. High-fat crackers, such as cheese crackers and chips. Meats and other proteins Fatty meats, such as hot dogs, ribs, sausage, bacon, rib-eye roast or steak. High-fat deli meats, such as salami and bologna. Caviar. Domestic duck and goose. Organ meats, such as liver. Dairy Cream, sour cream, cream cheese, and creamed cottage cheese. Whole-milk cheeses. Whole or 2% milk (liquid, evaporated, or condensed). Whole buttermilk. Cream sauce or high-fat cheese sauce. Whole-milk yogurt. Fats and oils Meat fat, or shortening. Cocoa butter, hydrogenated oils, palm oil, coconut oil, palm kernel oil. Solid fats and shortenings, including bacon fat, salt pork, lard, and butter. Nondairy cream substitutes. Salad dressings with cheese or  sour cream. Beverages Regular sodas and any drinks with added sugar. Sweets and desserts Frosting. Pudding. Cookies. Cakes. Pies. Milk chocolate or white chocolate. Buttered syrups. Full-fat ice cream or ice cream drinks. The items listed above may not be a complete list of foods and beverages to avoid. Contact a dietitian for more information. Summary Heart-healthy meal planning includes limiting unhealthy  fats, increasing healthy fats, limiting salt (sodium) intake and making other diet and lifestyle changes. Lose weight if you are overweight. Losing just 5-10% of your body weight can help your overall health and prevent diseases such as diabetes and heart disease. Focus on eating a balance of foods, including fruits and vegetables, low-fat or nonfat dairy, lean protein, nuts and legumes, whole grains, and heart-healthy oils and fats. This information is not intended to replace advice given to you by your health care provider. Make sure you discuss any questions you have with your health care provider. Document Revised: 10/25/2021 Document Reviewed: 10/25/2021 Elsevier Patient Education  [place].  Postural Orthostatic Tachycardia Syndrome Postural orthostatic tachycardia syndrome (POTS) is a group of symptoms that occur along with an increase in heart rate when a person stands up after lying down. The symptoms include light-headedness or fainting, and they improve when the person lies back down. POTS may be associated with another medical condition, or it may occur on its own. What are the causes? The cause of this condition is not known, but many conditions and diseases are associated with it. What increases the risk? This condition is more likely to develop in: Women 9-76 years old. Women who are pregnant. Women who are in their period (menstruating). People who have certain conditions, such as: Infection from a virus. Diseases that cause the body's defense system (immune system) to attack healthy organs. These are called autoimmune diseases. Losing a lot of red blood cells (anemia). Losing too much water in the body (dehydration). An overactive thyroid (hyperthyroidism). People who take certain medicines. People who have had a major injury. People who have had surgery. What are the signs or symptoms? The most common symptom of this condition is light-headedness when you stand  up from a lying or sitting position. Other symptoms may include: Feeling a rapid increase in the heartbeat (tachycardia) within 10 minutes of standing up. Chest pain. Shortness of breath. Breathing that is deeper and faster than normal (hyperventilation). Fainting. Confusion. Trembling. Weakness. Headache. Anxiety. Nausea. Sweating or flushing. Symptoms may be worse in the morning, and they may be relieved by lying down. How is this diagnosed? This condition is diagnosed based on: Your symptoms. Your medical history. A physical exam. Checking your heart rate when you are lying down and after you stand up. Checking your blood pressure when you go from lying down to standing up. Blood and urine tests to measure hormones that change with blood pressure. The blood tests will be done when you are lying down and when you are standing up. You may have other tests to check for conditions or diseases that are associated with POTS. How is this treated? Treatment for this condition depends on how severe your symptoms are and whether you have any conditions or diseases that are associated with POTS. Treatment may involve: Treating any conditions or diseases that are associated with POTS. Drinking two glasses of water before getting up from a lying position. Increasing salt (sodium) in your diet. Taking medicine to control blood pressure and heart rate (beta-blocker). Avoiding certain medicines. Starting an exercise program under  the supervision of a health care provider. Follow these instructions at home: Medicines Take over-the-counter and prescription medicines only as told by your health care provider. Let your health care provider know about all prescription or over-the-counter medicines you take. These include herbs, vitamins, and supplements. You may need to stop or adjust some medicines if they cause this condition. Talk with your health care provider before starting any new  medicines. Eating and drinking  Drink enough fluid to keep your urine pale yellow. If told by your health care provider, drink two glasses of water before getting up from a lying position. Follow instructions from your health care provider about how much sodium you should include in your diet. Avoid heavy meals. Eat several small meals a day instead of a few large meals. General instructions Do an aerobic exercise for 20 minutes a day, at least 3 days a week. Aerobic exercises are those that cause your heart to beat faster. Ask your health care provider what kinds of exercise are safe for you. Do not use any products that contain nicotine or tobacco. These products include cigarettes, chewing tobacco, and vaping devices, such as e-cigarettes. These can interfere with blood flow. If you need help quitting, ask your health care provider. Keep all follow-up visits. This is important. Contact a health care provider if: Your symptoms do not improve after treatment. Your symptoms get worse. You develop new symptoms. Get help right away if: You have chest pain. You have difficulty breathing. You have fainting episodes. These symptoms may be an emergency. Get help right away. Call 911. Do not wait to see if the symptoms will go away. Do not drive yourself to the hospital. Summary POTS is a group of symptoms that occur along with an increase in heart rate when a person stands up after lying down. The most common symptom is light-headedness when you stand up. Treatment for this condition includes treating any underlying conditions, drinking plenty of water, stopping or changing some medicines, or starting an exercise program. Get help right away if you have chest pain, difficulty breathing, or fainting episodes. These symptoms may be an emergency. This information is not intended to replace advice given to you by your health care provider. Make sure you discuss any questions you have with your  health care provider. Document Revised: 04/01/2021 Document Reviewed: 04/01/2021 Elsevier Patient Education  [place] [employer] Monitor Instructions  Your physician has requested you wear a ZIO patch monitor for 14 days.  This is a single patch monitor. [employer] supplies one patch monitor per enrollment. Additional stickers are not available. Please do not apply patch if you will be having a Nuclear Stress Test,  Echocardiogram, Cardiac CT, MRI, or Chest Xray during the period you would be wearing the  monitor. The patch cannot be worn during these tests. You cannot remove and re-apply the  ZIO XT patch monitor.  Your ZIO patch monitor will be mailed 3 day USPS to your address on file. It may take 3-5 days  to receive your monitor after you have been enrolled.  Once you have received your monitor, please review the enclosed instructions. Your monitor  has already been registered assigning a specific monitor serial # to you.  Billing and Patient Assistance Program Information  We have supplied [employer] with any of your insurance information on file for billing purposes. [employer] offers a sliding scale Patient Assistance Program for patients that do not have  insurance,  or whose insurance does not completely cover the cost of the ZIO monitor.  You must apply for the Patient Assistance Program to qualify for this discounted rate.  To apply, please call [employer] at [phone], select option 4, select option 2, ask to apply for  Patient Assistance Program. Theodore Demark will ask your household income, and how many people  are in your household. They will quote your out-of-pocket cost based on that information.  [employer] will also be able to set up a 31-month, interest-free payment plan if needed.  Applying the monitor   Shave hair from upper left chest.  Hold abrader disc by orange tab. Rub abrader in 40 strokes over the upper left chest as  indicated in your monitor  instructions.  Clean area with 4 enclosed alcohol pads. Let dry.  Apply patch as indicated in monitor instructions. Patch will be placed under collarbone on left  side of chest with arrow pointing upward.  Rub patch adhesive wings for 2 minutes. Remove white label marked "1". Remove the white  label marked "2". Rub patch adhesive wings for 2 additional minutes.  While looking in a mirror, press and release button in center of patch. A small green light will  flash 3-4 times. This will be your only indicator that the monitor has been turned on.  Do not shower for the first 24 hours. You may shower after the first 24 hours.  Press the button if you feel a symptom. You will hear a small click. Record Date, Time and  Symptom in the Patient Logbook.  When you are ready to remove the patch, follow instructions on the last 2 pages of Patient  Logbook. Stick patch monitor onto the last page of Patient Logbook.  Place Patient Logbook in the blue and white box. Use locking tab on box and tape box closed  securely. The blue and white box has prepaid postage on it. Please place it in the mailbox as  soon as possible. Your physician should have your test results approximately 7 days after the  monitor has been mailed back to [facility].  Call [facility] [place] at [phone] if you have questions regarding  your ZIO XT patch monitor. Call them immediately if you see an orange light blinking on your  monitor.  If your monitor falls off in less than 4 days, contact our Monitor department at [phone].  If your monitor becomes loose or falls off after 4 days call [employer] at [phone] for  suggestions on securing your monitor

## 2023-01-05 ENCOUNTER — Inpatient Hospital Stay: Payer: Medicare Other | Attending: Internal Medicine | Admitting: Internal Medicine

## 2023-01-05 ENCOUNTER — Inpatient Hospital Stay: Payer: Medicare Other

## 2023-01-05 ENCOUNTER — Other Ambulatory Visit: Payer: Self-pay | Admitting: Internal Medicine

## 2023-01-05 ENCOUNTER — Other Ambulatory Visit: Payer: Self-pay | Admitting: Medical Oncology

## 2023-01-05 VITALS — BP 150/70 | HR 95 | Temp 98.2°F | Resp 16 | Wt 237.1 lb

## 2023-01-05 DIAGNOSIS — Z7984 Long term (current) use of oral hypoglycemic drugs: Secondary | ICD-10-CM | POA: Insufficient documentation

## 2023-01-05 DIAGNOSIS — D539 Nutritional anemia, unspecified: Secondary | ICD-10-CM

## 2023-01-05 DIAGNOSIS — Z79899 Other long term (current) drug therapy: Secondary | ICD-10-CM | POA: Diagnosis not present

## 2023-01-05 DIAGNOSIS — D649 Anemia, unspecified: Secondary | ICD-10-CM

## 2023-01-05 DIAGNOSIS — I10 Essential (primary) hypertension: Secondary | ICD-10-CM | POA: Insufficient documentation

## 2023-01-05 DIAGNOSIS — Z803 Family history of malignant neoplasm of breast: Secondary | ICD-10-CM | POA: Insufficient documentation

## 2023-01-05 DIAGNOSIS — Z7901 Long term (current) use of anticoagulants: Secondary | ICD-10-CM | POA: Diagnosis not present

## 2023-01-05 DIAGNOSIS — I480 Paroxysmal atrial fibrillation: Secondary | ICD-10-CM | POA: Diagnosis not present

## 2023-01-05 DIAGNOSIS — D500 Iron deficiency anemia secondary to blood loss (chronic): Secondary | ICD-10-CM | POA: Diagnosis not present

## 2023-01-05 DIAGNOSIS — E119 Type 2 diabetes mellitus without complications: Secondary | ICD-10-CM | POA: Diagnosis not present

## 2023-01-05 LAB — CBC WITH DIFFERENTIAL (CANCER CENTER ONLY)
Abs Immature Granulocytes: 0.01 10*3/uL (ref 0.00–0.07)
Basophils Absolute: 0 10*3/uL (ref 0.0–0.1)
Basophils Relative: 1 %
Eosinophils Absolute: 0.2 10*3/uL (ref 0.0–0.5)
Eosinophils Relative: 3 %
HCT: 33.4 % — ABNORMAL LOW (ref 36.0–46.0)
Hemoglobin: 10.1 g/dL — ABNORMAL LOW (ref 12.0–15.0)
Immature Granulocytes: 0 %
Lymphocytes Relative: 22 %
Lymphs Abs: 1.3 10*3/uL (ref 0.7–4.0)
MCH: 25.2 pg — ABNORMAL LOW (ref 26.0–34.0)
MCHC: 30.2 g/dL (ref 30.0–36.0)
MCV: 83.3 fL (ref 80.0–100.0)
Monocytes Absolute: 0.4 10*3/uL (ref 0.1–1.0)
Monocytes Relative: 6 %
Neutro Abs: 4.1 10*3/uL (ref 1.7–7.7)
Neutrophils Relative %: 68 %
Platelet Count: 265 10*3/uL (ref 150–400)
RBC: 4.01 MIL/uL (ref 3.87–5.11)
RDW: 17.4 % — ABNORMAL HIGH (ref 11.5–15.5)
WBC Count: 6 10*3/uL (ref 4.0–10.5)
nRBC: 0 % (ref 0.0–0.2)

## 2023-01-05 LAB — IRON AND IRON BINDING CAPACITY (CC-WL,HP ONLY)
Iron: 177 ug/dL — ABNORMAL HIGH (ref 28–170)
Saturation Ratios: 43 % — ABNORMAL HIGH (ref 10.4–31.8)
TIBC: 410 ug/dL (ref 250–450)
UIBC: 233 ug/dL (ref 148–442)

## 2023-01-05 LAB — CMP (CANCER CENTER ONLY)
ALT: 33 U/L (ref 0–44)
AST: 28 U/L (ref 15–41)
Albumin: 4 g/dL (ref 3.5–5.0)
Alkaline Phosphatase: 88 U/L (ref 38–126)
Anion gap: 12 (ref 5–15)
BUN: 15 mg/dL (ref 8–23)
CO2: 26 mmol/L (ref 22–32)
Calcium: 9.7 mg/dL (ref 8.9–10.3)
Chloride: 101 mmol/L (ref 98–111)
Creatinine: 0.87 mg/dL (ref 0.44–1.00)
GFR, Estimated: >60 mL/min (ref >60)
Glucose, Bld: 203 mg/dL — ABNORMAL HIGH (ref 70–99)
Potassium: 3.4 mmol/L — ABNORMAL LOW (ref 3.5–5.1)
Sodium: 139 mmol/L (ref 135–145)
Total Bilirubin: 0.3 mg/dL (ref 0.3–1.2)
Total Protein: 6.8 g/dL (ref 6.5–8.1)

## 2023-01-05 LAB — VITAMIN B12: Vitamin B-12: 619 pg/mL (ref 180–914)

## 2023-01-05 LAB — TSH: TSH: 1.273 u[IU]/mL (ref 0.350–4.500)

## 2023-01-05 LAB — LACTATE DEHYDROGENASE: LDH: 142 U/L (ref 98–192)

## 2023-01-05 LAB — FOLATE: Folate: 18.6 ng/mL (ref >5.9)

## 2023-01-05 LAB — SAMPLE TO BLOOD BANK

## 2023-01-05 LAB — FERRITIN: Ferritin: 8 ng/mL — ABNORMAL LOW (ref 11–307)

## 2023-01-05 NOTE — Progress Notes (Signed)
[facility] [facility] [phone]   [phone]  CONSULT NOTE  REFERRING PHYSICIAN: Dr. Amilliana Ada  REASON FOR CONSULTATION:  72 years old white female with persistent anemia  HPI Lindsey Jordan is a 72 y.o. female with past medical history significant for type 2 diabetes mellitus, hypertension, kidney stone, paroxysmal atrial fibrillation and coronary artery disease.  The patient mentions that she has been complaining of increasing fatigue and weakness for the last 2 years.  She has been working in home [place] and lifting a lot of furniture in the past.  She had several viral infection over the last year in February, June and August 2023 and has been feeling more tired and fatigued since that time.  She also take care of 2 grandchildren.  She also was diagnosed with COVID-19 in February 2024.  During her hospitalization she had EGD as well as a capsule endoscopy later on for persistent anemia where her hemoglobin was down to 7.1 on December 02, 2022.  She received PRBCs transfusion during her admission.  Her fecal occult test was positive.  Her capsule endoscopy showed several areas of nonbleeding AVMs.  She had colonoscopy 2 years ago that was unremarkable.  The patient started taking over-the-counter ferrous sulfate as well as vitamin B12 recently.  Her last blood work by her primary care physician showed hemoglobin of 10.2. She was referred to me today for evaluation and recommendation regarding her persistent anemia. When seen today she continues to complain of increasing fatigue and weakness as well as constipation but no significant nausea, vomiting or diarrhea.  She has lack of energy and occasional dizzy spells.  She also has shortness of breath with exertion.  She is currently on heart monitor for the atrial fibrillation.  She is currently on treatment with Xarelto for her atrial fibrillation as well as metoprolol and Repatha.  She denied having any  chest pain, cough or hemoptysis.  She has no headache or visual changes.  She has no significant bleeding issues. Family history significant for father with hypertension and heart disease.  Maternal aunt had breast cancer.  Maternal grandfather had leukemia and her mother died from complication of flu. The patient is married and has 2 children 1 biological son and 1 adopted daughter.  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  She has no history for smoking, alcohol or drug abuse. HPI  Past Medical History:  Diagnosis Date   Diabetes mellitus without complication ([place])    Hypertension    Kidney stones    Morbid obesity ([place])    Paroxysmal atrial fibrillation ([facility])    PONV (postoperative nausea and vomiting)     Past Surgical History:  Procedure Laterality Date   ABDOMINAL HYSTERECTOMY  2003   ADENOIDECTOMY     age 17   ATRIAL FIBRILLATION ABLATION N/A 04/06/2021   Procedure: ATRIAL FIBRILLATION ABLATION;  Surgeon: Thompson Grayer, MD;  Location: [facility];  Service: Cardiovascular;  Laterality: N/A;   BREAST LUMPECTOMY     x 2 in her 20's   BREAST SURGERY     CESAREAN SECTION     one previous   ECTOPIC PREGNANCY SURGERY     ESOPHAGOGASTRODUODENOSCOPY (EGD) WITH PROPOFOL N/A 12/03/2022   Procedure: ESOPHAGOGASTRODUODENOSCOPY (EGD) WITH PROPOFOL;  Surgeon: Wilford Corner, MD;  Location: [facility];  Service: Gastroenterology;  Laterality: N/A;   GIVENS CAPSULE STUDY N/A 12/03/2022   Procedure: GIVENS CAPSULE STUDY;  Surgeon: Wilford Corner, MD;  Location: [facility];  Service: Gastroenterology;  Laterality: N/A;   HEMORRHOID SURGERY     in 20's   LEFT HEART CATH AND CORONARY ANGIOGRAPHY N/A 11/23/2020   Procedure: LEFT HEART CATH AND CORONARY ANGIOGRAPHY;  Surgeon: Belva Crome, MD;  Location: [facility];  Service: Cardiovascular;  Laterality: N/A;   TEE WITHOUT CARDIOVERSION N/A 04/06/2021   Procedure: TRANSESOPHAGEAL ECHOCARDIOGRAM (TEE);  Surgeon: Geralynn Rile, MD;   Location: [facility];  Service: Cardiovascular;  Laterality: N/A;   TONSILLECTOMY      Family History  Problem Relation Age of Onset   Hypertension Father    Breast cancer Maternal Aunt     Social History Social History   Tobacco Use   Smoking status: Never   Smokeless tobacco: Never  Substance Use Topics   Alcohol use: No   Drug use: No    Allergies  Allergen Reactions   Clarithromycin Other (See Comments)    Extreme stomach discomfort  Other Reaction(s): Not available, stomach pain   Other Other (See Comments)   Statins     Severe muscle aches with Lipitor (1998) and Crestor (2008)   Welchol [Colesevelam Hcl]     Side effects - muscle and GI   Welchol [Colesevelam] Other (See Comments)    Current Outpatient Medications  Medication Sig Dispense Refill   acetaminophen (TYLENOL) 500 MG tablet Take 1,000 mg by mouth at bedtime.     acyclovir (ZOVIRAX) 200 MG capsule Take 200 mg by mouth 3 (three) times daily as needed (outbreaks).     amitriptyline (ELAVIL) 10 MG tablet Take 5 mg by mouth at bedtime.     clonazePAM (KLONOPIN) 1 MG tablet Take 1 mg by mouth at bedtime.     diltiazem (CARDIZEM CD) 240 MG 24 hr capsule Take 240 mg by mouth daily.     escitalopram (LEXAPRO) 10 MG tablet Take 10 mg by mouth at bedtime.     Evolocumab (REPATHA SURECLICK) [id number] MG/ML SOAJ INJECT 1 PEN INTO THE SKIN EVERY 14 (FOURTEEN) DAYS. 6 mL 3   fluticasone (FLONASE) 50 MCG/ACT nasal spray Place 2 sprays into both nostrils daily.     guaiFENesin-dextromethorphan (ROBITUSSIN DM) 100-10 MG/5ML syrup Take 5 mLs by mouth every 4 (four) hours as needed for cough (chest congestion). (Patient not taking: Reported on 12/28/2022) 118 mL 0   metFORMIN (GLUCOPHAGE-XR) 500 MG 24 hr tablet Take 500 mg by mouth at bedtime.     metoprolol tartrate (LOPRESSOR) 25 MG tablet Take 0.5 tablets (12.5 mg total) by mouth 2 (two) times daily. 90 tablet 3   nitroGLYCERIN (NITROSTAT) 0.4 MG SL tablet PLACE 1 TABLET  UNDER THE TONGUE EVERY 5 MINUTES X 3 DOSES AS NEEDED FOR CHEST PAIN. 25 tablet 2   rivaroxaban (XARELTO) 20 MG TABS tablet Take 20 mg by mouth daily.     triamterene-hydrochlorothiazide (MAXZIDE-25) 37.5-25 MG tablet Take 1 tablet by mouth daily.     No current facility-administered medications for this visit.    Review of Systems  Constitutional: positive for fatigue Eyes: negative Ears, nose, mouth, throat, and face: negative Respiratory: positive for dyspnea on exertion Cardiovascular: negative Gastrointestinal: positive for constipation Genitourinary:negative Integument/breast: negative Hematologic/lymphatic: negative Musculoskeletal:positive for arthralgias and muscle weakness Neurological: positive for dizziness Behavioral/Psych: negative Endocrine: negative Allergic/Immunologic: negative  Physical Exam  [id number], healthy, no distress, well nourished, and well developed SKIN: skin color, texture, turgor are normal, no rashes or significant lesions HEAD: Normocephalic, No masses, lesions, tenderness or abnormalities EYES: normal, PERRLA, Conjunctiva are  pink and non-injected EARS: External ears normal, Canals clear OROPHARYNX:no exudate, no erythema, and lips, buccal mucosa, and tongue normal  NECK: supple, no adenopathy, no JVD LYMPH:  no palpable lymphadenopathy, no hepatosplenomegaly BREAST:not examined LUNGS: clear to auscultation , and palpation HEART: regular rate & rhythm, no murmurs, and no gallops ABDOMEN:abdomen soft, non-tender, normal bowel sounds, and no masses or organomegaly BACK: Back symmetric, no curvature., No CVA tenderness EXTREMITIES:no joint deformities, effusion, or inflammation, no edema  NEURO: alert & oriented x 3 with fluent speech, no focal motor/sensory deficits  PERFORMANCE STATUS: ECOG 1  LABORATORY DATA: Lab Results  Component Value Date   WBC 5.6 12/02/2022   HGB 7.9 (L) 12/04/2022   HCT 26.0 (L) 12/04/2022   MCV 89.5  12/02/2022   PLT 192 12/02/2022      Chemistry      Component Value Date/Time   NA 137 12/02/2022 0628   NA 140 11/30/2022 1134   K 3.9 12/02/2022 0628   CL 104 12/02/2022 0628   CO2 25 12/02/2022 0628   BUN 14 12/02/2022 0628   BUN 14 11/30/2022 1134   CREATININE 0.65 12/02/2022 0628      Component Value Date/Time   CALCIUM 8.6 (L) 12/02/2022 0628   ALKPHOS 71 12/01/2022 1645   AST 19 12/01/2022 1645   ALT 22 12/01/2022 1645   BILITOT 0.3 12/01/2022 1645   BILITOT 0.3 11/30/2022 1134       RADIOGRAPHIC STUDIES: ECHOCARDIOGRAM COMPLETE  Result Date: 12/23/2022    ECHOCARDIOGRAM REPORT   Patient Name:   Lindsey Jordan Date of Exam: 12/22/2022 Medical Rec #:  [id number]        Height:       67.0 in Accession #:    [id number]       Weight:       244.9 lb Date of Birth:  1951-06-01        BSA:          2.204 m Patient Age:    46 years         BP:           126/64 mmHg Patient Gender: F                HR:           87 bpm. Exam Location:  Outpatient Procedure: 2D Echo, 3D Echo, Color Doppler, Cardiac Doppler and Strain Analysis Indications:    Atrial Fibrillation  History:        Patient has prior history of Echocardiogram examinations, most                 recent 04/06/2021. Arrythmias:Atrial Fibrillation; Risk                 Factors:Diabetes, Dyslipidemia and Non-Smoker.  Sonographer:    Leavy Cella RDCS Referring Phys: [place]  1. Left ventricular ejection fraction, by estimation, is 55 to 60%. Left ventricular ejection fraction by 3D volume is 55 %. The left ventricle has normal function. The left ventricle has no regional wall motion abnormalities. There is mild left ventricular hypertrophy. Left ventricular diastolic parameters are indeterminate.  2. Right ventricular systolic function is mildly reduced. The right ventricular size is normal. There is normal pulmonary artery systolic pressure. The estimated right ventricular systolic pressure is 123456  mmHg.  3. The mitral valve is grossly normal. Trivial mitral valve regurgitation. No evidence of mitral stenosis.  4. The aortic valve is tricuspid.  There is mild calcification of the aortic valve. There is mild thickening of the aortic valve. Aortic valve regurgitation is not visualized. Aortic valve sclerosis/calcification is present, without any evidence of aortic stenosis.  5. The inferior vena cava is normal in size with greater than 50% respiratory variability, suggesting right atrial pressure of 3 mmHg. FINDINGS  Left Ventricle: Left ventricular ejection fraction, by estimation, is 55 to 60%. Left ventricular ejection fraction by 3D volume is 55 %. The left ventricle has normal function. The left ventricle has no regional wall motion abnormalities. Global longitudinal strain performed but not reported based on interpreter judgement due to suboptimal tracking. The left ventricular internal cavity size was normal in size. There is mild left ventricular hypertrophy. Left ventricular diastolic parameters are indeterminate. Right Ventricle: The right ventricular size is normal. No increase in right ventricular wall thickness. Right ventricular systolic function is mildly reduced. There is normal pulmonary artery systolic pressure. The tricuspid regurgitant velocity is 2.45 m/s, and with an assumed right atrial pressure of 3 mmHg, the estimated right ventricular systolic pressure is 123456 mmHg. Left Atrium: Left atrial size was normal in size. Right Atrium: Right atrial size was normal in size. Pericardium: There is no evidence of pericardial effusion. Mitral Valve: The mitral valve is grossly normal. Trivial mitral valve regurgitation. No evidence of mitral valve stenosis. Tricuspid Valve: The tricuspid valve is normal in structure. Tricuspid valve regurgitation is trivial. No evidence of tricuspid stenosis. Aortic Valve: The aortic valve is tricuspid. There is mild calcification of the aortic valve. There is mild  thickening of the aortic valve. Aortic valve regurgitation is not visualized. Aortic valve sclerosis/calcification is present, without any evidence of aortic stenosis. Pulmonic Valve: The pulmonic valve was normal in structure. Pulmonic valve regurgitation is trivial. No evidence of pulmonic stenosis. Aorta: The aortic root is normal in size and structure. Venous: The inferior vena cava is normal in size with greater than 50% respiratory variability, suggesting right atrial pressure of 3 mmHg. IAS/Shunts: No atrial level shunt detected by color flow Doppler. Additional Comments: There is a small pleural effusion in the left lateral region.  LEFT VENTRICLE PLAX 2D LVIDd:         3.76 cm         Diastology LVIDs:         2.73 cm         LV e' medial:    4.68 cm/s LV PW:         1.10 cm         LV E/e' medial:  13.2 LV IVS:        1.10 cm         LV e' lateral:   8.38 cm/s LVOT diam:     2.10 cm         LV E/e' lateral: 7.4 LV SV:         54 LV SV Index:   24 LVOT Area:     3.46 cm        3D Volume EF                                LV 3D EF:    Left  ventricul                                             ar                                             ejection                                             fraction                                             by 3D                                             volume is                                             55 %.                                 3D Volume EF:                                3D EF:        55 %                                LV EDV:       90 ml                                LV ESV:       41 ml                                LV SV:        50 ml RIGHT VENTRICLE RV Basal diam:  4.19 cm RV Mid diam:    3.34 cm RV S prime:     15.70 cm/s TAPSE (M-mode): 2.3 cm LEFT ATRIUM           Index        RIGHT ATRIUM           Index LA diam:      4.60 cm 2.09 cm/m   RA Area:     13.90 cm LA Vol (A2C): 20.9 ml 9.48 ml/m   RA  Volume:   36.70 ml  16.65 ml/m LA Vol (A4C): 73.1 ml 33.16 ml/m  AORTIC VALVE LVOT Vmax:   84.40 cm/s LVOT Vmean:  54.000 cm/s LVOT VTI:  0.155 m  AORTA Ao Root diam: 2.70 cm Ao Asc diam:  3.40 cm MITRAL VALVE               TRICUSPID VALVE MV Area (PHT): 4.31 cm    TR Peak grad:   24.0 mmHg MV Decel Time: 176 msec    TR Vmax:        245.00 cm/s MR Peak grad: 28.1 mmHg MR Vmax:      265.00 cm/s  SHUNTS MV E velocity: 61.80 cm/s  Systemic VTI:  0.16 m MV A velocity: 66.00 cm/s  Systemic Diam: 2.10 cm MV E/A ratio:  0.94 Cherlynn Kaiser MD Electronically signed by Cherlynn Kaiser MD Signature Date/Time: 12/23/2022/10:33:31 AM    Final     ASSESSMENT: This is a very pleasant 72 years old white female with persistent anemia of unclear etiology unlikely to be secondary to iron deficiency from 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  The patient is also on anticoagulation with Xarelto and Repatha which may have contributed more to her gastrointestinal blood loss. She started taking oral iron tablet and vitamin B12 for recently.  PLAN: I had a lengthy discussion with the patient about her current condition and further investigation to confirm her diagnosis.  I ordered several studies including repeat CBC that showed hemoglobin of 10.1 and hematocrit 33.4%.  MCV was normal at 83.3.  Iron study showed serum iron of 177 with iron saturation of 43%.  This is improving on the current oral iron tablets.  Ferritin level is still pending.  LDH is normal.  Other studies including vitamin B12, TSH, serum protein electrophoresis, blood for heavy metals as well as serum folate and erythropoietin's are still pending. I recommended for the patient to continue on the oral iron supplements with vitamin C or orange juice in addition to vitamin B12 supplements. I will see her back for follow-up visit in 2 months for evaluation and repeat blood work.  If she continues to have further  decline in her hemoglobin or any other cytopenia, I may consider her for a bone marrow biopsy and aspirate but this is not needed at this point. The patient was advised to call immediately if she has any other concerning symptoms in the interval.  The patient voices understanding of current disease status and treatment options and is in agreement with the current care plan.  All questions were answered. The patient knows to call the clinic with any problems, questions or concerns. We can certainly see the patient much sooner if necessary.  Thank you so much for allowing me to participate in the care of Bishop. I will continue to follow up the patient with you and assist in her care. The total time spent in the appointment was 60 minutes.  Disclaimer: This note was dictated with voice recognition software. Similar sounding words can inadvertently be transcribed and may not be corrected upon review.   Eilleen Kempf January 05, 2023, 11:49 AM

## 2023-01-06 ENCOUNTER — Encounter: Payer: Self-pay | Admitting: Internal Medicine

## 2023-01-06 ENCOUNTER — Other Ambulatory Visit: Payer: Self-pay | Admitting: Physician Assistant

## 2023-01-06 ENCOUNTER — Other Ambulatory Visit: Payer: Self-pay | Admitting: Internal Medicine

## 2023-01-06 LAB — ERYTHROPOIETIN: Erythropoietin: 74.8 m[IU]/mL — ABNORMAL HIGH (ref 2.6–18.5)

## 2023-01-07 LAB — HEAVY METALS, BLOOD
Arsenic: 2 ug/L (ref 0–9)
Lead: <1 ug/dL (ref 0.0–3.4)
Mercury: <1 ug/L (ref 0.0–14.9)

## 2023-01-09 ENCOUNTER — Other Ambulatory Visit: Payer: Self-pay | Admitting: Internal Medicine

## 2023-01-09 ENCOUNTER — Telehealth: Payer: Self-pay

## 2023-01-09 LAB — PROTEIN ELECTROPHORESIS, SERUM, WITH REFLEX
A/G Ratio: 1.4 (ref 0.7–1.7)
Albumin ELP: 3.7 g/dL (ref 2.9–4.4)
Alpha-1-Globulin: 0.2 g/dL (ref 0.0–0.4)
Alpha-2-Globulin: 0.8 g/dL (ref 0.4–1.0)
Beta Globulin: 1.1 g/dL (ref 0.7–1.3)
Gamma Globulin: 0.7 g/dL (ref 0.4–1.8)
Globulin, Total: 2.7 g/dL (ref 2.2–3.9)
Total Protein ELP: 6.4 g/dL (ref 6.0–8.5)

## 2023-01-09 NOTE — Telephone Encounter (Addendum)
Auth Submission: NO AUTH NEEDED Site of care: Site of care: [facility] Payer: [employer] Medication & CPT/J Code(s) submitted: Venofer (Iron Sucrose) J1756 Route of submission (phone, fax, portal):   Phone # Fax # Auth type: Buy/Bill Units/visits requested: 3 Reference number:   Approval from: 01/09/2023 to 05/11/2023

## 2023-01-12 ENCOUNTER — Ambulatory Visit (INDEPENDENT_AMBULATORY_CARE_PROVIDER_SITE_OTHER): Payer: Medicare Other

## 2023-01-12 VITALS — BP 131/76 | HR 62 | Temp 98.5°F | Resp 20 | Ht 67.0 in | Wt 238.2 lb

## 2023-01-12 DIAGNOSIS — D649 Anemia, unspecified: Secondary | ICD-10-CM

## 2023-01-12 MED ORDER — DIPHENHYDRAMINE HCL 25 MG PO CAPS
25.0000 mg | ORAL_CAPSULE | Freq: Once | ORAL | Status: AC
  Administered 2023-01-12: 25 mg via ORAL
  Filled 2023-01-12: qty 1

## 2023-01-12 MED ORDER — SODIUM CHLORIDE 0.9 % IV SOLN
300.0000 mg | Freq: Once | INTRAVENOUS | Status: AC
  Administered 2023-01-12: 300 mg via INTRAVENOUS
  Filled 2023-01-12: qty 15

## 2023-01-12 MED ORDER — ACETAMINOPHEN 325 MG PO TABS
650.0000 mg | ORAL_TABLET | Freq: Once | ORAL | Status: AC
  Administered 2023-01-12: 650 mg via ORAL
  Filled 2023-01-12: qty 2

## 2023-01-12 NOTE — Progress Notes (Signed)
Diagnosis: Iron Deficiency Anemia  Provider:  Chilton Greathouse MD  Procedure: Infusion  IV Type: Peripheral, IV Location: R Hand  Venofer (Iron Sucrose), Dose: 300 mg  Infusion Start Time: 1150  Infusion Stop Time: 1325  Post Infusion IV Care: Observation period completed PIV d/ced Discharge: Condition: Good, Destination: Home . AVS Declined  Performed by:  Marlow Baars [place], RN

## 2023-01-17 DIAGNOSIS — G90A Postural orthostatic tachycardia syndrome (POTS): Secondary | ICD-10-CM | POA: Diagnosis not present

## 2023-01-18 DIAGNOSIS — K080 Exfoliation of teeth due to systemic causes: Secondary | ICD-10-CM | POA: Diagnosis not present

## 2023-01-19 ENCOUNTER — Ambulatory Visit (INDEPENDENT_AMBULATORY_CARE_PROVIDER_SITE_OTHER): Payer: Medicare Other

## 2023-01-19 VITALS — BP 104/66 | HR 83 | Temp 97.9°F | Resp 20 | Ht 67.0 in | Wt 236.2 lb

## 2023-01-19 DIAGNOSIS — D649 Anemia, unspecified: Secondary | ICD-10-CM

## 2023-01-19 MED ORDER — ACETAMINOPHEN 325 MG PO TABS
650.0000 mg | ORAL_TABLET | Freq: Once | ORAL | Status: AC
  Administered 2023-01-19: 650 mg via ORAL
  Filled 2023-01-19: qty 2

## 2023-01-19 MED ORDER — SODIUM CHLORIDE 0.9 % IV SOLN
300.0000 mg | Freq: Once | INTRAVENOUS | Status: AC
  Administered 2023-01-19: 300 mg via INTRAVENOUS
  Filled 2023-01-19: qty 15

## 2023-01-19 MED ORDER — DIPHENHYDRAMINE HCL 25 MG PO CAPS
25.0000 mg | ORAL_CAPSULE | Freq: Once | ORAL | Status: AC
  Administered 2023-01-19: 25 mg via ORAL
  Filled 2023-01-19: qty 1

## 2023-01-19 NOTE — Progress Notes (Signed)
Diagnosis: Iron Deficiency Anemia  Provider:  Chilton Greathouse MD  Procedure: Infusion  IV Type: Peripheral, IV Location: R Hand  Venofer (Iron Sucrose), Dose: 300 mg  Infusion Start Time: 1402  Infusion Stop Time: 1541  Post Infusion IV Care: Peripheral IV Discontinued  Discharge: Condition: Good, Destination: Home . AVS Declined  Performed by:  Marlow Baars [place], RN

## 2023-01-26 ENCOUNTER — Ambulatory Visit (INDEPENDENT_AMBULATORY_CARE_PROVIDER_SITE_OTHER): Payer: Medicare Other | Admitting: *Deleted

## 2023-01-26 VITALS — BP 135/68 | HR 86 | Temp 97.8°F | Resp 18 | Ht 67.0 in | Wt 236.0 lb

## 2023-01-26 DIAGNOSIS — D649 Anemia, unspecified: Secondary | ICD-10-CM | POA: Diagnosis not present

## 2023-01-26 MED ORDER — ACETAMINOPHEN 325 MG PO TABS: 650.0000 mg | ORAL_TABLET | Freq: Once | ORAL | Status: DC

## 2023-01-26 MED ORDER — SODIUM CHLORIDE 0.9 % IV SOLN
300.0000 mg | Freq: Once | INTRAVENOUS | Status: AC
  Administered 2023-01-26: 300 mg via INTRAVENOUS
  Filled 2023-01-26: qty 15

## 2023-01-26 MED ORDER — DIPHENHYDRAMINE HCL 25 MG PO CAPS: 25.0000 mg | ORAL_CAPSULE | Freq: Once | ORAL | Status: DC

## 2023-01-26 NOTE — Progress Notes (Signed)
Diagnosis: Iron Deficiency Anemia  Provider:  Chilton Greathouse MD  Procedure: IV Infusion  IV Type: Peripheral, IV Location: R Forearm  Venofer (Iron Sucrose), Dose: 300 mg  Infusion Start Time: 1114 am  Infusion Stop Time: 1315 pm  Post Infusion IV Care: Observation period completed and Peripheral IV Discontinued  Discharge: Condition: Good, Destination: Home . AVS Declined  Performed by:  Forrest Moron, RN

## 2023-03-13 ENCOUNTER — Other Ambulatory Visit: Payer: Self-pay

## 2023-03-13 ENCOUNTER — Inpatient Hospital Stay: Payer: Medicare Other | Admitting: Internal Medicine

## 2023-03-13 ENCOUNTER — Inpatient Hospital Stay: Payer: Medicare Other | Attending: Internal Medicine

## 2023-03-13 VITALS — BP 139/56 | HR 79 | Temp 97.7°F | Resp 16 | Ht 67.0 in | Wt 231.5 lb

## 2023-03-13 DIAGNOSIS — R109 Unspecified abdominal pain: Secondary | ICD-10-CM | POA: Diagnosis not present

## 2023-03-13 DIAGNOSIS — D649 Anemia, unspecified: Secondary | ICD-10-CM | POA: Insufficient documentation

## 2023-03-13 DIAGNOSIS — Z79899 Other long term (current) drug therapy: Secondary | ICD-10-CM | POA: Insufficient documentation

## 2023-03-13 DIAGNOSIS — Z7901 Long term (current) use of anticoagulants: Secondary | ICD-10-CM | POA: Insufficient documentation

## 2023-03-13 DIAGNOSIS — D500 Iron deficiency anemia secondary to blood loss (chronic): Secondary | ICD-10-CM

## 2023-03-13 LAB — CBC WITH DIFFERENTIAL (CANCER CENTER ONLY)
Abs Immature Granulocytes: 0.02 10*3/uL (ref 0.00–0.07)
Basophils Absolute: 0 10*3/uL (ref 0.0–0.1)
Basophils Relative: 1 %
Eosinophils Absolute: 0.2 10*3/uL (ref 0.0–0.5)
Eosinophils Relative: 4 %
HCT: 40.8 % (ref 36.0–46.0)
Hemoglobin: 12.8 g/dL (ref 12.0–15.0)
Immature Granulocytes: 0 %
Lymphocytes Relative: 21 %
Lymphs Abs: 1.4 10*3/uL (ref 0.7–4.0)
MCH: 27.1 pg (ref 26.0–34.0)
MCHC: 31.4 g/dL (ref 30.0–36.0)
MCV: 86.3 fL (ref 80.0–100.0)
Monocytes Absolute: 0.6 10*3/uL (ref 0.1–1.0)
Monocytes Relative: 8 %
Neutro Abs: 4.4 10*3/uL (ref 1.7–7.7)
Neutrophils Relative %: 66 %
Platelet Count: 240 10*3/uL (ref 150–400)
RBC: 4.73 MIL/uL (ref 3.87–5.11)
RDW: 18.1 % — ABNORMAL HIGH (ref 11.5–15.5)
WBC Count: 6.6 10*3/uL (ref 4.0–10.5)
nRBC: 0 % (ref 0.0–0.2)

## 2023-03-13 LAB — IRON AND IRON BINDING CAPACITY (CC-WL,HP ONLY)
Iron: 33 ug/dL (ref 28–170)
Saturation Ratios: 10 % — ABNORMAL LOW (ref 10.4–31.8)
TIBC: 342 ug/dL (ref 250–450)
UIBC: 309 ug/dL (ref 148–442)

## 2023-03-13 LAB — FERRITIN: Ferritin: 43 ng/mL (ref 11–307)

## 2023-03-13 NOTE — Progress Notes (Signed)
[facility] [phone]   [phone]  OFFICE PROGRESS NOTE  Merri Brunette, MD [phone] 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  The patient is also on anticoagulation with Xarelto and Repatha which may have contributed more to her gastrointestinal blood loss.  PRIOR THERAPY: None  CURRENT THERAPY: Over the counter oral iron tablet with vitamin C in addition to oral vitamin B12 supplements.   INTERVAL HISTORY: Lindsey Jordan 72 y.o. female returns to the clinic today for follow-up visit.  The patient is feeling much better today with no concerning complaints except for occasional fatigue.  She also has some pain in the abdomen that usually start 1 hour after eating her meal.  She denied having any current nausea, vomiting, diarrhea or constipation.  She has no chest pain, shortness of breath, cough or hemoptysis.  She has no recent weight loss or night sweats.  She has been tolerating her treatment with oral iron tablet and vitamin C fairly well.  She is here today for evaluation and repeat blood work.  MEDICAL HISTORY: Past Medical History:  Diagnosis Date   Diabetes mellitus without complication (HCC)    Hypertension    Kidney stones    Morbid obesity (HCC)    Paroxysmal atrial fibrillation (HCC)    PONV (postoperative nausea and vomiting)     ALLERGIES:  is allergic to clarithromycin, other, statins, welchol [colesevelam hcl], and welchol [colesevelam].  MEDICATIONS:  Current Outpatient Medications  Medication Sig Dispense Refill   acetaminophen (TYLENOL) 500 MG tablet Take 1,000 mg by mouth at bedtime.     acyclovir (ZOVIRAX) 200 MG capsule Take 200 mg by mouth 3 (three) times daily as needed (outbreaks).     amitriptyline (ELAVIL) 10 MG tablet Take  5 mg by mouth at bedtime.     clonazePAM (KLONOPIN) 1 MG tablet Take 1 mg by mouth at bedtime.     diltiazem (CARDIZEM CD) 240 MG 24 hr capsule Take 240 mg by mouth daily.     escitalopram (LEXAPRO) 10 MG tablet Take 10 mg by mouth at bedtime.     Evolocumab (REPATHA SURECLICK) 140 MG/ML SOAJ INJECT 1 PEN INTO THE SKIN EVERY 14 (FOURTEEN) DAYS. 6 mL 3   fluticasone (FLONASE) 50 MCG/ACT nasal spray Place 2 sprays into both nostrils daily.     guaiFENesin-dextromethorphan (ROBITUSSIN DM) 100-10 MG/5ML syrup Take 5 mLs by mouth every 4 (four) hours as needed for cough (chest congestion). (Patient not taking: Reported on 12/28/2022) 118 mL 0   metFORMIN (GLUCOPHAGE-XR) 500 MG 24 hr tablet Take 500 mg by mouth at bedtime.     metoprolol tartrate (LOPRESSOR) 25 MG tablet Take 0.5 tablets (12.5 mg total) by mouth 2 (two) times daily. 90 tablet 3   nitroGLYCERIN (NITROSTAT) 0.4 MG SL tablet PLACE 1 TABLET UNDER THE TONGUE EVERY 5 MINUTES X 3 DOSES AS NEEDED FOR CHEST PAIN. 25 tablet 2   rivaroxaban (XARELTO) 20 MG TABS tablet Take 20 mg by mouth daily.     triamterene-hydrochlorothiazide (MAXZIDE-25) 37.5-25 MG tablet Take 1 tablet by mouth daily.     No current facility-administered medications for this visit.    SURGICAL HISTORY:  Past Surgical History:  Procedure Laterality Date   ABDOMINAL HYSTERECTOMY  2003   ADENOIDECTOMY     age 89  ATRIAL FIBRILLATION ABLATION N/A 04/06/2021   Procedure: ATRIAL FIBRILLATION ABLATION;  Surgeon: Hillis Range, MD;  Location: [facility];  Service: Cardiovascular;  Laterality: N/A;   BREAST LUMPECTOMY     x 2 in her 20's   BREAST SURGERY     CESAREAN SECTION     one previous   ECTOPIC PREGNANCY SURGERY     ESOPHAGOGASTRODUODENOSCOPY (EGD) WITH PROPOFOL N/A 12/03/2022   Procedure: ESOPHAGOGASTRODUODENOSCOPY (EGD) WITH PROPOFOL;  Surgeon: Charlott Rakes, MD;  Location: [facility] ENDOSCOPY;  Service: Gastroenterology;  Laterality: N/A;   GIVENS CAPSULE STUDY  N/A 12/03/2022   Procedure: GIVENS CAPSULE STUDY;  Surgeon: Charlott Rakes, MD;  Location: [facility] ENDOSCOPY;  Service: Gastroenterology;  Laterality: N/A;   HEMORRHOID SURGERY     in 20's   LEFT HEART CATH AND CORONARY ANGIOGRAPHY N/A 11/23/2020   Procedure: LEFT HEART CATH AND CORONARY ANGIOGRAPHY;  Surgeon: Lyn Records, MD;  Location: [facility];  Service: Cardiovascular;  Laterality: N/A;   TEE WITHOUT CARDIOVERSION N/A 04/06/2021   Procedure: TRANSESOPHAGEAL ECHOCARDIOGRAM (TEE);  Surgeon: Sande Rives, MD;  Location: [facility] ENDOSCOPY;  Service: Cardiovascular;  Laterality: N/A;   TONSILLECTOMY      REVIEW OF SYSTEMS:  A comprehensive review of systems was negative except for: Constitutional: positive for fatigue Gastrointestinal: positive for abdominal pain   PHYSICAL EXAMINATION: General appearance: alert, cooperative, fatigued, and no distress Head: Normocephalic, without obvious abnormality, atraumatic Neck: no adenopathy, no JVD, supple, symmetrical, trachea midline, and thyroid not enlarged, symmetric, no tenderness/mass/nodules Lymph nodes: Cervical, supraclavicular, and axillary nodes normal. Resp: clear to auscultation bilaterally Back: symmetric, no curvature. ROM normal. No CVA tenderness. Cardio: regular rate and rhythm, S1, S2 normal, no murmur, click, rub or gallop GI: soft, non-tender; bowel sounds normal; no masses,  no organomegaly Extremities: extremities normal, atraumatic, no cyanosis or edema  ECOG PERFORMANCE STATUS: 1 - Symptomatic but completely ambulatory  Blood pressure (!) 139/56, pulse 79, temperature 97.7 F (36.5 C), temperature source Oral, resp. rate 16, height 5\' 7"  (1.702 m), weight 231 lb 8 oz (105 kg), SpO2 99 %.  LABORATORY DATA: Lab Results  Component Value Date   WBC 6.6 03/13/2023   HGB 12.8 03/13/2023   HCT 40.8 03/13/2023   MCV 86.3 03/13/2023   PLT 240 03/13/2023      Chemistry      Component Value Date/Time   NA 139  01/05/2023 1120   NA 140 11/30/2022 1134   K 3.4 (L) 01/05/2023 1120   CL 101 01/05/2023 1120   CO2 26 01/05/2023 1120   BUN 15 01/05/2023 1120   BUN 14 11/30/2022 1134   CREATININE 0.87 01/05/2023 1120      Component Value Date/Time   CALCIUM 9.7 01/05/2023 1120   ALKPHOS 88 01/05/2023 1120   AST 28 01/05/2023 1120   ALT 33 01/05/2023 1120   BILITOT 0.3 01/05/2023 1120       RADIOGRAPHIC STUDIES: No results found.  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  The patient is also on anticoagulation with Xarelto and Repatha which may have contributed more to her gastrointestinal blood loss. The patient is currently on over-the-counter oral iron and vitamin B12 tablets and she is tolerating it fairly well. Repeat CBC showed significant improvement in her anemia with hemoglobin up to 12.8 and  hematocrit 40.8%. Iron study and ferritin are still pending. I recommended for the patient to continue her current treatment with the oral iron tablet and vitamin B supplements. For the abdominal pain after her meals, I recommended for her to see her gastroenterologist for evaluation and to rule out ischemic colitis especially with her strong history of coronary artery disease. I will see the patient on as-needed basis at this point and she will follow-up with her primary care physician and gastroenterologist. She was advised to call immediately if she has any other concerning symptoms in the interval. The patient voices understanding of current disease status and treatment options and is in agreement with the current care plan.  All questions were answered. The patient knows to call the clinic with any problems, questions or concerns. We can certainly see the patient much sooner if  necessary.  The total time spent in the appointment was 20 minutes.  Disclaimer: This note was dictated with voice recognition software. Similar sounding words can inadvertently be transcribed and may not be corrected upon review.

## 2023-03-15 DIAGNOSIS — J40 Bronchitis, not specified as acute or chronic: Secondary | ICD-10-CM | POA: Diagnosis not present

## 2023-03-15 DIAGNOSIS — R0981 Nasal congestion: Secondary | ICD-10-CM | POA: Diagnosis not present

## 2023-03-15 DIAGNOSIS — R059 Cough, unspecified: Secondary | ICD-10-CM | POA: Diagnosis not present

## 2023-03-15 DIAGNOSIS — J329 Chronic sinusitis, unspecified: Secondary | ICD-10-CM | POA: Diagnosis not present

## 2023-03-27 ENCOUNTER — Encounter: Payer: Self-pay | Admitting: Cardiology

## 2023-03-31 ENCOUNTER — Ambulatory Visit: Payer: Medicare Other | Admitting: Cardiology

## 2023-04-11 ENCOUNTER — Ambulatory Visit (HOSPITAL_BASED_OUTPATIENT_CLINIC_OR_DEPARTMENT_OTHER): Payer: Medicare Other | Admitting: Cardiology

## 2023-04-11 ENCOUNTER — Encounter (HOSPITAL_BASED_OUTPATIENT_CLINIC_OR_DEPARTMENT_OTHER): Payer: Self-pay | Admitting: Cardiology

## 2023-04-11 VITALS — BP 126/76 | HR 96 | Ht 67.0 in | Wt 240.0 lb

## 2023-04-11 DIAGNOSIS — D6869 Other thrombophilia: Secondary | ICD-10-CM

## 2023-04-11 DIAGNOSIS — K31811 Angiodysplasia of stomach and duodenum with bleeding: Secondary | ICD-10-CM

## 2023-04-11 DIAGNOSIS — I25118 Atherosclerotic heart disease of native coronary artery with other forms of angina pectoris: Secondary | ICD-10-CM | POA: Diagnosis not present

## 2023-04-11 DIAGNOSIS — I480 Paroxysmal atrial fibrillation: Secondary | ICD-10-CM

## 2023-04-11 DIAGNOSIS — E7800 Pure hypercholesterolemia, unspecified: Secondary | ICD-10-CM | POA: Diagnosis not present

## 2023-04-11 MED ORDER — METOPROLOL TARTRATE 50 MG PO TABS: 50.0000 mg | ORAL_TABLET | Freq: Two times a day (BID) | ORAL | 3 refills | Status: DC

## 2023-04-11 NOTE — Progress Notes (Signed)
  Cardiology Office Note:  .   Date:  04/11/2023  ID:  Lindsey Jordan, DOB 1951/01/13, MRN [id number] PCP: Merri Brunette, MD  [facility] Providers Cardiologist:  Donato Schultz, MD    History of Present Illness: .   Lindsey Jordan is a 72 y.o. female here for follow-up moderate nonobstructive coronary artery disease, palpitations with rare PVCs PACs, normal pump function, atrial fibrillation paroxysmal post ablation Dr. Johney Frame.  Dr. Rebecca Eaton note from oncology reviewed has persistent anemia unclear etiology possible AVM which was detected on capsule endoscopy. POTS.  No atrial fibrillation on ZIO.  Still taking Xarelto.  Severe anemia previously.  AVMs as above.  Abdominal banding like pain after eating previously.  This is improving.  2023 had RSV bronchitis. Had to retire.   ROS: No chest pain.  Studies Reviewed: .        ZIO monitor April 2024-sinus rhythm 78 rare PVCs PACs, no atrial fibrillation reassuring monitor  Echocardiogram-normal pump function 60% EF.  Right ventricle mildly reduced, pulmonary pressures 27 mmHg.  Mild calcification aortic valve.  No stenosis.  Cardiac catheterization 2022-proximal to mid LAD 40%, first diagonal 40%.  Moderate nonobstructive disease.  Risk Assessment/Calculations:    CHA2DS2-VASc Score = 4   This indicates a 4.8% annual risk of stroke. The patient's score is based upon: CHF History: 0 HTN History: 1 Diabetes History: 1 Stroke History: 0 Vascular Disease History: 0 Age Score: 1 Gender Score: 1           Physical Exam:   VS:  BP 126/76   Pulse 96   Ht 5\' 7"  (1.702 m)   Wt 240 lb (108.9 kg)   LMP  (LMP Unknown)   BMI 37.59 kg/m    Wt Readings from Last 3 Encounters:  04/11/23 240 lb (108.9 kg)  03/13/23 231 lb 8 oz (105 kg)  01/26/23 236 lb (107 kg)    GEN: Well nourished, well developed in no acute distress NECK: No JVD; No carotid bruits CARDIAC: RRR, no murmurs, rubs, gallops RESPIRATORY:  Clear to  auscultation without rales, wheezing or rhonchi  ABDOMEN: Soft, non-tender, non-distended EXTREMITIES:  No edema; No deformity   ASSESSMENT AND PLAN: .    Paroxysmal atrial fibrillation status post ablation - Diltiazem 240, Xarelto 20. Stop dilt and increase metoprolol to 50 bid.  Diltiazem was being stopped because dentist noted gum changes. - Normal echo - No atrial fibrillation on most recent ZIO monitor.  Excellent.  Chronic anticoagulation/secondary hypercoagulable state - On Xarelto.  Has had ongoing anemia issues.  GI has reviewed.  Capsule endoscopy. -I will refer to structural clinic for watchman.  Showed her images today.  Discussion.  AV malformation - Detected on capsule endoscopy.  Repeat CBC 12.8.  Has had some pain after meals.  Following with GI.  Diarrhea.  Slowly improving.  Moderate coronary artery disease nonobstructive - Cardiac catheterization reviewed from 2022.  Continuing with medical therapy. Repatha.Started back a few weeks ago. LDL goal < 70.  Continue.  Primary hypertension - Tolerating slightly elevated blood pressures in the setting of possible POTS based upon patient history.      Dispo: 6 months with APP, Tessa  Signed, Donato Schultz, MD

## 2023-04-11 NOTE — Patient Instructions (Signed)
Medication Instructions:  Please increase Metoprolol Tartrate to 50 mg twice daily. Please discontinue your Diltiazem. Continue all other medications as listed.  *If you need a refill on your cardiac medications before your next appointment, please call your pharmacy*  You have been referred to our Electrophysiology at our [facility] office.  You will be contacted to be scheduled.  Follow-Up: At [facility], you and your health needs are our priority.  As part of our continuing mission to provide you with exceptional heart care, we have created designated Provider Care Teams.  These Care Teams include your primary Cardiologist (physician) and Advanced Practice Providers (APPs -  Physician Assistants and Nurse Practitioners) who all work together to provide you with the care you need, when you need it.  We recommend signing up for the patient portal called "MyChart".  Sign up information is provided on this After Visit Summary.  MyChart is used to connect with patients for Virtual Visits (Telemedicine).  Patients are able to view lab/test results, encounter notes, upcoming appointments, etc.  Non-urgent messages can be sent to your provider as well.   To learn more about what you can do with MyChart, go to [email or web address].    Your next appointment:   6 month(s)  Provider:   Jari Favre, PA-C

## 2023-04-13 ENCOUNTER — Telehealth: Payer: Self-pay

## 2023-04-13 NOTE — Telephone Encounter (Signed)
Per Dr. Skains, called to arrange Watchman consult with Dr. Lambert.  Left message to call back. 

## 2023-04-17 NOTE — Telephone Encounter (Signed)
Left message to call back  

## 2023-04-18 NOTE — Telephone Encounter (Signed)
Scheduled the patient for Watchman consult with Dr. Lalla Brothers 08/14/2023. She was grateful for call and agreed with plan.

## 2023-04-18 NOTE — Telephone Encounter (Signed)
Left message for the patient to call back if she would like to be scheduled with Dr .Lalla Brothers to discuss LAAO.

## 2023-04-25 DIAGNOSIS — Z85828 Personal history of other malignant neoplasm of skin: Secondary | ICD-10-CM | POA: Diagnosis not present

## 2023-04-25 DIAGNOSIS — L570 Actinic keratosis: Secondary | ICD-10-CM | POA: Diagnosis not present

## 2023-04-25 DIAGNOSIS — L738 Other specified follicular disorders: Secondary | ICD-10-CM | POA: Diagnosis not present

## 2023-04-25 DIAGNOSIS — D1722 Benign lipomatous neoplasm of skin and subcutaneous tissue of left arm: Secondary | ICD-10-CM | POA: Diagnosis not present

## 2023-04-26 DIAGNOSIS — E119 Type 2 diabetes mellitus without complications: Secondary | ICD-10-CM | POA: Diagnosis not present

## 2023-04-26 DIAGNOSIS — I480 Paroxysmal atrial fibrillation: Secondary | ICD-10-CM | POA: Diagnosis not present

## 2023-04-26 DIAGNOSIS — E7800 Pure hypercholesterolemia, unspecified: Secondary | ICD-10-CM | POA: Diagnosis not present

## 2023-04-26 DIAGNOSIS — Z0000 Encounter for general adult medical examination without abnormal findings: Secondary | ICD-10-CM | POA: Diagnosis not present

## 2023-04-26 DIAGNOSIS — D6859 Other primary thrombophilia: Secondary | ICD-10-CM | POA: Diagnosis not present

## 2023-04-26 DIAGNOSIS — Z1331 Encounter for screening for depression: Secondary | ICD-10-CM | POA: Diagnosis not present

## 2023-04-26 DIAGNOSIS — E611 Iron deficiency: Secondary | ICD-10-CM | POA: Diagnosis not present

## 2023-04-26 DIAGNOSIS — I10 Essential (primary) hypertension: Secondary | ICD-10-CM | POA: Diagnosis not present

## 2023-04-26 DIAGNOSIS — E669 Obesity, unspecified: Secondary | ICD-10-CM | POA: Diagnosis not present

## 2023-04-26 DIAGNOSIS — Z1159 Encounter for screening for other viral diseases: Secondary | ICD-10-CM | POA: Diagnosis not present

## 2023-05-02 DIAGNOSIS — R310 Gross hematuria: Secondary | ICD-10-CM | POA: Diagnosis not present

## 2023-05-04 DIAGNOSIS — D649 Anemia, unspecified: Secondary | ICD-10-CM | POA: Diagnosis not present

## 2023-05-04 DIAGNOSIS — R197 Diarrhea, unspecified: Secondary | ICD-10-CM | POA: Diagnosis not present

## 2023-05-08 DIAGNOSIS — G4721 Circadian rhythm sleep disorder, delayed sleep phase type: Secondary | ICD-10-CM | POA: Diagnosis not present

## 2023-05-08 DIAGNOSIS — G4700 Insomnia, unspecified: Secondary | ICD-10-CM | POA: Diagnosis not present

## 2023-05-08 DIAGNOSIS — F411 Generalized anxiety disorder: Secondary | ICD-10-CM | POA: Diagnosis not present

## 2023-05-23 DIAGNOSIS — K080 Exfoliation of teeth due to systemic causes: Secondary | ICD-10-CM | POA: Diagnosis not present

## 2023-05-24 ENCOUNTER — Other Ambulatory Visit: Payer: Self-pay | Admitting: Cardiology

## 2023-05-24 DIAGNOSIS — I480 Paroxysmal atrial fibrillation: Secondary | ICD-10-CM

## 2023-05-25 NOTE — Telephone Encounter (Signed)
Prescription refill request for Xarelto received.   Indication: afib  Last office visit: 04/11/2023, skains Weight: 108.9 kg  Age: 72 yo  Scr: 0.87, 01/05/2023 CrCl: 99 ml/min   Refill sent.

## 2023-06-04 DIAGNOSIS — E119 Type 2 diabetes mellitus without complications: Secondary | ICD-10-CM | POA: Diagnosis not present

## 2023-06-07 DIAGNOSIS — E119 Type 2 diabetes mellitus without complications: Secondary | ICD-10-CM | POA: Diagnosis not present

## 2023-06-07 DIAGNOSIS — G4700 Insomnia, unspecified: Secondary | ICD-10-CM | POA: Diagnosis not present

## 2023-06-15 ENCOUNTER — Other Ambulatory Visit: Payer: Self-pay | Admitting: Cardiology

## 2023-06-27 DIAGNOSIS — Z1231 Encounter for screening mammogram for malignant neoplasm of breast: Secondary | ICD-10-CM | POA: Diagnosis not present

## 2023-07-04 DIAGNOSIS — E119 Type 2 diabetes mellitus without complications: Secondary | ICD-10-CM | POA: Diagnosis not present

## 2023-07-12 NOTE — Telephone Encounter (Signed)
Due to provider schedule change, rescheduled patient for [facility] consult with Dr. Lalla Brothers to 08/02/2923. She was grateful for call and agreed with plan.

## 2023-07-18 DIAGNOSIS — E119 Type 2 diabetes mellitus without complications: Secondary | ICD-10-CM | POA: Diagnosis not present

## 2023-08-01 NOTE — Progress Notes (Unsigned)
Electrophysiology Office Note:    Date:  08/02/2023   ID:  Lindsey Jordan, DOB Mar 02, 1951, MRN [id number]  [facility] Cardiologist:  Donato Schultz, MD  [facility] Electrophysiologist:  Lanier Prude, MD   Referring MD: Merri Brunette, MD   Chief Complaint: AF  History of Present Illness:      Discussed the use of AI scribe software for clinical note transcription with the patient, who gave verbal consent to proceed.  History of Present Illness   Lindsey Jordan, a 72 year old woman with a history of coronary artery disease, atrial fibrillation, anemia, and a history of AVMs and GI blood loss, presents for evaluation of atrial fibrillation and discussion of stroke risk mitigation strategies. She is currently on Xarelto for stroke prophylaxis. She had a prior catheter ablation in July of 2022, which has been successful. She had an episode of anemia earlier this year. The cause of the anemia was initially unclear, but after discharge was postulated to be secondary to metformin, which she has since discontinued. She reports that her stomach distress has resolved since stopping the metformin. She has been studying the Watchman procedure and is considering it as a potential option for stroke risk mitigation. She also mentions that she is starting on Mounjaro for type 2 diabetes and is considering knee replacement surgery in the future.               Their past medical, social and family history was reveiwed.   ROS:   Please see the history of present illness.    All other systems reviewed and are negative.  EKGs/Labs/Other Studies Reviewed:    The following studies were reviewed today:  January 18, 2023 ZIO monitor No atrial fibrillation Average heart rate 78 Rare supraventricular and ventricular ectopy  December 23, 2022 echo EF 55 to 60% RV mildly reduced Trivial MR         Physical Exam:    VS:  BP 128/60   Pulse 84   Ht 5\' 7"  (1.702 m)   Wt 254 lb  (115.2 kg)   LMP  (LMP Unknown)   SpO2 95%   BMI 39.78 kg/m     Wt Readings from Last 3 Encounters:  08/02/23 254 lb (115.2 kg)  04/11/23 240 lb (108.9 kg)  03/13/23 231 lb 8 oz (105 kg)     GEN:  Well nourished, well developed in no acute distress CARDIAC: RRR, no murmurs, rubs, gallops RESPIRATORY:  Clear to auscultation without rales, wheezing or rhonchi       ASSESSMENT AND PLAN:    1. Paroxysmal atrial fibrillation (HCC)   2. 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 Essential hypertension         Assessment and Plan    Atrial Fibrillation History of pulmonary vein isolation ablation in 2022. Currently on Xarelto for stroke prophylaxis. CHADS-VASc score of 4. Discussed the risk of GI bleeding on Xarelto given history of AVMs and anemia. Also discussed the Watchman procedure as an alternative for stroke risk mitigation. -Continue Xarelto for now. -Initiate evaluation for Watchman procedure per patient's preference.  Hypertension Well controlled on metoprolol and diltiazem. -Continue metoprolol and diltiazem. -Continue home blood pressure monitoring.  Anemia Recent episode of anemia, possibly related to metformin use.  -Monitor for recurrence of anemia. - Watchman as above      ------------------------  I have seen Lindsey Jordan in the office today who is being considered for a Watchman left  atrial appendage closure device. I believe they will benefit from this procedure given their history of atrial fibrillation, CHA2DS2-VASc score of 4. Unfortunately, the patient is not felt to be a long term anticoagulation candidate secondary to hx of GI bleeding/anemia. The patient's chart has been reviewed and I feel that they would be a candidate for short term oral anticoagulation after Watchman implant.   It is my belief that after undergoing a LAA closure procedure, Lindsey Jordan will not need long term  anticoagulation which eliminates anticoagulation side effects and major bleeding risk.   Procedural risks for the Watchman implant have been reviewed with the patient including a 0.5% risk of stroke, <1% risk of perforation and <1% risk of device embolization. Other risks include bleeding, vascular damage, tamponade, worsening renal function, and death. The patient understands these risk and wishes to proceed.     The published clinical data on the safety and effectiveness of WATCHMAN include but are not limited to the following: - Holmes DR, Everlene Farrier, Sick P et al. for the PROTECT AF Investigators. Percutaneous closure of the left atrial appendage versus warfarin therapy for prevention of stroke in patients with atrial fibrillation: a randomised non-inferiority trial. Lancet 2009; 374: 534-42. Everlene Farrier, Doshi SK, Isa Rankin D et al. on behalf of the PROTECT AF Investigators. Percutaneous Left Atrial Appendage Closure for Stroke Prophylaxis in Patients With Atrial Fibrillation 2.3-Year Follow-up of the PROTECT AF (Watchman Left Atrial Appendage System for Embolic Protection in Patients With Atrial Fibrillation) Trial. Circulation 2013; 127:720-729. - Alli O, Doshi S,  Kar S, Reddy VY, Sievert H et al. Quality of Life Assessment in the Randomized PROTECT AF (Percutaneous Closure of the Left Atrial Appendage Versus Warfarin Therapy for Prevention of Stroke in Patients With Atrial Fibrillation) Trial of Patients at Risk for Stroke With Nonvalvular Atrial Fibrillation. J Am Coll Cardiol 2013; 61:1790-8. Aline August DR, Mia [place], Price M, Whisenant B, Sievert H, Doshi S, Huber K, Reddy V. Prospective randomized evaluation of the Watchman left atrial appendage Device in patients with atrial fibrillation versus long-term warfarin therapy; the PREVAIL trial. Journal of the [employer] of Cardiology, Vol. 4, No. 1, 2014, 1-11. - Kar S, Doshi SK, Sadhu A, Horton R, Osorio J et al. Primary outcome evaluation  of a next-generation left atrial appendage closure device: results from the PINNACLE FLX trial. Circulation 2021;143(18)1754-1762.    After today's visit with the patient which was dedicated solely for shared decision making visit regarding LAA closure device, the patient decided to proceed with the LAA appendage closure procedure scheduled to be done in the near future at [facility]. Prior to the procedure, I would like to obtain a gated CT scan of the chest with contrast timed for PV/LA visualization.    HAS-BLED score 3 Hypertension Yes  Abnormal renal and liver function (Dialysis, transplant, Cr >2.26 mg/dL /Cirrhosis or Bilirubin >2x Normal or AST/ALT/AP >3x Normal) No  Stroke No  Bleeding Yes  Labile INR (Unstable/high INR) No  Elderly (>65) Yes  Drugs or alcohol (>= 8 drinks/week, anti-plt or NSAID) No   CHA2DS2-VASc Score = 4  The patient's score is based upon: CHF History: 0 HTN History: 1 Diabetes History: 1 Stroke History: 0 Vascular Disease History: 0 Age Score: 1 Gender Score: 1           Signed, Sheria Lang T. Lalla Brothers, MD, [facility], [facility] 08/02/2023 8:16 PM    Electrophysiology [facility]

## 2023-08-02 ENCOUNTER — Ambulatory Visit: Payer: Medicare Other | Attending: Cardiology | Admitting: Cardiology

## 2023-08-02 ENCOUNTER — Encounter: Payer: Self-pay | Admitting: Cardiology

## 2023-08-02 VITALS — BP 128/60 | HR 84 | Ht 67.0 in | Wt 254.0 lb

## 2023-08-02 DIAGNOSIS — K31811 Angiodysplasia of stomach and duodenum with bleeding: Secondary | ICD-10-CM | POA: Diagnosis not present

## 2023-08-02 DIAGNOSIS — I10 Essential (primary) hypertension: Secondary | ICD-10-CM | POA: Diagnosis not present

## 2023-08-02 DIAGNOSIS — I480 Paroxysmal atrial fibrillation: Secondary | ICD-10-CM | POA: Diagnosis not present

## 2023-08-02 NOTE — Patient Instructions (Signed)
Medication Instructions:  Your physician recommends that you continue on your current medications as directed. Please refer to the Current Medication list given to you today.  *If you need a refill on your cardiac medications before your next appointment, please call your pharmacy*  Lab Work: TODAY: BMET  If you have labs (blood work) drawn today and your tests are completely normal, you will receive your results only by: MyChart Message (if you have MyChart) OR A paper copy in the mail If you have any lab test that is abnormal or we need to change your treatment, we will call you to review the results.  Testing/Procedures: Your physician has requested that you have cardiac CT. Cardiac computed tomography (CT) is a painless test that uses an x-ray machine to take clear, detailed pictures of your heart. For further information please visit [email or web address] Please follow instruction sheet as given.  Your physician has requested that you have Left atrial appendage (LAA) closure device implantation is a procedure to put a small device in the LAA of the heart. The LAA is a small sac in the wall of the heart's left upper chamber. Blood clots can form in this area. The device, Watchman closes the LAA to help prevent a blood clot and stroke.   Follow-Up: At [facility], you and your health needs are our priority.  As part of our continuing mission to provide you with exceptional heart care, we have created designated Provider Care Teams.  These Care Teams include your primary Cardiologist (physician) and Advanced Practice Providers (APPs -  Physician Assistants and Nurse Practitioners) who all work together to provide you with the care you need, when you need it.   Your next appointment:   You will be contacted by Nurse Navigator, Karsten Fells to schedule your pre-procedure visit and procedure date. If you have any questions she can be reached at [phone].

## 2023-08-03 LAB — BASIC METABOLIC PANEL
BUN/Creatinine Ratio: 18 (ref 12–28)
BUN: 12 mg/dL (ref 8–27)
CO2: 26 mmol/L (ref 20–29)
Calcium: 9.6 mg/dL (ref 8.7–10.3)
Chloride: 96 mmol/L (ref 96–106)
Creatinine, Ser: 0.67 mg/dL (ref 0.57–1.00)
Glucose: 144 mg/dL — ABNORMAL HIGH (ref 70–99)
Potassium: 3.5 mmol/L (ref 3.5–5.2)
Sodium: 139 mmol/L (ref 134–144)
eGFR: 92 mL/min/{1.73_m2} (ref >59)

## 2023-08-04 DIAGNOSIS — E119 Type 2 diabetes mellitus without complications: Secondary | ICD-10-CM | POA: Diagnosis not present

## 2023-08-07 ENCOUNTER — Ambulatory Visit: Payer: Medicare Other | Admitting: Cardiology

## 2023-08-07 DIAGNOSIS — H524 Presbyopia: Secondary | ICD-10-CM | POA: Diagnosis not present

## 2023-08-14 ENCOUNTER — Institutional Professional Consult (permissible substitution): Payer: Medicare Other | Admitting: Cardiology

## 2023-08-17 ENCOUNTER — Ambulatory Visit (HOSPITAL_COMMUNITY)
Admission: RE | Admit: 2023-08-17 | Discharge: 2023-08-17 | Disposition: A | Discharge status: Home / Self Care | Payer: Medicare Other | Source: Ambulatory Visit | Attending: Cardiology | Admitting: Cardiology

## 2023-08-17 DIAGNOSIS — K31811 Angiodysplasia of stomach and duodenum with bleeding: Secondary | ICD-10-CM | POA: Insufficient documentation

## 2023-08-17 DIAGNOSIS — I480 Paroxysmal atrial fibrillation: Secondary | ICD-10-CM | POA: Diagnosis not present

## 2023-08-17 DIAGNOSIS — I10 Essential (primary) hypertension: Secondary | ICD-10-CM | POA: Diagnosis not present

## 2023-08-17 MED ORDER — IOHEXOL 350 MG/ML SOLN
100.0000 mL | Freq: Once | INTRAVENOUS | Status: AC | PRN
  Administered 2023-08-17: 100 mL via INTRAVENOUS

## 2023-08-21 ENCOUNTER — Telehealth: Payer: Self-pay

## 2023-08-21 NOTE — Telephone Encounter (Signed)
Soul Nebergall: Large appendage with prominent trabeculation along the coumadin ridge and anterior take-off. Max 30/ AVG 26/ Depth 15.6 Likely need a 31mm device and double curve sheath Will need a very inferior/ Mid-Ant TSP RAO 9 CAU 4 May be considered for a TruSteer steerable sheath.

## 2023-08-23 ENCOUNTER — Telehealth: Payer: Self-pay

## 2023-08-23 NOTE — Telephone Encounter (Signed)
-----   Message from Rossie Muskrat [facility] sent at 08/18/2023  5:40 PM EST ----- OK to proceed with Watchman evaluation.   Sheria Lang T. Lalla Brothers, MD, [facility], [facility] Cardiac Electrophysiology

## 2023-08-23 NOTE — Telephone Encounter (Signed)
Reviewed results with patient who verbalized understanding.   The patient wishes to proceed with LAAO on 09/21/2023. Pre-procedure visit scheduled 09/06/2023. She was grateful for call and agreed with plan.

## 2023-08-24 ENCOUNTER — Other Ambulatory Visit: Payer: Self-pay

## 2023-08-24 DIAGNOSIS — I480 Paroxysmal atrial fibrillation: Secondary | ICD-10-CM

## 2023-08-24 DIAGNOSIS — K31811 Angiodysplasia of stomach and duodenum with bleeding: Secondary | ICD-10-CM

## 2023-08-28 ENCOUNTER — Telehealth: Payer: Self-pay

## 2023-08-28 DIAGNOSIS — H26492 Other secondary cataract, left eye: Secondary | ICD-10-CM | POA: Diagnosis not present

## 2023-09-03 DIAGNOSIS — E119 Type 2 diabetes mellitus without complications: Secondary | ICD-10-CM | POA: Diagnosis not present

## 2023-09-04 NOTE — Progress Notes (Unsigned)
[facility]   MULTIDISCIPLINARY HEART VALVE CLINIC                                     Cardiology Office Note:    Date:  09/06/2023   ID:  Lindsey Jordan, DOB 22-Oct-1950, MRN [id number]  PCP:  Merri Brunette, MD  [facility] Cardiologist:  Donato Schultz, MD  [facility] Electrophysiologist:  Lanier Prude, MD   Referring MD: Merri Brunette, MD   Pre Watchman visit- scheduled 12/19  History of Present Illness:    Lindsey Jordan is a 72 y.o. female with a hx of moderate nonobstructive coronary artery disease, palpitations, POTs, PAF s/p ablation on Xarelto, and persistent anemia who presents to clinic for follow up.   She underwent atrial fib ablation with Dr. Johney Frame in 04/2021. Zio in March of this year did not show recurrent afib. She has had ongoing issues with anemia of unclear etiology. Felt to be possibly due to Metformin which was discontinued. She was found to have possible AVMs which were detected on capsule endoscopy. She is followed by Dr. Gwenyth Bouillon with heme onc. She was referred to Dr. Lalla Brothers to discuss Watchman for stroke risk mitigation strategies.   Pre procedure CT 08/17/23 showed a large mixed windsock/cauliflower morphology suitable for a 31 mm Watchman FLX. Noted to have an elevated CAC.   Today the patient presents to clinic for follow up. No CP or SOB. No LE edema, orthopnea or PND.  Has had some mild dizziness but no syncope. No blood in stool or urine. No palpitations. Worried about her Hg/iron since she has not tolerated PO iron.     Past Medical History:  Diagnosis Date   Diabetes mellitus without complication (HCC)    Hypertension    Kidney stones    Morbid obesity (HCC)    Paroxysmal atrial fibrillation (HCC)    PONV (postoperative nausea and vomiting)      Current Medications: Current Meds  Medication Sig   acetaminophen (TYLENOL) 500 MG tablet Take 1,000 mg by mouth at bedtime.   acyclovir (ZOVIRAX) 200 MG capsule Take 200  mg by mouth 3 (three) times daily as needed (outbreaks).   amitriptyline (ELAVIL) 10 MG tablet Take 5 mg by mouth at bedtime as needed for sleep.   clonazePAM (KLONOPIN) 1 MG tablet Take 1 mg by mouth at bedtime.   escitalopram (LEXAPRO) 10 MG tablet Take 10 mg by mouth at bedtime.   Evolocumab (REPATHA SURECLICK) 140 MG/ML SOAJ INJECT 1 PEN INTO THE SKIN EVERY 14 (FOURTEEN) DAYS.   fluticasone (FLONASE) 50 MCG/ACT nasal spray Place 2 sprays into both nostrils daily.   metoprolol tartrate (LOPRESSOR) 50 MG tablet Take 1 tablet (50 mg total) by mouth 2 (two) times daily.   MOUNJARO 2.5 MG/0.5ML Pen Inject 2.5 mg into the skin once a week.   nitroGLYCERIN (NITROSTAT) 0.4 MG SL tablet PLACE 1 TABLET UNDER THE TONGUE EVERY 5 MINUTES X 3 DOSES AS NEEDED FOR CHEST PAIN.   triamterene-hydrochlorothiazide (MAXZIDE-25) 37.5-25 MG tablet Take 1 tablet by mouth daily.   XARELTO 20 MG TABS tablet TAKE 1 TABLET BY MOUTH DAILY WITH SUPPER      ROS:   Please see the history of present illness.    All other systems reviewed and are negative.  EKGs   EKG Interpretation Date/Time:  Wednesday September 06 2023 13:39:10 EST Ventricular Rate:  85 PR Interval:  182 QRS Duration:  78 QT Interval:  368 QTC Calculation: 437 R Axis:   -18  Text Interpretation: Sinus rhythm with occasional Premature ventricular complexes Inferior Q waves noted Confirmed by Cline Crock [phone]) on 09/06/2023 1:45:49 PM   Risk Assessment/Calculations:    HAS-BLED score 3 Hypertension Yes  Abnormal renal and liver function (Dialysis, transplant, Cr >2.26 mg/dL /Cirrhosis or Bilirubin >2x Normal or AST/ALT/AP >3x Normal) No  Stroke No  Bleeding Yes  Labile INR (Unstable/high INR) No  Elderly (>65) Yes  Drugs or alcohol (>= 8 drinks/week, anti-plt or NSAID) No    CHA2DS2-VASc Score = 4  The patient's score is based upon: CHF History: 0 HTN History: 1 Diabetes History: 1 Stroke History: 0 Vascular Disease History:  0 Age Score: 1 Gender Score: 1  Physical Exam:    VS:  BP (!) 144/72 (BP Location: Right Wrist, Patient Position: Sitting, Cuff Size: Normal)   Pulse 86   Resp 16   Ht 5\' 7"  (1.702 m)   Wt 236 lb 6.4 oz (107.2 kg)   LMP  (LMP Unknown)   SpO2 97%   BMI 37.03 kg/m     Wt Readings from Last 3 Encounters:  09/06/23 236 lb 6.4 oz (107.2 kg)  08/02/23 254 lb (115.2 kg)  04/11/23 240 lb (108.9 kg)     GEN: Well nourished, well developed in no acute distress NECK: No JVD CARDIAC: RRR, no murmurs, rubs, gallops RESPIRATORY:  Clear to auscultation without rales, wheezing or rhonchi  ABDOMEN: Soft, non-tender, non-distended EXTREMITIES:  No edema; No deformity.    ASSESSMENT:    1. PAF (paroxysmal atrial fibrillation) (HCC)   2. Anemia, unspecified type   3. Essential hypertension   4. Elevated coronary artery calcium score   5. Pre-procedure lab exam     PLAN:    In order of problems listed above:  PAF with a history of GI bleeding and anemia: set up for LAAO with Watchman on 12/19 with Dr. Lalla Brothers. BMET/CBC/ECG today. instruction letter and soap given to pt. Continue Xarelto 20mg  daily for now. Post implant medication strategy to be outlined after surgery. All questions answered today.  HTN: Bp mildly elevated initially but improved to 130/70 on my personal recheck. Continue Lopressor 50mg  BID.   Elevated CAC: noted on pre Watchman CT. Would anticipate destination therapy with aspirin 81mg  daily s/p Watchman.  Medication Adjustments/Labs and Tests Ordered: Current medicines are reviewed at length with the patient today.  Concerns regarding medicines are outlined above.  Orders Placed This Encounter  Procedures   CBC   Basic metabolic panel   EKG 12-Lead   No orders of the defined types were placed in this encounter.   Patient Instructions  Medication Instructions:  Your physician recommends that you continue on your current medications as directed. Please refer to  the Current Medication list given to you today.  *If you need a refill on your cardiac medications before your next appointment, please call your pharmacy*  Lab Work: TODAY: CBC, BMET If you have labs (blood work) drawn today and your tests are completely normal, you will receive your results only by: MyChart Message (if you have MyChart) OR A paper copy in the mail If you have any lab test that is abnormal or we need to change your treatment, we will call you to review the results.  Testing/Procedures: None ordered today.  Follow-Up: As instructed after procedure.   Signed, Cline Crock, PA-C  09/06/2023  2:18 PM    [facility]

## 2023-09-06 ENCOUNTER — Ambulatory Visit: Payer: Medicare Other | Attending: Internal Medicine | Admitting: Physician Assistant

## 2023-09-06 VITALS — BP 144/72 | HR 86 | Resp 16 | Ht 67.0 in | Wt 236.4 lb

## 2023-09-06 DIAGNOSIS — D649 Anemia, unspecified: Secondary | ICD-10-CM

## 2023-09-06 DIAGNOSIS — Z01812 Encounter for preprocedural laboratory examination: Secondary | ICD-10-CM

## 2023-09-06 DIAGNOSIS — R931 Abnormal findings on diagnostic imaging of heart and coronary circulation: Secondary | ICD-10-CM

## 2023-09-06 DIAGNOSIS — I10 Essential (primary) hypertension: Secondary | ICD-10-CM

## 2023-09-06 DIAGNOSIS — I480 Paroxysmal atrial fibrillation: Secondary | ICD-10-CM | POA: Diagnosis not present

## 2023-09-06 NOTE — Patient Instructions (Signed)
Medication Instructions:  Your physician recommends that you continue on your current medications as directed. Please refer to the Current Medication list given to you today.  *If you need a refill on your cardiac medications before your next appointment, please call your pharmacy*  Lab Work: TODAY: CBC, BMET If you have labs (blood work) drawn today and your tests are completely normal, you will receive your results only by: MyChart Message (if you have MyChart) OR A paper copy in the mail If you have any lab test that is abnormal or we need to change your treatment, we will call you to review the results.  Testing/Procedures: None ordered today.  Follow-Up: As instructed after procedure.

## 2023-09-07 LAB — BASIC METABOLIC PANEL
BUN/Creatinine Ratio: 13 (ref 12–28)
BUN: 12 mg/dL (ref 8–27)
CO2: 24 mmol/L (ref 20–29)
Calcium: 9.2 mg/dL (ref 8.7–10.3)
Chloride: 99 mmol/L (ref 96–106)
Creatinine, Ser: 0.94 mg/dL (ref 0.57–1.00)
Glucose: 117 mg/dL — ABNORMAL HIGH (ref 70–99)
Potassium: 4.1 mmol/L (ref 3.5–5.2)
Sodium: 139 mmol/L (ref 134–144)
eGFR: 64 mL/min/{1.73_m2} (ref >59)

## 2023-09-07 LAB — CBC
Hematocrit: 36.7 % (ref 34.0–46.6)
Hemoglobin: 10.8 g/dL — ABNORMAL LOW (ref 11.1–15.9)
MCH: 22.2 pg — ABNORMAL LOW (ref 26.6–33.0)
MCHC: 29.4 g/dL — ABNORMAL LOW (ref 31.5–35.7)
MCV: 75 fL — ABNORMAL LOW (ref 79–97)
Platelets: 322 10*3/uL (ref 150–450)
RBC: 4.87 x10E6/uL (ref 3.77–5.28)
RDW: 15.4 % (ref 11.7–15.4)
WBC: 7.9 10*3/uL (ref 3.4–10.8)

## 2023-09-15 ENCOUNTER — Telehealth: Payer: Self-pay

## 2023-09-15 NOTE — Telephone Encounter (Signed)
Spoke to patient for pre-Watchman call today. Reviewed instruction letter with all questions answered. Pre-procedure labs reviewed. Plan to 5:30AM arrival 12/19.   Georgie Chard NP-C Structural Heart Team  Phone: [phone]

## 2023-09-15 NOTE — Telephone Encounter (Signed)
Attempted pre-Watchman call.  Left message to call back.

## 2023-09-20 NOTE — Anesthesia Preprocedure Evaluation (Signed)
Anesthesia Evaluation  Patient identified by MRN, date of birth, ID band Patient awake    Reviewed: Allergy & Precautions, H&P , NPO status , Patient's Chart, lab work & pertinent test results  History of Anesthesia Complications (+) PONV and history of anesthetic complications  Airway Mallampati: III  TM Distance: >3 FB Neck ROM: Full    Dental no notable dental hx. (+) Teeth Intact, Dental Advisory Given   Pulmonary neg pulmonary ROS   Pulmonary exam normal breath sounds clear to auscultation       Cardiovascular Exercise Tolerance: Good hypertension, Pt. on medications and Pt. on home beta blockers + dysrhythmias Atrial Fibrillation  Rhythm:Regular Rate:Normal     Neuro/Psych negative neurological ROS  negative psych ROS   GI/Hepatic negative GI ROS, Neg liver ROS,,,  Endo/Other  diabetes, Type 2, Oral Hypoglycemic Agents  Class 3 obesity  Renal/GU negative Renal ROS  negative genitourinary   Musculoskeletal   Abdominal   Peds  Hematology  (+) Blood dyscrasia, anemia   Anesthesia Other Findings   Reproductive/Obstetrics negative OB ROS                             Anesthesia Physical Anesthesia Plan  ASA: 3  Anesthesia Plan: General   Post-op Pain Management: Tylenol PO (pre-op)*   Induction: Intravenous  PONV Risk Score and Plan: 4 or greater and Ondansetron, Dexamethasone and Droperidol  Airway Management Planned: Oral ETT  Additional Equipment: Arterial line  Intra-op Plan:   Post-operative Plan: Extubation in OR  Informed Consent: I have reviewed the patients History and Physical, chart, labs and discussed the procedure including the risks, benefits and alternatives for the proposed anesthesia with the patient or authorized representative who has indicated his/her understanding and acceptance.     Dental advisory given  Plan Discussed with: CRNA  Anesthesia  Plan Comments:        Anesthesia Quick Evaluation

## 2023-09-21 ENCOUNTER — Other Ambulatory Visit: Payer: Self-pay | Admitting: Cardiology

## 2023-09-21 ENCOUNTER — Other Ambulatory Visit: Payer: Self-pay

## 2023-09-21 ENCOUNTER — Inpatient Hospital Stay (HOSPITAL_COMMUNITY): Payer: Self-pay | Admitting: Anesthesiology

## 2023-09-21 ENCOUNTER — Encounter: Payer: Self-pay | Admitting: Cardiology

## 2023-09-21 ENCOUNTER — Inpatient Hospital Stay (HOSPITAL_COMMUNITY)
Admission: RE | Admit: 2023-09-21 | Discharge: 2023-09-21 | DRG: 274 | Disposition: A | Discharge status: Home / Self Care | Payer: Medicare Other | Attending: Cardiology | Admitting: Cardiology

## 2023-09-21 ENCOUNTER — Inpatient Hospital Stay (HOSPITAL_COMMUNITY): Payer: Medicare Other

## 2023-09-21 ENCOUNTER — Inpatient Hospital Stay (HOSPITAL_COMMUNITY)
Admission: RE | Disposition: A | Discharge status: Home / Self Care | Payer: Self-pay | Source: Home / Self Care | Attending: Cardiology

## 2023-09-21 ENCOUNTER — Encounter (HOSPITAL_COMMUNITY): Payer: Self-pay | Admitting: Cardiology

## 2023-09-21 DIAGNOSIS — E669 Obesity, unspecified: Secondary | ICD-10-CM | POA: Diagnosis present

## 2023-09-21 DIAGNOSIS — D6869 Other thrombophilia: Secondary | ICD-10-CM | POA: Diagnosis present

## 2023-09-21 DIAGNOSIS — E119 Type 2 diabetes mellitus without complications: Secondary | ICD-10-CM

## 2023-09-21 DIAGNOSIS — D649 Anemia, unspecified: Secondary | ICD-10-CM | POA: Diagnosis present

## 2023-09-21 DIAGNOSIS — Z95818 Presence of other cardiac implants and grafts: Secondary | ICD-10-CM

## 2023-09-21 DIAGNOSIS — E7800 Pure hypercholesterolemia, unspecified: Secondary | ICD-10-CM | POA: Diagnosis present

## 2023-09-21 DIAGNOSIS — I2510 Atherosclerotic heart disease of native coronary artery without angina pectoris: Secondary | ICD-10-CM | POA: Diagnosis present

## 2023-09-21 DIAGNOSIS — K31811 Angiodysplasia of stomach and duodenum with bleeding: Secondary | ICD-10-CM

## 2023-09-21 DIAGNOSIS — Z006 Encounter for examination for normal comparison and control in clinical research program: Secondary | ICD-10-CM

## 2023-09-21 DIAGNOSIS — I480 Paroxysmal atrial fibrillation: Secondary | ICD-10-CM

## 2023-09-21 DIAGNOSIS — Z7901 Long term (current) use of anticoagulants: Secondary | ICD-10-CM

## 2023-09-21 DIAGNOSIS — Z6839 Body mass index (BMI) 39.0-39.9, adult: Secondary | ICD-10-CM

## 2023-09-21 DIAGNOSIS — Z01818 Encounter for other preprocedural examination: Secondary | ICD-10-CM | POA: Diagnosis not present

## 2023-09-21 DIAGNOSIS — I10 Essential (primary) hypertension: Secondary | ICD-10-CM | POA: Diagnosis not present

## 2023-09-21 DIAGNOSIS — I4891 Unspecified atrial fibrillation: Secondary | ICD-10-CM | POA: Diagnosis present

## 2023-09-21 DIAGNOSIS — E66813 Obesity, class 3: Secondary | ICD-10-CM | POA: Diagnosis present

## 2023-09-21 DIAGNOSIS — K922 Gastrointestinal hemorrhage, unspecified: Secondary | ICD-10-CM | POA: Diagnosis present

## 2023-09-21 HISTORY — DX: Presence of other cardiac implants and grafts: Z95.818

## 2023-09-21 HISTORY — PX: TRANSESOPHAGEAL ECHOCARDIOGRAM (CATH LAB): EP1270

## 2023-09-21 HISTORY — PX: LEFT ATRIAL APPENDAGE OCCLUSION: EP1229

## 2023-09-21 LAB — GLUCOSE, CAPILLARY
Glucose-Capillary: 127 mg/dL — ABNORMAL HIGH (ref 70–99)
Glucose-Capillary: 124 mg/dL — ABNORMAL HIGH (ref 70–99)

## 2023-09-21 LAB — SURGICAL PCR SCREEN
MRSA, PCR: NEGATIVE
Staphylococcus aureus: POSITIVE — AB

## 2023-09-21 LAB — TYPE AND SCREEN
ABO/RH(D): A POS
Antibody Screen: NEGATIVE

## 2023-09-21 LAB — POCT ACTIVATED CLOTTING TIME: Activated Clotting Time: 337 s

## 2023-09-21 LAB — ECHO TEE

## 2023-09-21 SURGERY — LEFT ATRIAL APPENDAGE OCCLUSION
Anesthesia: General

## 2023-09-21 MED ORDER — ACETAMINOPHEN 325 MG PO TABS: 650.0000 mg | ORAL_TABLET | ORAL | Status: DC | PRN

## 2023-09-21 MED ORDER — CHLORHEXIDINE GLUCONATE 4 % EX SOLN: Freq: Once | CUTANEOUS | Status: DC

## 2023-09-21 MED ORDER — LIDOCAINE 2% (20 MG/ML) 5 ML SYRINGE
INTRAMUSCULAR | Status: DC | PRN
  Administered 2023-09-21: 40 mg via INTRAVENOUS

## 2023-09-21 MED ORDER — PROPOFOL 500 MG/50ML IV EMUL
INTRAVENOUS | Status: DC | PRN
  Administered 2023-09-21: 100 ug/kg/min via INTRAVENOUS

## 2023-09-21 MED ORDER — SODIUM CHLORIDE 0.9 % IV SOLN: INTRAVENOUS | Status: DC

## 2023-09-21 MED ORDER — HEPARIN (PORCINE) IN NACL 2000-0.9 UNIT/L-% IV SOLN
INTRAVENOUS | Status: DC | PRN
  Administered 2023-09-21: 1000 mL

## 2023-09-21 MED ORDER — LABETALOL HCL 5 MG/ML IV SOLN
INTRAVENOUS | Status: DC | PRN
  Administered 2023-09-21 (×2): 7.5 mg via INTRAVENOUS

## 2023-09-21 MED ORDER — PROPOFOL 10 MG/ML IV BOLUS
INTRAVENOUS | Status: DC | PRN
  Administered 2023-09-21: 40 mg via INTRAVENOUS
  Administered 2023-09-21: 110 mg via INTRAVENOUS

## 2023-09-21 MED ORDER — HEPARIN SODIUM (PORCINE) 1000 UNIT/ML IJ SOLN
INTRAMUSCULAR | Status: DC | PRN
  Administered 2023-09-21: 15000 [IU] via INTRAVENOUS

## 2023-09-21 MED ORDER — ROCURONIUM BROMIDE 10 MG/ML (PF) SYRINGE
PREFILLED_SYRINGE | INTRAVENOUS | Status: DC | PRN
  Administered 2023-09-21: 60 mg via INTRAVENOUS
  Administered 2023-09-21: 10 mg via INTRAVENOUS

## 2023-09-21 MED ORDER — FENTANYL CITRATE (PF) 100 MCG/2ML IJ SOLN
INTRAMUSCULAR | Status: AC
  Filled 2023-09-21: qty 2

## 2023-09-21 MED ORDER — ONDANSETRON HCL 4 MG/2ML IJ SOLN
INTRAMUSCULAR | Status: DC | PRN
  Administered 2023-09-21: 4 mg via INTRAVENOUS

## 2023-09-21 MED ORDER — CEFAZOLIN SODIUM-DEXTROSE 2-4 GM/100ML-% IV SOLN
2.0000 g | INTRAVENOUS | Status: AC
  Administered 2023-09-21: 2 g via INTRAVENOUS
  Filled 2023-09-21: qty 100

## 2023-09-21 MED ORDER — CLEVIDIPINE BUTYRATE 0.5 MG/ML IV EMUL
INTRAVENOUS | Status: DC | PRN
  Administered 2023-09-21: 7 mg/h via INTRAVENOUS

## 2023-09-21 MED ORDER — ACETAMINOPHEN 500 MG PO TABS
1000.0000 mg | ORAL_TABLET | Freq: Once | ORAL | Status: AC
  Administered 2023-09-21: 1000 mg via ORAL
  Filled 2023-09-21: qty 2

## 2023-09-21 MED ORDER — HYDRALAZINE HCL 20 MG/ML IJ SOLN
INTRAMUSCULAR | Status: DC | PRN
  Administered 2023-09-21 (×3): 2 mg via INTRAVENOUS

## 2023-09-21 MED ORDER — HEPARIN (PORCINE) IN NACL 1000-0.9 UT/500ML-% IV SOLN: INTRAVENOUS | Status: DC | PRN

## 2023-09-21 MED ORDER — SUGAMMADEX SODIUM 200 MG/2ML IV SOLN
INTRAVENOUS | Status: DC | PRN
  Administered 2023-09-21: 200 mg via INTRAVENOUS

## 2023-09-21 MED ORDER — ACETAMINOPHEN 325 MG PO TABS
ORAL_TABLET | ORAL | Status: AC
  Administered 2023-09-21: 650 mg via ORAL
  Filled 2023-09-21: qty 2

## 2023-09-21 MED ORDER — ONDANSETRON HCL 4 MG/2ML IJ SOLN: 4.0000 mg | Freq: Four times a day (QID) | INTRAMUSCULAR | Status: DC | PRN

## 2023-09-21 MED ORDER — CHLORHEXIDINE GLUCONATE 0.12 % MT SOLN
OROMUCOSAL | Status: AC
  Administered 2023-09-21: 15 mL via OROMUCOSAL
  Filled 2023-09-21: qty 15

## 2023-09-21 MED ORDER — PROTAMINE SULFATE 10 MG/ML IV SOLN
INTRAVENOUS | Status: DC | PRN
  Administered 2023-09-21: 20 mg via INTRAVENOUS
  Administered 2023-09-21: 10 mg via INTRAVENOUS

## 2023-09-21 MED ORDER — IODIXANOL 320 MG/ML IV SOLN
INTRAVENOUS | Status: DC | PRN
  Administered 2023-09-21: 25 mL

## 2023-09-21 MED ORDER — FENTANYL CITRATE (PF) 250 MCG/5ML IJ SOLN
INTRAMUSCULAR | Status: DC | PRN
  Administered 2023-09-21 (×2): 100 ug via INTRAVENOUS

## 2023-09-21 MED ORDER — CHLORHEXIDINE GLUCONATE 0.12 % MT SOLN
15.0000 mL | Freq: Once | OROMUCOSAL | Status: AC
  Filled 2023-09-21: qty 15

## 2023-09-21 SURGICAL SUPPLY — 19 items
BLANKET WARM UNDERBOD FULL ACC (MISCELLANEOUS) ×1 IMPLANT
CATH INFINITI 5FR ANG PIGTAIL (CATHETERS) IMPLANT
CATH WATCHMAN STEER ACCESS SYS (CATHETERS) IMPLANT
CLOSURE PERCLOSE PROSTYLE (VASCULAR PRODUCTS) IMPLANT
DEVICE WATCHMAN FLX PRO PROC (KITS) IMPLANT
DEVICE WATCHMAN TRUSTEER PROC (KITS) IMPLANT
DILATOR VESSEL 38 20CM 11FR (INTRODUCER) IMPLANT
KIT HEART LEFT (KITS) ×1 IMPLANT
KIT SHEA VERSACROSS LAAC CONNE (KITS) IMPLANT
PACK CARDIAC CATHETERIZATION (CUSTOM PROCEDURE TRAY) ×1 IMPLANT
PAD DEFIB RADIO PHYSIO CONN (PAD) ×1 IMPLANT
SHEATH PERFORMER 18FRX30 (VASCULAR PRODUCTS) IMPLANT
SHEATH PINNACLE 8F 10CM (SHEATH) IMPLANT
SHEATH PROBE COVER 6X72 (BAG) ×1 IMPLANT
TRANSDUCER W/STOPCOCK (MISCELLANEOUS) ×1 IMPLANT
TUBING CIL FLEX 10 FLL-RA (TUBING) ×1 IMPLANT
WATCHMAN FLX PRO 35 (Prosthesis & Implant Heart) IMPLANT
WATCHMAN FLX PRO PROCEDURE (KITS) ×1 IMPLANT
WATCHMAN TRUSTEER PROCEDURE (KITS) ×1 IMPLANT

## 2023-09-21 NOTE — Progress Notes (Signed)
Went over AVS paperwork with patient, answered any/all questions, walked with patient around unit with no complications, removed iv, gathered patient belongings and patient was wheeled out to spouses' vehicle.

## 2023-09-21 NOTE — Anesthesia Procedure Notes (Signed)
Procedure Name: Intubation Date/Time: 09/21/2023 7:58 AM  Performed by: Ayesha Rumpf, CRNAPre-anesthesia Checklist: Patient identified, Emergency Drugs available, Suction available and Patient being monitored Patient Re-evaluated:Patient Re-evaluated prior to induction Oxygen Delivery Method: Circle System Utilized Preoxygenation: Pre-oxygenation with 100% oxygen Induction Type: IV induction Ventilation: Mask ventilation without difficulty and Oral airway inserted - appropriate to patient size Laryngoscope Size: Glidescope and 3 Grade View: Grade II Tube type: Oral Tube size: 7.0 mm Number of attempts: 1 Airway Equipment and Method: Stylet and Oral airway Placement Confirmation: ETT inserted through vocal cords under direct vision, positive ETCO2 and breath sounds checked- equal and bilateral Secured at: 21 cm Tube secured with: Tape Dental Injury: Teeth and Oropharynx as per pre-operative assessment  Difficulty Due To: Difficult Airway- due to reduced neck mobility

## 2023-09-21 NOTE — Transfer of Care (Signed)
Immediate Anesthesia Transfer of Care Note  Patient: Lindsey Jordan  Procedure(s) Performed: LEFT ATRIAL APPENDAGE OCCLUSION TRANSESOPHAGEAL ECHOCARDIOGRAM  Patient Location: PACU and Cath Lab  Anesthesia Type:General  Level of Consciousness: awake, drowsy, patient cooperative, and responds to stimulation  Airway & Oxygen Therapy: Patient Spontanous Breathing and Patient connected to face mask oxygen  Post-op Assessment: Report given to RN, Post -op Vital signs reviewed and stable, and Patient moving all extremities X 4  Post vital signs: Reviewed and stable  Last Vitals:  Vitals Value Taken Time  BP    Temp    Pulse    Resp    SpO2      Last Pain:  Vitals:   09/21/23 0649  TempSrc:   PainSc: 0-No pain         Complications: There were no known notable events for this encounter.

## 2023-09-21 NOTE — Anesthesia Procedure Notes (Addendum)
Arterial Line Insertion Start/End12/19/2024 7:28 AM, 09/21/2023 7:35 AM Performed by: Gaynelle Adu, MD, Ayesha Rumpf, CRNA, CRNA  Patient location: Pre-op. Preanesthetic checklist: patient identified, IV checked, site marked, risks and benefits discussed, surgical consent, monitors and equipment checked, pre-op evaluation, timeout performed and anesthesia consent Lidocaine 1% used for infiltration Left, radial was placed Catheter size: 20 G Hand hygiene performed , maximum sterile barriers used  and Seldinger technique used Allen's test indicative of satisfactory collateral circulation Attempts: 1 Procedure performed using ultrasound guided technique. Ultrasound Notes:anatomy identified, needle tip was noted to be adjacent to the nerve/plexus identified and no ultrasound evidence of intravascular and/or intraneural injection Following insertion, dressing applied. Post procedure assessment: normal and unchanged

## 2023-09-21 NOTE — Discharge Summary (Signed)
[facility]    Patient ID: Lindsey Jordan,  MRN: [id number], DOB/AGE: [birth year] 72 y.o.  Admit date: 09/21/2023 Discharge date: 09/21/2023  Primary Care Physician: Merri Brunette, MD  Primary Cardiologist: Donato Schultz, MD  Electrophysiologist: Lanier Prude, MD  Primary Discharge Diagnosis:  Paroxysmal Atrial Fibrillation Poor candidacy for long term anticoagulation due to h/o GI bleeding  Procedures This Admission:  Transeptal Puncture Intra-procedural TEE which showed no LAA thrombus Left atrial appendage occlusive device placement on 09/21/2023 by Dr. Lalla Brothers.   This study demonstrated:  CONCLUSIONS:  1.Successful implantation of a WATCHMAN left atrial appendage occlusive device    2. TEE demonstrating no LAA thrombus 3. No early apparent complications.    Post Implant Anticoagulation Strategy: Continue Xarelto 20mg  by mouth once daily for 45 days after implant. After 45 days, stop Xarelto and start Plavix 75mg  by mouth once daily to complete 6 months of post implant medical therapy. Plan for CT scan 60 days after implant to assess Watchman position and appendage patency.   Brief HPI: Lindsey Jordan is a 72 y.o. female with a history of moderate non-obstructive CAD, HTN, obesity, DM2, and paroxsymal atrial fibrillation s/p remote ablation and GI bleeding with symptomatic anemia secondary to anticoagulation who was referred for LAAO with Watchman to mitigate long term AC for stroke prevention.   [name] is followed by Dr. Anne Fu for her cardiology care. She was initially diagnosed with AF 10/2020 after presenting with symptoms of tachy-palpitations and was ultimately referred to EP. She underwent  successful  AF ablation in 04/2021 with Dr. Johney Frame and was continued on anticoagulation. He began having issues with anemia of unknown etiology and was followed by hematology. More recently she was admitted 11/2022 with acute GI bleeding. EGD and capsule  endoscopy showed several non-bleeding AVMs. Once cleared, she was restarted on Xarelto and referred for LAAO consideration.   She was seen by Dr. Lalla Brothers 08/02/23 and was felt to be a good candidate and pre-procedure CT showed anatomy suitable to proceed.   Hospital Course:  The patient was admitted and underwent left atrial appendage occlusive device placement with Watchman FLX Pro 35mm device.  She was monitored in the post procedure setting and has done very well with no concerns. Given this, she is being considered for same day discharge later today. Groin site has been stable without evidence of hematoma or bleeding. Wound care and restrictions were reviewed with the patient. The patient has been scheduled for post procedure follow up with Georgie Chard, NP in approximately 1 month. She will restart Xarelto this evening and continue for 45 days then stop. At that time she will transition to Plavix 75mg  daily to complete 6 months of therapy. She will require dental SBE during this time and should refrain from dental work or cleanings for at least 30 days post implant. SBE to be RXd at follow up. A repeat CT will be performed in approximately 60 days to ensure proper seal of the device.   Physical Exam: Vitals:   09/21/23 1245 09/21/23 1300 09/21/23 1315 09/21/23 1330  BP: (!) 107/53 (!) 122/52 (!) 114/51 118/60  Pulse: 82 78 80 81  Resp: 14 13 14 14   Temp:      TempSrc:      SpO2: 91% 91% 91% 92%  Weight:      Height:       Labs:   Lab Results  Component Value Date   WBC 7.9 09/06/2023  HGB 10.8 (L) 09/06/2023   HCT 36.7 09/06/2023   MCV 75 (L) 09/06/2023   PLT 322 09/06/2023   No results for input(s): "NA", "K", "CL", "CO2", "BUN", "CREATININE", "CALCIUM", "PROT", "BILITOT", "ALKPHOS", "ALT", "AST", "GLUCOSE" in the last 168 hours.  Invalid input(s): "LABALBU"   Discharge Medications:  Allergies as of 09/21/2023       Reactions   Clarithromycin Other (See Comments)    Extreme stomach discomfort Other Reaction(s): Not available, stomach pain   Statins    Severe muscle aches with Lipitor (1998) and Crestor (2008)   Welchol [colesevelam Hcl]    Side effects - muscle and GI   Welchol [colesevelam] Other (See Comments)        Medication List     TAKE these medications    acetaminophen 500 MG tablet Commonly known as: TYLENOL Take 1,000 mg by mouth at bedtime.   acyclovir 200 MG capsule Commonly known as: ZOVIRAX Take 200 mg by mouth daily.   amitriptyline 10 MG tablet Commonly known as: ELAVIL Take 5 mg by mouth at bedtime as needed for sleep.   clonazePAM 1 MG tablet Commonly known as: KLONOPIN Take 1 mg by mouth at bedtime.   diphenhydrAMINE 25 MG tablet Commonly known as: BENADRYL Take 50 mg by mouth at bedtime as needed for allergies or sleep.   escitalopram 10 MG tablet Commonly known as: LEXAPRO Take 10 mg by mouth at bedtime.   fluticasone 50 MCG/ACT nasal spray Commonly known as: FLONASE Place 2 sprays into both nostrils daily.   metoprolol tartrate 50 MG tablet Commonly known as: LOPRESSOR Take 1 tablet (50 mg total) by mouth 2 (two) times daily.   Mounjaro 2.5 MG/0.5ML Pen Generic drug: tirzepatide Inject 2.5 mg into the skin once a week.   nitroGLYCERIN 0.4 MG SL tablet Commonly known as: NITROSTAT PLACE 1 TABLET UNDER THE TONGUE EVERY 5 MINUTES X 3 DOSES AS NEEDED FOR CHEST PAIN.   Repatha SureClick 140 MG/ML Soaj Generic drug: Evolocumab INJECT 1 PEN INTO THE SKIN EVERY 14 (FOURTEEN) DAYS.   triamterene-hydrochlorothiazide 37.5-25 MG tablet Commonly known as: MAXZIDE-25 Take 1 tablet by mouth daily.   Xarelto 20 MG Tabs tablet Generic drug: rivaroxaban TAKE 1 TABLET BY MOUTH DAILY WITH SUPPER Notes to patient: Restart Xarelto this evening 09/21/23       Disposition:  [facility]  Discharge Instructions     Call MD for:  difficulty breathing, headache or visual disturbances   Complete by: As directed     Call MD for:  extreme fatigue   Complete by: As directed    Call MD for:  hives   Complete by: As directed    Call MD for:  persistant dizziness or light-headedness   Complete by: As directed    Call MD for:  persistant nausea and vomiting   Complete by: As directed    Call MD for:  redness, tenderness, or signs of infection (pain, swelling, redness, odor or green/yellow discharge around incision site)   Complete by: As directed    Call MD for:  severe uncontrolled pain   Complete by: As directed    Call MD for:  temperature >100.4   Complete by: As directed    Diet - low sodium heart healthy   Complete by: As directed    Discharge instructions   Complete by: As directed    [facility] Procedure, Care After  Procedure MD: Dr. Isidoro Donning Clinical Coordinator: Karsten Fells, RN  This sheet gives you  information about how to care for yourself after your procedure. Your health care provider may also give you more specific instructions. If you have problems or questions, contact your health care provider.  What can I expect after the procedure? After the procedure, it is common to have: Bruising around your puncture site. Tenderness around your puncture site. Tiredness (fatigue).  Medication instructions It is very important to continue to take your blood thinner as directed by your doctor after the Watchman procedure. Call your procedure doctor's office with question or concerns. If you are on Coumadin (warfarin), you will have your INR checked the week after your procedure, with a goal INR of 2.0 - 3.0. Please follow your medication instructions on your discharge summary. Only take the medications listed on your discharge paperwork.  Follow up You will be seen in 1 month after your procedure You will have a repeat CT scan approximately 8 weeks after your procedure mark to check your device You will follow up the MD/APP who performed your procedure 6 months after your  procedure The Watchman Clinical Coordinator will check in with you from time to time, including 1 and 2 years after your procedure.    Follow these instructions at home: Puncture site care  Follow instructions from your health care provider about how to take care of your puncture site. Make sure you: If present, leave stitches (sutures), skin glue, or adhesive strips in place.  If a large square bandage is present, this may be removed 24 hours after surgery.  Check your puncture site every day for signs of infection. Check for: Redness, swelling, or pain. Fluid or blood. If your puncture site starts to bleed, lie down on your back, apply firm pressure to the area, and contact your health care provider. Warmth. Pus or a bad smell. Driving Do not drive yourself home if you received sedation Do not drive for at least 4 days after your procedure or however long your health care provider recommends. (Do not resume driving if you have previously been instructed not to drive for other health reasons.) Do not spend greater than 1 hour at a time in a car for the first 3 days. Stop and take a break with a 5 minute walk at least every hour.  Do not drive or use heavy machinery while taking prescription pain medicine.  Activity Avoid activities that take a lot of effort, including exercise, for at least 7 days after your procedure. For the first 3 days, avoid sitting for longer than one hour at a time.  Avoid alcoholic beverages, signing paperwork, or participating in legal proceedings for 24 hours after receiving sedation Do not lift anything that is heavier than 10 lb (4.5 kg) for one week.  No sexual activity for 1 week.  Return to your normal activities as told by your health care provider. Ask your health care provider what activities are safe for you. General instructions Take over-the-counter and prescription medicines only as told by your health care provider. Do not use any products that  contain nicotine or tobacco, such as cigarettes and e-cigarettes. If you need help quitting, ask your health care provider. You may shower after 24 hours, but Do not take baths, swim, or use a hot tub for 1 week.  Do not drink alcohol for 24 hours after your procedure. Keep all follow-up visits as told by your health care provider. This is important. Dental Work: You will require antibiotics prior to any dental work, including cleanings,  for 6 months after your Watchman implantation to help protect you from infection. After 6 months, antibiotics are no longer required. Contact a health care provider if: You have redness, mild swelling, or pain around your puncture site. You have soreness in your throat or at your puncture site that does not improve after several days You have fluid or blood coming from your puncture site that stops after applying firm pressure to the area. Your puncture site feels warm to the touch. You have pus or a bad smell coming from your puncture site. You have a fever. You have chest pain or discomfort that spreads to your neck, jaw, or arm. You are sweating a lot. You feel nauseous. You have a fast or irregular heartbeat. You have shortness of breath. You are dizzy or light-headed and feel the need to lie down. You have pain or numbness in the arm or leg closest to your puncture site. Get help right away if: Your puncture site suddenly swells. Your puncture site is bleeding and the bleeding does not stop after applying firm pressure to the area. These symptoms may represent a serious problem that is an emergency. Do not wait to see if the symptoms will go away. Get medical help right away. Call your local emergency services (911 in the [place] Do not drive yourself to the hospital. Summary After the procedure, it is normal to have bruising and tenderness at the puncture site in your groin, neck, or forearm. Check your puncture site every day for signs of  infection. Get help right away if your puncture site is bleeding and the bleeding does not stop after applying firm pressure to the area. This is a medical emergency.  This information is not intended to replace advice given to you by your health care provider. Make sure you discuss any questions you have with your health care provider.   Increase activity slowly   Complete by: As directed        Follow-up Information     Filbert Schilder, NP Follow up on 10/30/2023.   Specialty: Cardiology Why: Your follow up appointment will be at 8:40AM. Please arrive by 8:30AM Contact information: [address] [place] [address] [phone]                 Duration of Discharge Encounter: Greater than 30 minutes including physician time.  Signed, Georgie Chard, NP  09/21/2023 2:10 PM

## 2023-09-21 NOTE — Discharge Instructions (Signed)

## 2023-09-21 NOTE — Progress Notes (Signed)
  [facility]   WATCHMAN TEAM  Patient doing well s/p LAAO. She is hemodynamically stable. Groin site is stable. Plan to transfer to from cath lab holding to floor when bed available. Early ambulation after bedrest completed and hopeful discharge later this afternoon.   Georgie Chard NP-C Structural Heart Team  Phone: [phone]

## 2023-09-21 NOTE — Progress Notes (Signed)
LOCATION: left RADIAL a-line removed  DRESSING APPLIED:  gauze with co-band  SITE UPON ARRIVAL: LEVEL 0-  SITE AFTER BAND REMOVAL: LEVEL0  CIRCULATION SENSATION AND MOVEMENT: + movement, +2 radial pulse noted  COMMENTS: A-line removed by Mamie Laurel., RN manual pressure applied for approximately 10 minutes

## 2023-09-21 NOTE — Anesthesia Postprocedure Evaluation (Signed)
Anesthesia Post Note  Patient: Lindsey Jordan  Procedure(s) Performed: LEFT ATRIAL APPENDAGE OCCLUSION TRANSESOPHAGEAL ECHOCARDIOGRAM     Patient location during evaluation: Cath Lab Anesthesia Type: General Level of consciousness: awake and alert Pain management: pain level controlled Vital Signs Assessment: post-procedure vital signs reviewed and stable Respiratory status: spontaneous breathing, nonlabored ventilation and respiratory function stable Cardiovascular status: blood pressure returned to baseline and stable Postop Assessment: no apparent nausea or vomiting Anesthetic complications: no  There were no known notable events for this encounter.  Last Vitals:  Vitals:   09/21/23 1030 09/21/23 1115  BP: (!) 143/56 (!) 130/54  Pulse: 81 82  Resp: 11 15  Temp:    SpO2: 91% 91%    Last Pain:  Vitals:   09/21/23 1030  TempSrc:   PainSc: 3                  Mellisa Arshad,W. EDMOND

## 2023-09-21 NOTE — H&P (Signed)
Electrophysiology Office Note:     Date:  09/21/2023    ID:  Lindsey Jordan, DOB 17-Jul-1951, MRN [id number]   [facility] Cardiologist:  Donato Schultz, MD  [facility] Electrophysiologist:  Lanier Prude, MD    Referring MD: Merri Brunette, MD    Chief Complaint: AF   History of Present Illness:         Discussed the use of AI scribe software for clinical note transcription with the patient, who gave verbal consent to proceed.   History of Present Illness   Miss Lindsey Jordan, a 72 year old woman with a history of coronary artery disease, atrial fibrillation, anemia, and a history of AVMs and GI blood loss, presents for evaluation of atrial fibrillation and discussion of stroke risk mitigation strategies. She is currently on Xarelto for stroke prophylaxis. She had a prior catheter ablation in July of 2022, which has been successful. She had an episode of anemia earlier this year. The cause of the anemia was initially unclear, but after discharge was postulated to be secondary to metformin, which she has since discontinued. She reports that her stomach distress has resolved since stopping the metformin. She has been studying the Watchman procedure and is considering it as a potential option for stroke risk mitigation. She also mentions that she is starting on Mounjaro for type 2 diabetes and is considering knee replacement surgery in the future.          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 Procedure reviewed.       Objective Their past medical, social and family history was reveiwed.     ROS:   Please see the history of present illness.    All other systems reviewed and are negative.   EKGs/Labs/Other Studies Reviewed:     The following studies were reviewed today:   January 18, 2023 ZIO monitor No atrial fibrillation Average heart rate 78 Rare supraventricular and ventricular ectopy   December 23, 2022 echo EF 55 to 60% RV mildly reduced Trivial MR               Physical Exam:     VS:  BP 143/49   Pulse 80   Ht 5\' 7"  (1.702 m)   Wt 254 lb (115.2 kg)   LMP  (LMP Unknown)   SpO2 95%   BMI 39.78 kg/m         Wt Readings from Last 3 Encounters:  08/02/23 254 lb (115.2 kg)  04/11/23 240 lb (108.9 kg)  03/13/23 231 lb 8 oz (105 kg)      GEN:  Well nourished, well developed in no acute distress CARDIAC: RRR, no murmurs, rubs, gallops RESPIRATORY:  Clear to auscultation without rales, wheezing or rhonchi      Assessment ASSESSMENT AND PLAN:     1. Paroxysmal atrial fibrillation (HCC)   2. 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 Essential hypertension               Assessment and Plan    Atrial Fibrillation History of pulmonary vein isolation ablation in 2022. Currently on Xarelto for stroke prophylaxis. CHADS-VASc score of 4. Discussed the risk of GI bleeding on Xarelto given history of AVMs and anemia. Also discussed the Watchman procedure as an alternative for stroke risk mitigation. -Continue Xarelto for now. -Initiate evaluation for Watchman procedure per patient's preference.   Hypertension Well controlled on metoprolol and diltiazem. -Continue metoprolol and diltiazem. -Continue home blood pressure monitoring.  Anemia Recent episode of anemia, possibly related to metformin use.  -Monitor for recurrence of anemia. - Watchman as above       ------------------------   I have seen Lindsey Jordan in the office today who is being considered for a Watchman left atrial appendage closure device. I believe they will benefit from this procedure given their history of atrial fibrillation, CHA2DS2-VASc score of 4. Unfortunately, the patient is not felt to be a long term anticoagulation candidate secondary to hx of GI bleeding/anemia. The patient's chart has been reviewed and I feel that they would be a candidate for short term oral anticoagulation after Watchman implant.    It is  my belief that after undergoing a LAA closure procedure, Lindsey Jordan will not need long term anticoagulation which eliminates anticoagulation side effects and major bleeding risk.    Procedural risks for the Watchman implant have been reviewed with the patient including a 0.5% risk of stroke, <1% risk of perforation and <1% risk of device embolization. Other risks include bleeding, vascular damage, tamponade, worsening renal function, and death. The patient understands these risk and wishes to proceed.       The published clinical data on the safety and effectiveness of WATCHMAN include but are not limited to the following: - Holmes DR, Everlene Farrier, Sick P et al. for the PROTECT AF Investigators. Percutaneous closure of the left atrial appendage versus warfarin therapy for prevention of stroke in patients with atrial fibrillation: a randomised non-inferiority trial. Lancet 2009; 374: 534-42. Everlene Farrier, Doshi SK, Isa Rankin D et al. on behalf of the PROTECT AF Investigators. Percutaneous Left Atrial Appendage Closure for Stroke Prophylaxis in Patients With Atrial Fibrillation 2.3-Year Follow-up of the PROTECT AF (Watchman Left Atrial Appendage System for Embolic Protection in Patients With Atrial Fibrillation) Trial. Circulation 2013; 127:720-729. - Alli O, Doshi S,  Kar S, Reddy VY, Sievert H et al. Quality of Life Assessment in the Randomized PROTECT AF (Percutaneous Closure of the Left Atrial Appendage Versus Warfarin Therapy for Prevention of Stroke in Patients With Atrial Fibrillation) Trial of Patients at Risk for Stroke With Nonvalvular Atrial Fibrillation. J Am Coll Cardiol 2013; 61:1790-8. Aline August DR, Mia [place], Price M, Whisenant B, Sievert H, Doshi S, Huber K, Reddy V. Prospective randomized evaluation of the Watchman left atrial appendage Device in patients with atrial fibrillation versus long-term warfarin therapy; the PREVAIL trial. Journal of the [employer] of Cardiology, Vol. 4,  No. 1, 2014, 1-11. - Kar S, Doshi SK, Sadhu A, Horton R, Osorio J et al. Primary outcome evaluation of a next-generation left atrial appendage closure device: results from the PINNACLE FLX trial. Circulation 2021;143(18)1754-1762.      After today's visit with the patient which was dedicated solely for shared decision making visit regarding LAA closure device, the patient decided to proceed with the LAA appendage closure procedure scheduled to be done in the near future at [facility]. Prior to the procedure, I would like to obtain a gated CT scan of the chest with contrast timed for PV/LA visualization.      HAS-BLED score 3 Hypertension Yes  Abnormal renal and liver function (Dialysis, transplant, Cr >2.26 mg/dL /Cirrhosis or Bilirubin >2x Normal or AST/ALT/AP >3x Normal) No  Stroke No  Bleeding Yes  Labile INR (Unstable/high INR) No  Elderly (>65) Yes  Drugs or alcohol (>= 8 drinks/week, anti-plt or NSAID) No    CHA2DS2-VASc Score = 4  The patient's score is based  upon: CHF History: 0 HTN History: 1 Diabetes History: 1 Stroke History: 0 Vascular Disease History: 0 Age Score: 1 Gender Score: 1          Presents for LAAO today. Procedure reviewed.           Signed, Rossie Muskrat. Lalla Brothers, MD, [facility], [facility] 08/02/2023 8:16 PM    Electrophysiology [facility]

## 2023-09-22 ENCOUNTER — Telehealth: Payer: Self-pay | Admitting: Cardiology

## 2023-09-22 ENCOUNTER — Encounter (HOSPITAL_COMMUNITY): Payer: Self-pay | Admitting: Cardiology

## 2023-09-22 MED FILL — Fentanyl Citrate Preservative Free (PF) Inj 100 MCG/2ML: INTRAMUSCULAR | Qty: 2 | Status: AC

## 2023-09-22 NOTE — Telephone Encounter (Signed)
  [facility]   Watchman Team  Attempted to contact the patient regarding discharge from [facility] on 09/21/23 after LAAO with no answer. LVM to return call for update.    Georgie Chard NP-C Structural Heart Team  Phone: [phone]

## 2023-09-25 NOTE — Telephone Encounter (Signed)
Left message to call back  

## 2023-09-26 NOTE — Telephone Encounter (Signed)
The patient called back. Reported she feels great, groins look good, and confirmed follow-up appointment. She will call if she needs anything prior to visit.

## 2023-10-04 DIAGNOSIS — E119 Type 2 diabetes mellitus without complications: Secondary | ICD-10-CM | POA: Diagnosis not present

## 2023-10-24 ENCOUNTER — Encounter: Payer: Self-pay | Admitting: Cardiology

## 2023-10-25 MED ORDER — RIVAROXABAN 20 MG PO TABS: 20.0000 mg | ORAL_TABLET | Freq: Every day | ORAL | Status: DC

## 2023-10-25 NOTE — Progress Notes (Signed)
[facility]                                     Cardiology Office Note:    Date:  10/30/2023   ID:  Lindsey Jordan, DOB 10/23/1950, MRN [id number]  PCP:  Merri Brunette, MD  [facility] Cardiologist:  Donato Schultz, MD  [facility] Electrophysiologist:  Lanier Prude, MD   Referring MD: Merri Brunette, MD   Chief Complaint  Patient presents with   s/p LAAO    History of Present Illness:    Lindsey Jordan is a 73 y.o. female with a hx of moderate non-obstructive CAD, HTN, obesity, DM2, and paroxsymal atrial fibrillation s/p remote ablation and GI bleeding with symptomatic anemia secondary to anticoagulation who is now s/p LAAO and is here today for follow up.    Lindsey Jordan is followed by Dr. Anne Fu for her cardiology care. She was initially diagnosed with AF 10/2020 after presenting with symptoms of tachy-palpitations and was ultimately referred to EP. She underwent  successful  AF ablation in 04/2021 with Dr. Johney Frame and was continued on anticoagulation. He began having issues with anemia of unknown etiology and was followed by hematology. More recently she was admitted 11/2022 with acute GI bleeding. EGD and capsule endoscopy showed several non-bleeding AVMs. Once cleared, she was restarted on Xarelto and referred for LAAO consideration.    She was seen by Dr. Lalla Brothers 08/02/23 and was felt to be a good candidate and pre-procedure CT showed anatomy suitable to proceed and is now s/p LAAO with Watchman FLX Pro 35mm device. She was restarted on Xarelto and will continue 45 days then stop. At that time she will transition to Plavix 75mg  daily to complete 6 months of therapy. She will require dental SBE during this time and should refrain from dental work or cleanings for at least 30 days post implant. SBE to be RXd at follow up. A repeat CT will be performed in approximately 60 days to ensure proper seal of the device.   Today she reports that she is doing well. She is  having more fatigue and is concerned about recurrent anemia. Plan to check CBC today. Otherwise she  denies chest pain, SOB, palpitations, LE edema, orthopnea, PND, dizziness, or syncope. Denies bleeding in stool or urine.    Past Medical History:  Diagnosis Date   Diabetes mellitus without complication (HCC)    Hypertension    Kidney stones    Morbid obesity (HCC)    Paroxysmal atrial fibrillation (HCC)    PONV (postoperative nausea and vomiting)    Presence of Watchman left atrial appendage closure device 09/21/2023   35mm Watchman FLX Pro device placed by Dr. Lalla Brothers    Past Surgical History:  Procedure Laterality Date   ABDOMINAL HYSTERECTOMY  2003   ADENOIDECTOMY     age 76   ATRIAL FIBRILLATION ABLATION N/A 04/06/2021   Procedure: ATRIAL FIBRILLATION ABLATION;  Surgeon: Hillis Range, MD;  Location: [facility] INVASIVE CV LAB;  Service: Cardiovascular;  Laterality: N/A;   BREAST LUMPECTOMY     x 2 in her 20's   BREAST SURGERY     CESAREAN SECTION     one previous   ECTOPIC PREGNANCY SURGERY     ESOPHAGOGASTRODUODENOSCOPY (EGD) WITH PROPOFOL N/A 12/03/2022   Procedure: ESOPHAGOGASTRODUODENOSCOPY (EGD) WITH PROPOFOL;  Surgeon: Charlott Rakes, MD;  Location: [facility] ENDOSCOPY;  Service: Gastroenterology;  Laterality: N/A;   GIVENS CAPSULE STUDY N/A 12/03/2022   Procedure: GIVENS CAPSULE STUDY;  Surgeon: Charlott Rakes, MD;  Location: [facility] ENDOSCOPY;  Service: Gastroenterology;  Laterality: N/A;   HEMORRHOID SURGERY     in 20's   LEFT ATRIAL APPENDAGE OCCLUSION N/A 09/21/2023   Procedure: LEFT ATRIAL APPENDAGE OCCLUSION;  Surgeon: Lanier Prude, MD;  Location: [facility];  Service: Cardiovascular;  Laterality: N/A;   LEFT HEART CATH AND CORONARY ANGIOGRAPHY N/A 11/23/2020   Procedure: LEFT HEART CATH AND CORONARY ANGIOGRAPHY;  Surgeon: Lyn Records, MD;  Location: [facility];  Service: Cardiovascular;  Laterality: N/A;   TEE WITHOUT CARDIOVERSION N/A 04/06/2021    Procedure: TRANSESOPHAGEAL ECHOCARDIOGRAM (TEE);  Surgeon: Sande Rives, MD;  Location: [facility] ENDOSCOPY;  Service: Cardiovascular;  Laterality: N/A;   TONSILLECTOMY     TRANSESOPHAGEAL ECHOCARDIOGRAM (CATH LAB) N/A 09/21/2023   Procedure: TRANSESOPHAGEAL ECHOCARDIOGRAM;  Surgeon: Lanier Prude, MD;  Location: [facility] INVASIVE CV LAB;  Service: Cardiovascular;  Laterality: N/A;    Current Medications: Current Meds  Medication Sig   acetaminophen (TYLENOL) 500 MG tablet Take 1,000 mg by mouth at bedtime.   acyclovir (ZOVIRAX) 200 MG capsule Take 200 mg by mouth daily.   amitriptyline (ELAVIL) 10 MG tablet Take 5 mg by mouth at bedtime as needed for sleep.   amoxicillin (AMOXIL) 500 MG tablet TAKE 4 TABLETS BY MOUTH 1 HOUR PRIOR TO DENTAL WORK   clonazePAM (KLONOPIN) 1 MG tablet Take 1 mg by mouth at bedtime.   [START ON 11/06/2023] clopidogrel (PLAVIX) 75 MG tablet Take 1 tablet (75 mg total) by mouth daily.   diphenhydrAMINE (BENADRYL) 25 MG tablet Take 50 mg by mouth at bedtime as needed for allergies or sleep.   escitalopram (LEXAPRO) 10 MG tablet Take 10 mg by mouth at bedtime.   Evolocumab (REPATHA SURECLICK) 140 MG/ML SOAJ INJECT 1 PEN INTO THE SKIN EVERY 14 (FOURTEEN) DAYS.   fluticasone (FLONASE) 50 MCG/ACT nasal spray Place 2 sprays into both nostrils daily.   metoprolol tartrate (LOPRESSOR) 50 MG tablet Take 1 tablet (50 mg total) by mouth 2 (two) times daily.   nitroGLYCERIN (NITROSTAT) 0.4 MG SL tablet PLACE 1 TABLET UNDER THE TONGUE EVERY 5 MINUTES X 3 DOSES AS NEEDED FOR CHEST PAIN.   Semaglutide,0.25 or 0.5MG /DOS, (OZEMPIC, 0.25 OR 0.5 MG/DOSE,) 2 MG/3ML SOPN Inject 2.5 mg into the skin once a week.   triamterene-hydrochlorothiazide (MAXZIDE-25) 37.5-25 MG tablet Take 1 tablet by mouth daily.   XARELTO 20 MG TABS tablet TAKE 1 TABLET BY MOUTH DAILY WITH SUPPER     Allergies:   Clarithromycin, Statins, Welchol [colesevelam hcl], and Welchol [colesevelam]   Social History    Socioeconomic History   Marital status: Married    Spouse name: Not on file   Number of children: Not on file   Years of education: Not on file   Highest education level: Not on file  Occupational History   Not on file  Tobacco Use   Smoking status: Never   Smokeless tobacco: Never  Vaping Use   Vaping status: Never Used  Substance and Sexual Activity   Alcohol use: No   Drug use: No   Sexual activity: Yes    Birth control/protection: Surgical  Other Topics Concern   Not on file  Social History Narrative   Not on file   Social Drivers of Health   Financial Resource Strain: Not on file  Food Insecurity: No Food Insecurity (09/21/2023)  Hunger Vital Sign    Worried About Running Out of Food in the Last Year: Never true    Ran Out of Food in the Last Year: Never true  Transportation Needs: No Transportation Needs (09/21/2023)   PRAPARE - Administrator, Civil Service (Medical): No    Lack of Transportation (Non-Medical): No  Physical Activity: Not on file  Stress: Not on file  Social Connections: Not on file     Family History: The patient's family history includes Breast cancer in her maternal aunt; Hypertension in her father.  ROS:   Please see the history of present illness.    All other systems reviewed and are negative.  EKGs/Labs/Other Studies Reviewed:    The following studies were reviewed today:   Cardiac Studies & Procedures   CARDIAC CATHETERIZATION  CARDIAC CATHETERIZATION 11/23/2020  Narrative  1st Diag lesion is 40% stenosed.  Ost LAD to Mid LAD lesion is 40% stenosed.   Total occlusion of the distal third of the second obtuse marginal.  The marginal has left to left and right to left collaterals.  Small first obtuse marginal with diffuse disease.  The proximal to mid circumflex contains minimal luminal irregularities.  Mild to moderate proximal to mid RCA diffuse disease.  Mid PDA 80 to 90% focal stenosis.  RCA is a dominant  vessel with 2 other left ventricular branches.  Fills the distal obtuse marginal by collaterals.  Left main is widely patent  Segmental proximal to mid 40 to 50% LAD.  Large first diagonal contains proximal to mid diffuse 30% narrowing.  Normal LV function with EF 60%.  LVEDP is normal.  RECOMMENDATIONS:   Beta-blocker therapy  Aggressive risk factor control.  Consider early SGLT2 therapy for diabetes.  If symptomatic angina, PDA, relatively small in diameter could be stented however dual antiplatelet therapy/long-term anticoagulation increases risk of bleeding.  In absence of angina when in sinus rhythm with moderate to heavy physical activity, seems that medical therapy is best treatment option currently.  Would use low-dose aspirin in conjunction with anticoagulation, perhaps 81 mg 3 times daily.  Findings Coronary Findings Diagnostic  Dominance: Right  Left Anterior Descending There is mild diffuse disease throughout the vessel. Ost LAD to Mid LAD lesion is 40% stenosed.  First Diagonal Branch 1st Diag lesion is 40% stenosed.  Left Circumflex The vessel exhibits minimal luminal irregularities.  First Obtuse Marginal Branch Vessel is small in size. 1st Mrg lesion is 50% stenosed.  Second Obtuse Marginal Branch Collaterals 2nd Mrg filled by collaterals from 2nd Diag.  Collaterals 2nd Mrg filled by collaterals from 2nd RPL.  2nd Mrg lesion is 100% stenosed.  Right Coronary Artery The vessel exhibits minimal luminal irregularities. Prox RCA to Mid RCA lesion is 40% stenosed.  Right Posterior Descending Artery RPDA lesion is 90% stenosed.  Intervention  No interventions have been documented.   STRESS TESTS  MYOCARDIAL PERFUSION IMAGING 11/17/2020  Narrative  The left ventricular ejection fraction is hyperdynamic (>65%).  Nuclear stress EF: 68%.  There was no ST segment deviation noted during stress.  No T wave inversion was noted during stress.   Defect 1: There is a large defect of moderate severity present in the basal inferolateral, basal anterolateral, mid inferolateral, mid anterolateral and apical lateral location.  Findings consistent with prior myocardial infarction with peri-infarct ischemia in the left circumflex territory.  This is a high risk study.  ECHOCARDIOGRAM  ECHOCARDIOGRAM COMPLETE 12/22/2022  Narrative ECHOCARDIOGRAM REPORT    Patient  Name:   Lindsey Jordan Date of Exam: 12/22/2022 Medical Rec #:  [id number]        Height:       67.0 in Accession #:    [id number]       Weight:       244.9 lb Date of Birth:  1950-11-08        BSA:          2.204 m Patient Age:    71 years         BP:           126/64 mmHg Patient Gender: F                HR:           87 bpm. Exam Location:  Outpatient  Procedure: 2D Echo, 3D Echo, Color Doppler, Cardiac Doppler and Strain Analysis  Indications:    Atrial Fibrillation  History:        Patient has prior history of Echocardiogram examinations, most recent 04/06/2021. Arrythmias:Atrial Fibrillation; Risk Factors:Diabetes, Dyslipidemia and Non-Smoker.  Sonographer:    Jeryl Columbia RDCS Referring Phys: [id number] JAYADEEP S VARANASI  IMPRESSIONS   1. Left ventricular ejection fraction, by estimation, is 55 to 60%. Left ventricular ejection fraction by 3D volume is 55 %. The left ventricle has normal function. The left ventricle has no regional wall motion abnormalities. There is mild left ventricular hypertrophy. Left ventricular diastolic parameters are indeterminate. 2. Right ventricular systolic function is mildly reduced. The right ventricular size is normal. There is normal pulmonary artery systolic pressure. The estimated right ventricular systolic pressure is 27.0 mmHg. 3. The mitral valve is grossly normal. Trivial mitral valve regurgitation. No evidence of mitral stenosis. 4. The aortic valve is tricuspid. There is mild calcification of the aortic valve. There is mild  thickening of the aortic valve. Aortic valve regurgitation is not visualized. Aortic valve sclerosis/calcification is present, without any evidence of aortic stenosis. 5. The inferior vena cava is normal in size with greater than 50% respiratory variability, suggesting right atrial pressure of 3 mmHg.  FINDINGS Left Ventricle: Left ventricular ejection fraction, by estimation, is 55 to 60%. Left ventricular ejection fraction by 3D volume is 55 %. The left ventricle has normal function. The left ventricle has no regional wall motion abnormalities. Global longitudinal strain performed but not reported based on interpreter judgement due to suboptimal tracking. The left ventricular internal cavity size was normal in size. There is mild left ventricular hypertrophy. Left ventricular diastolic parameters are indeterminate.  Right Ventricle: The right ventricular size is normal. No increase in right ventricular wall thickness. Right ventricular systolic function is mildly reduced. There is normal pulmonary artery systolic pressure. The tricuspid regurgitant velocity is 2.45 m/s, and with an assumed right atrial pressure of 3 mmHg, the estimated right ventricular systolic pressure is 27.0 mmHg.  Left Atrium: Left atrial size was normal in size.  Right Atrium: Right atrial size was normal in size.  Pericardium: There is no evidence of pericardial effusion.  Mitral Valve: The mitral valve is grossly normal. Trivial mitral valve regurgitation. No evidence of mitral valve stenosis.  Tricuspid Valve: The tricuspid valve is normal in structure. Tricuspid valve regurgitation is trivial. No evidence of tricuspid stenosis.  Aortic Valve: The aortic valve is tricuspid. There is mild calcification of the aortic valve. There is mild thickening of the aortic valve. Aortic valve regurgitation is not visualized. Aortic valve sclerosis/calcification is present, without any evidence of aortic stenosis.  Pulmonic  Valve: The pulmonic valve was normal in structure. Pulmonic valve regurgitation is trivial. No evidence of pulmonic stenosis.  Aorta: The aortic root is normal in size and structure.  Venous: The inferior vena cava is normal in size with greater than 50% respiratory variability, suggesting right atrial pressure of 3 mmHg.  IAS/Shunts: No atrial level shunt detected by color flow Doppler.  Additional Comments: There is a small pleural effusion in the left lateral region.   LEFT VENTRICLE PLAX 2D LVIDd:         3.76 cm         Diastology LVIDs:         2.73 cm         LV e' medial:    4.68 cm/s LV PW:         1.10 cm         LV E/e' medial:  13.2 LV IVS:        1.10 cm         LV e' lateral:   8.38 cm/s LVOT diam:     2.10 cm         LV E/e' lateral: 7.4 LV SV:         54 LV SV Index:   24 LVOT Area:     3.46 cm        3D Volume EF LV 3D EF:    Left ventricul ar ejection fraction by 3D volume is 55 %.  3D Volume EF: 3D EF:        55 % LV EDV:       90 ml LV ESV:       41 ml LV SV:        50 ml  RIGHT VENTRICLE RV Basal diam:  4.19 cm RV Mid diam:    3.34 cm RV S prime:     15.70 cm/s TAPSE (M-mode): 2.3 cm  LEFT ATRIUM           Index        RIGHT ATRIUM           Index LA diam:      4.60 cm 2.09 cm/m   RA Area:     13.90 cm LA Vol (A2C): 20.9 ml 9.48 ml/m   RA Volume:   36.70 ml  16.65 ml/m LA Vol (A4C): 73.1 ml 33.16 ml/m AORTIC VALVE LVOT Vmax:   84.40 cm/s LVOT Vmean:  54.000 cm/s LVOT VTI:    0.155 m  AORTA Ao Root diam: 2.70 cm Ao Asc diam:  3.40 cm  MITRAL VALVE               TRICUSPID VALVE MV Area (PHT): 4.31 cm    TR Peak grad:   24.0 mmHg MV Decel Time: 176 msec    TR Vmax:        245.00 cm/s MR Peak grad: 28.1 mmHg MR Vmax:      265.00 cm/s  SHUNTS MV E velocity: 61.80 cm/s  Systemic VTI:  0.16 m MV A velocity: 66.00 cm/s  Systemic Diam: 2.10 cm MV E/A ratio:  0.94  Weston Brass MD Electronically signed by Weston Brass  MD Signature Date/Time: 12/23/2022/10:33:31 AM    Final  TEE  ECHO TEE 09/21/2023  Narrative TRANSESOPHOGEAL ECHO REPORT    Patient Name:   Lindsey Jordan Befort Date of Exam: 09/21/2023 Medical Rec #:  [id number]        Height:  67.0 in Accession #:    [id number]       Weight:       233.0 lb Date of Birth:  1951-01-24        BSA:          2.158 m Patient Age:    72 years         BP:           143/49 mmHg Patient Gender: F                HR:           80 bpm. Exam Location:  Inpatient  Procedure: Transesophageal Echo, 3D Echo, Color Doppler and Cardiac Doppler  Indications:     I48.1 Persistent atrial fibrillation  History:         Patient has prior history of Echocardiogram examinations, most recent 12/23/2022. Arrythmias:Atrial Fibrillation; Risk Factors:Dyslipidemia and Diabetes.  Sonographer:     Irving Burton Senior RDCS Referring Phys:  [id number] Lanier Prude Diagnosing Phys: Riley Lam MD   Sonographer Comments: 35mm Watchman FLX implanted   PROCEDURE: After discussion of the risks and benefits of a TEE, an informed consent was obtained from the patient. The transesophogeal probe was passed without difficulty through the esophogus of the patient. Sedation performed by different physician. The patient was monitored while under deep sedation. The patient developed no complications during the procedure.  IMPRESSIONS   1. Prior to procedure- patient left atrial appendage. Largest diameter 30 mm (3D assessment). A mid posterior transeptal was performed. Placement of a 35 mm Watchman FLX Pro. There was no peri-device leak. There was no significant mitral shoulder. Average compression 27%. No post procedural effusion. Shunting is all left to right.. Left atrial size was moderately dilated. No left atrial/left atrial appendage thrombus was detected. 2. Left ventricular ejection fraction, by estimation, is 55 to 60%. The left ventricle has normal function. 3. Right  ventricular systolic function is normal. The right ventricular size is normal. 4. Right atrial size was moderately dilated. 5. The mitral valve is degenerative. Mild mitral valve regurgitation. No evidence of mitral stenosis. 6. The aortic valve is tricuspid. Aortic valve regurgitation is mild. Aortic valve sclerosis is present, with no evidence of aortic valve stenosis. 7. Evidence of atrial level shunting detected by color flow Doppler.  FINDINGS Left Ventricle: Left ventricular ejection fraction, by estimation, is 55 to 60%. The left ventricle has normal function. The left ventricular internal cavity size was normal in size.  Right Ventricle: The right ventricular size is normal. No increase in right ventricular wall thickness. Right ventricular systolic function is normal.  Left Atrium: Prior to procedure- patient left atrial appendage. Largest diameter 30 mm (3D assessment). A mid posterior transeptal was performed. Placement of a 35 mm Watchman FLX Pro. There was no peri-device leak. There was no significant mitral shoulder. Average compression 27%. No post procedural effusion. Shunting is all left to right. Left atrial size was moderately dilated. No left atrial/left atrial appendage thrombus was detected.  Right Atrium: Right atrial size was moderately dilated. Prominent Eustachian valve.  Pericardium: There is no evidence of pericardial effusion.  Mitral Valve: The mitral valve is degenerative in appearance. Mild mitral valve regurgitation. No evidence of mitral valve stenosis.  Tricuspid Valve: The tricuspid valve is normal in structure. Tricuspid valve regurgitation is trivial. No evidence of tricuspid stenosis.  Aortic Valve: The aortic valve is tricuspid. Aortic valve regurgitation is mild. Aortic valve sclerosis is present, with no evidence  of aortic valve stenosis.  Pulmonic Valve: The pulmonic valve was not well visualized. Pulmonic valve regurgitation is not visualized. No  evidence of pulmonic stenosis.  Aorta: The aortic root, ascending aorta, aortic arch and descending aorta are all structurally normal, with no evidence of dilitation or obstruction.  IAS/Shunts: Evidence of atrial level shunting detected by color flow Doppler.  Additional Comments: Spectral Doppler performed.  Riley Lam MD Electronically signed by Riley Lam MD Signature Date/Time: 09/21/2023/1:01:05 PM    Final  MONITORS  LONG TERM MONITOR (3-14 DAYS) 01/18/2023  Narrative   Sinus rhythm with average heart rate of 78 bpm   Rare PVCs and PACs   No atrial fibrillation   Reassuring monitor   Patch Wear Time:  12 days and 22 hours (2024-03-27T14:43:59-0400 to 2024-04-09T13:22:57-0400)  Patient had a min HR of 57 bpm, max HR of 133 bpm, and avg HR of 78 bpm. Predominant underlying rhythm was Sinus Rhythm. 1 run of Supraventricular Tachycardia occurred lasting 4 beats with a max rate of 128 bpm (avg 126 bpm). Isolated SVEs were rare (<1.0%), SVE Couplets were rare (<1.0%), and SVE Triplets were rare (<1.0%). Isolated VEs were rare (<1.0%), VE Couplets were rare (<1.0%), and no VE Triplets were present. Ventricular Trigeminy was present.           EKG:  EKG is not ordered today.   Recent Labs: 01/05/2023: ALT 33; TSH 1.273 09/06/2023: BUN 12; Creatinine, Ser 0.94; Hemoglobin 10.8; Platelets 322; Potassium 4.1; Sodium 139  Recent Lipid Panel    Component Value Date/Time   CHOL 161 02/01/2021 1031   TRIG 231 (H) 02/01/2021 1031   HDL 48 02/01/2021 1031   CHOLHDL 3.4 02/01/2021 1031   CHOLHDL 3.7 09/14/2015 0852   VLDL 32 (H) 09/14/2015 0852   LDLCALC 75 02/01/2021 1031   LDLDIRECT 86.0 10/15/2014 0743    Risk Assessment/Calculations:    CHA2DS2-VASc Score = 4 [CHF History: 0, HTN History: 1, Diabetes History: 1, Stroke History: 0, Vascular Disease History: 0, Age Score: 1, Gender Score: 1].  Therefore, the patient's annual risk of stroke is 4.8 %.       Physical Exam:    VS:  BP (!) 120/50   Pulse 72   Ht 5\' 7"  (1.702 m)   Wt 235 lb 3.2 oz (106.7 kg)   LMP  (LMP Unknown)   SpO2 98%   BMI 36.84 kg/m     Wt Readings from Last 3 Encounters:  10/30/23 235 lb 3.2 oz (106.7 kg)  09/21/23 233 lb (105.7 kg)  09/06/23 236 lb 6.4 oz (107.2 kg)    General: Well developed, well nourished, NAD Lungs:Clear to ausculation bilaterally. No wheezes, rales, or rhonchi. Breathing is unlabored. Cardiovascular: RRR with S1 S2. No murmurs Extremities: No edema.  Neuro: Alert and oriented. No focal deficits. No facial asymmetry. MAE spontaneously. Psych: Responds to questions appropriately with normal affect.    ASSESSMENT:    1. PAF (paroxysmal atrial fibrillation) (HCC)   2. Pre-procedure lab exam    PLAN:    PAF: s/p successful LAAO with 35mm Watchman Pro FLX device on 09/21/23. Continue Xarelto until 2/2 then stop. On 2/3 she will start Plavix 75mg  daily and continue for 6 months. Ct instructions reviewed with understanding. CBC added to assess for anemia per patient request. SBE with Amoxicillin sent to preferred pharmacy. Plan 6 month post LAAO follow up with or team.  Moderate non-obstructive CAD: Plan to start ASA 81mg  after completion of post LAAO therapy.  HTN: Stable with no changes today.   Hx of anemia: Prior transfusions and iron infusions. Check CBC today.   I spent 20 minutes caring for this patient today including face-to-face discussions, ordering and reviewing labs, reviewing records from [facility] and other outside facilities, documenting in the record, and arranging for follow up.    Medication Adjustments/Labs and Tests Ordered: Current medicines are reviewed at length with the patient today.  Concerns regarding medicines are outlined above.  Orders Placed This Encounter  Procedures   Basic Metabolic Panel (BMET)   CBC   Meds ordered this encounter  Medications   amoxicillin (AMOXIL) 500 MG tablet    Sig: TAKE 4  TABLETS BY MOUTH 1 HOUR PRIOR TO DENTAL WORK    Dispense:  8 tablet    Refill:  0    PT WILL CALL WHEN SHE NEEDS .Marland Kitchen PLEASE PUT ON HOLD   clopidogrel (PLAVIX) 75 MG tablet    Sig: Take 1 tablet (75 mg total) by mouth daily.    Dispense:  90 tablet    Refill:  3    PT WILL STOP XARELTO 2/2 AND WILL NEED TO START 2/3    Patient Instructions  Medication Instructions:  Your physician recommends that you continue on your current medications as directed. Please refer to the Current Medication list given to you today.   11/05/23 WILL BE YOUR LAST DOSE OF XARELTO 11/06/23  START PLAVIX 75 MG TAKING 1 DAILY  *If you need a refill on your cardiac medications before your next appointment, please call your pharmacy*   Lab Work: TODAY: BMET   If you have labs (blood work) drawn today and your tests are completely normal, you will receive your results only by: MyChart Message (if you have MyChart) OR A paper copy in the mail If you have any lab test that is abnormal or we need to change your treatment, we will call you to review the results.   Testing/Procedures: None ordered   Follow-Up: At [facility], you and your health needs are our priority.  As part of our continuing mission to provide you with exceptional heart care, we have created designated Provider Care Teams.  These Care Teams include your primary Cardiologist (physician) and Advanced Practice Providers (APPs -  Physician Assistants and Nurse Practitioners) who all work together to provide you with the care you need, when you need it.  We recommend signing up for the patient portal called "MyChart".  Sign up information is provided on this After Visit Summary.  MyChart is used to connect with patients for Virtual Visits (Telemedicine).  Patients are able to view lab/test results, encounter notes, upcoming appointments, etc.  Non-urgent messages can be sent to your provider as well.   To learn more about what you can do with  MyChart, go to [email or web address].    Your next appointment:   Will call   Provider:   Georgie Chard, NP or Carlean Jews, PA-C    Other Instructions   1st Floor: - Lobby - Registration  - Pharmacy  - Lab - Cafe  2nd Floor: - PV Lab - Diagnostic Testing (echo, CT, nuclear med)  3rd Floor: - Vacant  4th Floor: - TCTS (cardiothoracic surgery) - AFib Clinic - Structural Heart Clinic - Vascular Surgery  - Vascular Ultrasound  5th Floor: - HeartCare Cardiology (general and EP) - Clinical Pharmacy for coumadin, hypertension, lipid, weight-loss medications, and med management appointments    Valet parking services will be  available as well.           Signed, Georgie Chard, NP  10/30/2023 9:19 AM    [facility]

## 2023-10-26 DIAGNOSIS — Z85828 Personal history of other malignant neoplasm of skin: Secondary | ICD-10-CM | POA: Diagnosis not present

## 2023-10-26 DIAGNOSIS — L738 Other specified follicular disorders: Secondary | ICD-10-CM | POA: Diagnosis not present

## 2023-10-26 DIAGNOSIS — D1801 Hemangioma of skin and subcutaneous tissue: Secondary | ICD-10-CM | POA: Diagnosis not present

## 2023-10-26 DIAGNOSIS — L570 Actinic keratosis: Secondary | ICD-10-CM | POA: Diagnosis not present

## 2023-10-26 DIAGNOSIS — L280 Lichen simplex chronicus: Secondary | ICD-10-CM | POA: Diagnosis not present

## 2023-10-30 ENCOUNTER — Ambulatory Visit: Payer: Medicare Other | Attending: Cardiology | Admitting: Cardiology

## 2023-10-30 ENCOUNTER — Other Ambulatory Visit: Payer: Self-pay | Admitting: Cardiology

## 2023-10-30 VITALS — BP 120/50 | HR 72 | Ht 67.0 in | Wt 235.2 lb

## 2023-10-30 DIAGNOSIS — R931 Abnormal findings on diagnostic imaging of heart and coronary circulation: Secondary | ICD-10-CM

## 2023-10-30 DIAGNOSIS — I10 Essential (primary) hypertension: Secondary | ICD-10-CM | POA: Diagnosis not present

## 2023-10-30 DIAGNOSIS — I480 Paroxysmal atrial fibrillation: Secondary | ICD-10-CM

## 2023-10-30 DIAGNOSIS — Z01812 Encounter for preprocedural laboratory examination: Secondary | ICD-10-CM | POA: Diagnosis not present

## 2023-10-30 DIAGNOSIS — Z95818 Presence of other cardiac implants and grafts: Secondary | ICD-10-CM

## 2023-10-30 DIAGNOSIS — D6869 Other thrombophilia: Secondary | ICD-10-CM

## 2023-10-30 LAB — CBC

## 2023-10-30 MED ORDER — AMOXICILLIN 500 MG PO TABS: ORAL_TABLET | ORAL | 0 refills | Status: AC

## 2023-10-30 MED ORDER — CLOPIDOGREL BISULFATE 75 MG PO TABS: 75.0000 mg | ORAL_TABLET | Freq: Every day | ORAL | 3 refills | Status: DC

## 2023-10-30 NOTE — Patient Instructions (Addendum)
Medication Instructions:  Your physician has recommended you make the following change in your medication:    11/05/23 WILL BE YOUR LAST DOSE OF XARELTO 2.   11/06/23  START PLAVIX 75 MG TAKING 1 DAILY 3.  START Amoxicillin 500 taking 4 tablets 1 hour prior to Dental Procedure  *If you need a refill on your cardiac medications before your next appointment, please call your pharmacy*   Lab Work: TODAY: BMET & CBC   If you have labs (blood work) drawn today and your tests are completely normal, you will receive your results only by: MyChart Message (if you have MyChart) OR A paper copy in the mail If you have any lab test that is abnormal or we need to change your treatment, we will call you to review the results.   Testing/Procedures: None ordered   Follow-Up: At [facility], you and your health needs are our priority.  As part of our continuing mission to provide you with exceptional heart care, we have created designated Provider Care Teams.  These Care Teams include your primary Cardiologist (physician) and Advanced Practice Providers (APPs -  Physician Assistants and Nurse Practitioners) who all work together to provide you with the care you need, when you need it.  We recommend signing up for the patient portal called "MyChart".  Sign up information is provided on this After Visit Summary.  MyChart is used to connect with patients for Virtual Visits (Telemedicine).  Patients are able to view lab/test results, encounter notes, upcoming appointments, etc.  Non-urgent messages can be sent to your provider as well.   To learn more about what you can do with MyChart, go to [email or web address].    Your next appointment:   6 MONTHS   Provider:   Georgie Chard, NP or Carlean Jews, PA-C    Other Instructions   1st Floor: - Lobby - Registration  - Pharmacy  - Lab - Cafe  2nd Floor: - PV Lab - Diagnostic Testing (echo, CT, nuclear med)  3rd Floor: -  Vacant  4th Floor: - TCTS (cardiothoracic surgery) - AFib Clinic - Structural Heart Clinic - Vascular Surgery  - Vascular Ultrasound  5th Floor: - HeartCare Cardiology (general and EP) - Clinical Pharmacy for coumadin, hypertension, lipid, weight-loss medications, and med management appointments    Valet parking services will be available as well.

## 2023-10-31 LAB — BASIC METABOLIC PANEL
BUN/Creatinine Ratio: 16 (ref 12–28)
BUN: 12 mg/dL (ref 8–27)
CO2: 22 mmol/L (ref 20–29)
Calcium: 9.3 mg/dL (ref 8.7–10.3)
Chloride: 99 mmol/L (ref 96–106)
Creatinine, Ser: 0.77 mg/dL (ref 0.57–1.00)
Glucose: 137 mg/dL — ABNORMAL HIGH (ref 70–99)
Potassium: 4 mmol/L (ref 3.5–5.2)
Sodium: 138 mmol/L (ref 134–144)
eGFR: 81 mL/min/{1.73_m2} (ref >59)

## 2023-10-31 LAB — CBC
Hematocrit: 33.2 % — ABNORMAL LOW (ref 34.0–46.6)
Hemoglobin: 9.8 g/dL — ABNORMAL LOW (ref 11.1–15.9)
MCH: 21.4 pg — ABNORMAL LOW (ref 26.6–33.0)
MCHC: 29.5 g/dL — ABNORMAL LOW (ref 31.5–35.7)
MCV: 73 fL — ABNORMAL LOW (ref 79–97)
Platelets: 339 10*3/uL (ref 150–450)
RBC: 4.58 x10E6/uL (ref 3.77–5.28)
RDW: 17.8 % — ABNORMAL HIGH (ref 11.7–15.4)
WBC: 7.2 10*3/uL (ref 3.4–10.8)

## 2023-11-04 DIAGNOSIS — E119 Type 2 diabetes mellitus without complications: Secondary | ICD-10-CM | POA: Diagnosis not present

## 2023-11-16 DIAGNOSIS — R197 Diarrhea, unspecified: Secondary | ICD-10-CM | POA: Diagnosis not present

## 2023-11-20 ENCOUNTER — Other Ambulatory Visit (HOSPITAL_COMMUNITY): Payer: Medicare Other

## 2023-11-20 ENCOUNTER — Ambulatory Visit (HOSPITAL_COMMUNITY)
Admission: RE | Admit: 2023-11-20 | Discharge: 2023-11-20 | Disposition: A | Discharge status: Home / Self Care | Payer: Medicare Other | Source: Ambulatory Visit | Attending: Family Medicine | Admitting: Family Medicine

## 2023-11-20 DIAGNOSIS — Z95818 Presence of other cardiac implants and grafts: Secondary | ICD-10-CM | POA: Diagnosis not present

## 2023-11-20 DIAGNOSIS — I480 Paroxysmal atrial fibrillation: Secondary | ICD-10-CM | POA: Diagnosis not present

## 2023-11-20 MED ORDER — IOHEXOL 350 MG/ML SOLN
95.0000 mL | Freq: Once | INTRAVENOUS | Status: AC | PRN
  Administered 2023-11-20: 95 mL via INTRAVENOUS

## 2023-11-22 ENCOUNTER — Encounter: Payer: Self-pay | Admitting: Cardiology

## 2023-11-27 ENCOUNTER — Telehealth: Payer: Self-pay

## 2023-11-27 NOTE — Telephone Encounter (Signed)
-----   Message from Lanier Prude sent at 11/22/2023  6:35 PM EST ----- CT reviewed. Small communication around the device into a pouch. Device is in excellent position. OK to continue with planned post implant medication regimen.  Sheria Lang T. Lalla Brothers, MD, [facility], [facility] Cardiac Electrophysiology

## 2023-11-27 NOTE — Telephone Encounter (Signed)
 Reviewed results with patient who verbalized understanding.   Confirmed she STOPPED XARELTO and STARTED PLAVIX as directed.  03/25/2024 visit cancelled. Will call her to review med changes and schedule follow-up with primary cardiology team. She was grateful for call and agreed with plan.

## 2023-12-02 DIAGNOSIS — E119 Type 2 diabetes mellitus without complications: Secondary | ICD-10-CM | POA: Diagnosis not present

## 2023-12-05 DIAGNOSIS — D649 Anemia, unspecified: Secondary | ICD-10-CM | POA: Diagnosis not present

## 2023-12-05 DIAGNOSIS — E1169 Type 2 diabetes mellitus with other specified complication: Secondary | ICD-10-CM | POA: Diagnosis not present

## 2023-12-05 DIAGNOSIS — F419 Anxiety disorder, unspecified: Secondary | ICD-10-CM | POA: Diagnosis not present

## 2023-12-05 DIAGNOSIS — E7800 Pure hypercholesterolemia, unspecified: Secondary | ICD-10-CM | POA: Diagnosis not present

## 2023-12-05 DIAGNOSIS — I10 Essential (primary) hypertension: Secondary | ICD-10-CM | POA: Diagnosis not present

## 2023-12-18 ENCOUNTER — Encounter (HOSPITAL_BASED_OUTPATIENT_CLINIC_OR_DEPARTMENT_OTHER): Payer: Self-pay | Admitting: Cardiology

## 2023-12-19 NOTE — Telephone Encounter (Signed)
 Spoke with patient, states she was possibly back into AF but she hasn't had an episode since her ablation with Dr Johney Frame in 2022. Patient states she had dizziness, shortness of breath, and her heart rate felt irregular. Currently has watchman device and hasn't missed any medication per pt (also on metoprolol). Patient denies any chest pain/discomfort but does states she took a nitroglycerin thinking it would help, educated on nitroglycerin use. Patient states her rate was well controlled during episode, 60-70's. Patient currently feels well and doesn't think she's in AF. Patient does have the capability to take an EKG on iwatch but didn't during her episode last night. Instructed patient, if this occurs again to try to get an EKG for her provider to review. No further needs at this time

## 2023-12-20 DIAGNOSIS — G4700 Insomnia, unspecified: Secondary | ICD-10-CM | POA: Diagnosis not present

## 2023-12-20 DIAGNOSIS — I4891 Unspecified atrial fibrillation: Secondary | ICD-10-CM | POA: Diagnosis not present

## 2023-12-20 DIAGNOSIS — F322 Major depressive disorder, single episode, severe without psychotic features: Secondary | ICD-10-CM | POA: Diagnosis not present

## 2023-12-20 DIAGNOSIS — G4721 Circadian rhythm sleep disorder, delayed sleep phase type: Secondary | ICD-10-CM | POA: Diagnosis not present

## 2024-01-02 DIAGNOSIS — E119 Type 2 diabetes mellitus without complications: Secondary | ICD-10-CM | POA: Diagnosis not present

## 2024-02-01 DIAGNOSIS — E119 Type 2 diabetes mellitus without complications: Secondary | ICD-10-CM | POA: Diagnosis not present

## 2024-03-21 ENCOUNTER — Telehealth: Payer: Self-pay

## 2024-03-21 NOTE — Telephone Encounter (Signed)
 Per Aleda Hurl McDaniel's post-LAAO note,  PAF: s/p successful LAAO with 35mm Watchman Pro FLX device on 09/21/23. Continue Xarelto  until 2/2 then stop. On 2/3 she will start Plavix  75mg  daily and continue for 6 months. Ct instructions reviewed with understanding. CBC added to assess for anemia per patient request. SBE with Amoxicillin  sent to preferred pharmacy. Plan 6 month post LAAO follow up with or team.   Moderate non-obstructive CAD: Plan to start ASA 81mg  after completion of post LAAO therapy.    Left message for the patient to call back. Will stop Plavix  and start ASA 81 mg daily.

## 2024-03-22 NOTE — Telephone Encounter (Signed)
 Left message to call back

## 2024-03-22 NOTE — Telephone Encounter (Signed)
 Pt returning call, requesting cb

## 2024-03-25 ENCOUNTER — Ambulatory Visit: Payer: Medicare Other | Admitting: Physician Assistant

## 2024-03-25 MED ORDER — ASPIRIN 81 MG PO TBEC: 81.0000 mg | DELAYED_RELEASE_TABLET | Freq: Every day | ORAL | Status: AC

## 2024-03-25 NOTE — Telephone Encounter (Signed)
 See 03/25/2024 MyChart messages.

## 2024-04-01 DIAGNOSIS — E669 Obesity, unspecified: Secondary | ICD-10-CM | POA: Diagnosis not present

## 2024-04-01 DIAGNOSIS — E119 Type 2 diabetes mellitus without complications: Secondary | ICD-10-CM | POA: Diagnosis not present

## 2024-04-01 DIAGNOSIS — E1169 Type 2 diabetes mellitus with other specified complication: Secondary | ICD-10-CM | POA: Diagnosis not present

## 2024-04-01 DIAGNOSIS — E7800 Pure hypercholesterolemia, unspecified: Secondary | ICD-10-CM | POA: Diagnosis not present

## 2024-04-02 DIAGNOSIS — E119 Type 2 diabetes mellitus without complications: Secondary | ICD-10-CM | POA: Diagnosis not present

## 2024-04-04 ENCOUNTER — Other Ambulatory Visit (HOSPITAL_BASED_OUTPATIENT_CLINIC_OR_DEPARTMENT_OTHER): Payer: Self-pay | Admitting: Cardiology

## 2024-04-14 ENCOUNTER — Emergency Department (HOSPITAL_BASED_OUTPATIENT_CLINIC_OR_DEPARTMENT_OTHER)

## 2024-04-14 ENCOUNTER — Emergency Department (HOSPITAL_BASED_OUTPATIENT_CLINIC_OR_DEPARTMENT_OTHER)
Admission: EM | Admit: 2024-04-14 | Discharge: 2024-04-14 | Disposition: A | Discharge status: Home / Self Care | Source: Ambulatory Visit | Attending: Emergency Medicine | Admitting: Emergency Medicine

## 2024-04-14 ENCOUNTER — Encounter (HOSPITAL_BASED_OUTPATIENT_CLINIC_OR_DEPARTMENT_OTHER): Payer: Self-pay

## 2024-04-14 DIAGNOSIS — K5900 Constipation, unspecified: Secondary | ICD-10-CM | POA: Diagnosis not present

## 2024-04-14 DIAGNOSIS — Z7982 Long term (current) use of aspirin: Secondary | ICD-10-CM | POA: Insufficient documentation

## 2024-04-14 DIAGNOSIS — E860 Dehydration: Secondary | ICD-10-CM | POA: Insufficient documentation

## 2024-04-14 DIAGNOSIS — E876 Hypokalemia: Secondary | ICD-10-CM | POA: Diagnosis not present

## 2024-04-14 DIAGNOSIS — D649 Anemia, unspecified: Secondary | ICD-10-CM | POA: Insufficient documentation

## 2024-04-14 DIAGNOSIS — R109 Unspecified abdominal pain: Secondary | ICD-10-CM | POA: Diagnosis not present

## 2024-04-14 DIAGNOSIS — D1803 Hemangioma of intra-abdominal structures: Secondary | ICD-10-CM | POA: Diagnosis not present

## 2024-04-14 DIAGNOSIS — R399 Unspecified symptoms and signs involving the genitourinary system: Secondary | ICD-10-CM | POA: Diagnosis not present

## 2024-04-14 DIAGNOSIS — R1031 Right lower quadrant pain: Secondary | ICD-10-CM | POA: Diagnosis not present

## 2024-04-14 DIAGNOSIS — N200 Calculus of kidney: Secondary | ICD-10-CM | POA: Diagnosis not present

## 2024-04-14 DIAGNOSIS — R1084 Generalized abdominal pain: Secondary | ICD-10-CM

## 2024-04-14 LAB — COMPREHENSIVE METABOLIC PANEL WITH GFR
ALT: 13 U/L (ref 0–44)
AST: 16 U/L (ref 15–41)
Albumin: 3.9 g/dL (ref 3.5–5.0)
Alkaline Phosphatase: 134 U/L — ABNORMAL HIGH (ref 38–126)
Anion gap: 13 (ref 5–15)
BUN: 13 mg/dL (ref 8–23)
CO2: 24 mmol/L (ref 22–32)
Calcium: 9.6 mg/dL (ref 8.9–10.3)
Chloride: 98 mmol/L (ref 98–111)
Creatinine, Ser: 0.82 mg/dL (ref 0.44–1.00)
GFR, Estimated: >60 mL/min (ref >60)
Glucose, Bld: 122 mg/dL — ABNORMAL HIGH (ref 70–99)
Potassium: 3.4 mmol/L — ABNORMAL LOW (ref 3.5–5.1)
Sodium: 135 mmol/L (ref 135–145)
Total Bilirubin: 0.5 mg/dL (ref 0.0–1.2)
Total Protein: 7 g/dL (ref 6.5–8.1)

## 2024-04-14 LAB — CBC
HCT: 36.4 % (ref 36.0–46.0)
Hemoglobin: 11 g/dL — ABNORMAL LOW (ref 12.0–15.0)
MCH: 22.3 pg — ABNORMAL LOW (ref 26.0–34.0)
MCHC: 30.2 g/dL (ref 30.0–36.0)
MCV: 73.8 fL — ABNORMAL LOW (ref 80.0–100.0)
Platelets: 288 K/uL (ref 150–400)
RBC: 4.93 MIL/uL (ref 3.87–5.11)
RDW: 19.9 % — ABNORMAL HIGH (ref 11.5–15.5)
WBC: 6.9 K/uL (ref 4.0–10.5)
nRBC: 0 % (ref 0.0–0.2)

## 2024-04-14 LAB — URINALYSIS, ROUTINE W REFLEX MICROSCOPIC
Bilirubin Urine: NEGATIVE
Glucose, UA: NEGATIVE mg/dL
Hgb urine dipstick: NEGATIVE
Ketones, ur: NEGATIVE mg/dL
Leukocytes,Ua: NEGATIVE
Nitrite: NEGATIVE
Specific Gravity, Urine: 1.025 (ref 1.005–1.030)
pH: 6 (ref 5.0–8.0)

## 2024-04-14 LAB — LIPASE, BLOOD: Lipase: 29 U/L (ref 11–51)

## 2024-04-14 MED ORDER — DICYCLOMINE HCL 20 MG PO TABS: 20.0000 mg | ORAL_TABLET | Freq: Two times a day (BID) | ORAL | 0 refills | Status: AC | PRN

## 2024-04-14 MED ORDER — IOHEXOL 300 MG/ML  SOLN
100.0000 mL | Freq: Once | INTRAMUSCULAR | Status: AC | PRN
  Administered 2024-04-14: 100 mL via INTRAVENOUS

## 2024-04-14 MED ORDER — ONDANSETRON 4 MG PO TBDP: 4.0000 mg | ORAL_TABLET | Freq: Three times a day (TID) | ORAL | 0 refills | Status: AC | PRN

## 2024-04-14 MED ORDER — SODIUM CHLORIDE 0.9 % IV BOLUS
1000.0000 mL | Freq: Once | INTRAVENOUS | Status: AC
  Administered 2024-04-14: 1000 mL via INTRAVENOUS

## 2024-04-14 NOTE — ED Notes (Signed)
 Pt d/c instructions, medications, and follow-up care reviewed with pt. Pt verbalized understanding and had no further questions at time of d/c. Pt CA&Ox4, ambulatory, and in NAD at time of d/c

## 2024-04-14 NOTE — ED Provider Notes (Signed)
 [place] EMERGENCY DEPARTMENT AT [facility] Provider Note   CSN: [id number] Arrival date & time: 04/14/24  1058     Patient presents with: Abdominal Pain   Lindsey Jordan is a 73 y.o. female.   Patient with history of hysterectomy, no other abdominal surgeries, history of GI bleeding requiring transfusion, history of atrial fibrillation status post Watchman device placement, not currently on anticoagulation -- presents to the emergency department today for evaluation of generalized malaise and abdominal pain with decreased appetite over the past 5 days.  Patient was seen at [facility] walk-in clinic earlier today, sent to the emergency department for further evaluation.  Patient does report use of Ozempic, recent increase in dosage 2 weeks ago.  Patient feels a band of pain around her mid abdomen.  At walk-in clinic, they were concerned that pain was more right-sided.  No vomiting.  Patient has had constipation for 4 to 5 days.  She has had dark urine but no dysuria or hematuria.  UA performed at walk-in clinic was negative.         Prior to Admission medications   Medication Sig Start Date End Date Taking? Authorizing Provider  dicyclomine  (BENTYL ) 20 MG tablet Take 1 tablet (20 mg total) by mouth 2 (two) times daily as needed for spasms. 04/14/24  Yes Desiderio Chew, PA-C  ondansetron  (ZOFRAN -ODT) 4 MG disintegrating tablet Take 1 tablet (4 mg total) by mouth every 8 (eight) hours as needed for nausea or vomiting. 04/14/24  Yes Marieelena Bartko, PA-C  acetaminophen  (TYLENOL ) 500 MG tablet Take 1,000 mg by mouth at bedtime.    [provider]  acyclovir (ZOVIRAX) 200 MG capsule Take 200 mg by mouth daily.    [provider]  amitriptyline  (ELAVIL ) 10 MG tablet Take 5 mg by mouth at bedtime as needed for sleep.    [provider]  amoxicillin  (AMOXIL ) 500 MG tablet TAKE 4 TABLETS BY MOUTH 1 HOUR PRIOR TO DENTAL WORK 10/30/23   McDaniel, Jill D, NP  aspirin   EC 81 MG tablet Take 1 tablet (81 mg total) by mouth daily. Swallow whole. 03/25/24   Cindie Ole DASEN, MD  clonazePAM  (KLONOPIN ) 1 MG tablet Take 1 mg by mouth at bedtime.    [provider]  diphenhydrAMINE  (BENADRYL ) 25 MG tablet Take 50 mg by mouth at bedtime as needed for allergies or sleep.    [provider]  escitalopram  (LEXAPRO ) 10 MG tablet Take 10 mg by mouth at bedtime.    [provider]  Evolocumab  (REPATHA  SURECLICK) 140 MG/ML SOAJ INJECT 1 PEN INTO THE SKIN EVERY 14 (FOURTEEN) DAYS. 11/08/21   Jeffrie Oneil BROCKS, MD  fluticasone (FLONASE) 50 MCG/ACT nasal spray Place 2 sprays into both nostrils daily.    [provider]  metoprolol  tartrate (LOPRESSOR ) 50 MG tablet TAKE 1 TABLET BY MOUTH TWICE A DAY 04/04/24   Jeffrie Oneil BROCKS, MD  nitroGLYCERIN  (NITROSTAT ) 0.4 MG SL tablet PLACE 1 TABLET UNDER THE TONGUE EVERY 5 MINUTES X 3 DOSES AS NEEDED FOR CHEST PAIN. 01/06/21   Kelsie Agent, MD  Semaglutide,0.25 or 0.5MG /DOS, (OZEMPIC, 0.25 OR 0.5 MG/DOSE,) 2 MG/3ML SOPN Inject 2.5 mg into the skin once a week.    [provider]  triamterene-hydrochlorothiazide (MAXZIDE-25) 37.5-25 MG tablet Take 1 tablet by mouth daily.    [provider]    Allergies: Clarithromycin, Statins, Welchol [colesevelam hcl], and Welchol [colesevelam]    Review of Systems  Updated Vital Signs BP 137/65  Pulse 77   Temp 98.1 F (36.7 C) (Oral)   Resp 18   Ht 5' 7 (1.702 m)   Wt 103.9 kg   LMP  (LMP Unknown)   SpO2 100%   BMI 35.87 kg/m   Physical Exam Vitals and nursing note reviewed.  Constitutional:      General: She is not in acute distress.    Appearance: She is well-developed.  HENT:     Head: Normocephalic and atraumatic.     Right Ear: External ear normal.     Left Ear: External ear normal.     Nose: Nose normal.     Mouth/Throat:     Mouth: Mucous membranes are dry.     Comments: Dry mucous membranes noted. Eyes:     Conjunctiva/sclera:  Conjunctivae normal.  Cardiovascular:     Rate and Rhythm: Normal rate and regular rhythm.     Heart sounds: No murmur heard. Pulmonary:     Effort: No respiratory distress.     Breath sounds: No wheezing, rhonchi or rales.  Abdominal:     Palpations: Abdomen is soft.     Tenderness: There is generalized abdominal tenderness. There is no guarding or rebound. Negative signs include Murphy's sign and McBurney's sign.  Musculoskeletal:     Cervical back: Normal range of motion and neck supple.     Right lower leg: No edema.     Left lower leg: No edema.  Skin:    General: Skin is warm and dry.     Findings: No rash.  Neurological:     General: No focal deficit present.     Mental Status: She is alert. Mental status is at baseline.     Motor: No weakness.  Psychiatric:        Mood and Affect: Mood normal.     (all labs ordered are listed, but only abnormal results are displayed) Labs Reviewed  COMPREHENSIVE METABOLIC PANEL WITH GFR - Abnormal; Notable for the following components:      Result Value   Potassium 3.4 (*)    Glucose, Bld 122 (*)    Alkaline Phosphatase 134 (*)    All other components within normal limits  CBC - Abnormal; Notable for the following components:   Hemoglobin 11.0 (*)    MCV 73.8 (*)    MCH 22.3 (*)    RDW 19.9 (*)    All other components within normal limits  URINALYSIS, ROUTINE W REFLEX MICROSCOPIC - Abnormal; Notable for the following components:   APPearance HAZY (*)    Protein, ur TRACE (*)    Bacteria, UA MANY (*)    All other components within normal limits  LIPASE, BLOOD    EKG: None  Radiology: CT ABDOMEN PELVIS W CONTRAST Result Date: 04/14/2024 CLINICAL DATA:  Acute abdominal pain for 5 days. Right lower quadrant tenderness. Nausea and bloating. EXAM: CT ABDOMEN AND PELVIS WITH CONTRAST TECHNIQUE: Multidetector CT imaging of the abdomen and pelvis was performed using the standard protocol following bolus administration of  intravenous contrast. RADIATION DOSE REDUCTION: This exam was performed according to the departmental dose-optimization program which includes automated exposure control, adjustment of the mA and/or kV according to patient size and/or use of iterative reconstruction technique. CONTRAST:  OMNIPAQUE  IOHEXOL  300 MG/ML  SOLN COMPARISON:  12/01/2022 FINDINGS: Lower Chest: No acute findings. Hepatobiliary: 3 cm benign hemangioma again seen the posterior right hepatic lobe. No other liver lesions identified. Mild diffuse hepatic steatosis again noted. Gallbladder is unremarkable.  No evidence of biliary ductal dilatation. Pancreas:  No mass or inflammatory changes. Spleen: Within normal limits in size and appearance. Adrenals/Urinary Tract: No suspicious masses identified. 3 mm calculus again seen in lower pole of left kidney. No evidence of ureteral calculi or hydronephrosis. Unremarkable unopacified urinary bladder. Stomach/Bowel: No evidence of obstruction, inflammatory process or abnormal fluid collections. Normal appendix visualized. Vascular/Lymphatic: No pathologically enlarged lymph nodes. No acute vascular findings. Reproductive: Prior hysterectomy noted. Adnexal regions are unremarkable in appearance. Other:  None. Musculoskeletal:  No suspicious bone lesions identified. IMPRESSION: No evidence of appendicitis or other acute findings. Tiny nonobstructing left renal calculus. No evidence of ureteral calculi or hydronephrosis. Stable benign hepatic hemangioma and hepatic steatosis. Electronically Signed   By: Norleen DELENA Kil M.D.   On: 04/14/2024 12:38     Procedures   Medications Ordered in the ED  sodium chloride  0.9 % bolus 1,000 mL (1,000 mLs Intravenous New Bag/Given 04/14/24 1229)  iohexol  (OMNIPAQUE ) 300 MG/ML solution 100 mL (100 mLs Intravenous Contrast Given 04/14/24 1222)    ED Course  Patient seen and examined. History obtained directly from patient.   Labs/EKG: Ordered CBC, CMP, lipase,  UA.  Imaging: Ordered CT abdomen pelvis.  Medications/Fluids: Ordered: IV fluid bolus.   Most recent vital signs reviewed and are as follows: BP 137/65   Pulse 77   Temp 98.1 F (36.7 C) (Oral)   Resp 18   Ht 5' 7 (1.702 m)   Wt 103.9 kg   LMP  (LMP Unknown)   SpO2 100%   BMI 35.87 kg/m   Initial impression: Generalized abdominal pain and malaise over the past 5 days, will need to evaluate for serious intra-abdominal etiology.  It is possible that she has ileus from Ozempic use.  Will check labs, treat with IV fluids, perform CT imaging.  1:25 PM Reassessment performed. Patient appears stable, comfortable.  No worsening symptoms.  No vomiting.  Receiving IV fluids.  Husband at bedside.  Labs personally reviewed and interpreted including: CBC with normal white blood cell count, mild anemia hemoglobin 11.0 but improved from previous; CMP minimally low potassium at 3.4, glucose 122, alkaline phosphatase 134 otherwise unremarkable; lipase normal; UA with 6-10 white blood cells and many bacteria, not clean-catch, patient without significant urinary symptoms.  Imaging personally visualized and interpreted including: CT scan of the abdomen pelvis without acute findings, we did discuss benign liver hemangioma, renal stone.  I agree no acute findings of infection. Reviewed pertinent lab work and imaging with patient at bedside. Questions answered.  Patient does have moderate stool noted, redundant sigmoid colon.  Most current vital signs reviewed and are as follows: BP 137/65   Pulse 77   Temp 98.1 F (36.7 C) (Oral)   Resp 18   Ht 5' 7 (1.702 m)   Wt 103.9 kg   LMP  (LMP Unknown)   SpO2 100%   BMI 35.87 kg/m   Plan: Discharge to home.   Prescriptions written for: Zofran  and Bentyl  if desired for symptoms  Other home care instructions discussed: Over-the-counter MiraLAX for constipation if desired.  Encouraged patient to hold off on taking Ozempic dose today as planned.  I would  discuss with PCP and wait for GI symptoms to resolve prior to restarting this.  ED return instructions discussed: The patient was urged to return to the Emergency Department immediately with worsening of current symptoms, worsening abdominal pain, persistent vomiting, blood noted in stools, fever, or any other concerns. The patient verbalized understanding.  Follow-up instructions discussed: Patient encouraged to follow-up with their PCP in 3 days.                                     Medical Decision Making Amount and/or Complexity of Data Reviewed Labs: ordered. Radiology: ordered.  Risk Prescription drug management.   For this patient's complaint of abdominal pain, the following conditions were considered on the differential diagnosis: gastritis/PUD, enteritis/duodenitis, appendicitis, cholelithiasis/cholecystitis, cholangitis, pancreatitis, ruptured viscus, colitis, diverticulitis, small/large bowel obstruction, proctitis, cystitis, pyelonephritis, ureteral colic, aortic dissection, aortic aneurysm. In women, ectopic pregnancy, pelvic inflammatory disease, ovarian cysts, and tubo-ovarian abscess were also considered. Atypical chest etiologies were also considered including ACS, PE, and pneumonia.  Workup here today with labs and CT scan are reassuring.  No acute findings on CT.  No conditions identified that would require surgical or medical consultation.  Patient did recently increase dose on Ozempic.  This could be contributing to constipation and ileus which is making the symptoms worse.  Will treat symptomatically at this time.  The patient's vital signs, pertinent lab work and imaging were reviewed and interpreted as discussed in the ED course. Hospitalization was considered for further testing, treatments, or serial exams/observation. However as patient is well-appearing, has a stable exam, and reassuring studies today, I do not feel that they warrant admission at this time. This  plan was discussed with the patient who verbalizes agreement and comfort with this plan and seems reliable and able to return to the Emergency Department with worsening or changing symptoms.         Final diagnoses:  Generalized abdominal pain  Dehydration  Constipation, unspecified constipation type    ED Discharge Orders          Ordered    ondansetron  (ZOFRAN -ODT) 4 MG disintegrating tablet  Every 8 hours PRN        04/14/24 1319    dicyclomine  (BENTYL ) 20 MG tablet  2 times daily PRN        04/14/24 1319               Oprah Camarena, PA-C 04/14/24 1328    Tonia Chew, MD 04/14/24 1443

## 2024-04-14 NOTE — Discharge Instructions (Signed)
 Please read and follow all provided instructions.  Your diagnoses today include:  1. Generalized abdominal pain   2. Dehydration   3. Constipation, unspecified constipation type     Tests performed today include: Complete blood cell count: Normal white blood cell count, slightly low but improved hemoglobin at 11 Complete metabolic panel: Minimally low potassium otherwise unremarkable, normal liver function Lipase (pancreas function test): Normal pancreas testing Urinalysis (urine test): A few infection fighting cells noted CT scan of the abdomen pelvis did not show any concerning findings, you do have a fair amount of stool, a small benign liver hemangioma, small kidney stone inside the kidney Vital signs. See below for your results today.   Medications prescribed:  Zofran  (ondansetron ) - for nausea and vomiting  Bentyl  - medication for intestinal cramps and spasms  Take any prescribed medications only as directed.  Home care instructions:  Follow any educational materials contained in this packet. You may use MiraLAX as directed on packaging for constipation.  Follow-up instructions: Please follow-up with your primary care provider in the next 3 days for further evaluation of your symptoms.  I would not take another dose of Ozempic until your abdominal symptoms have improved and you discuss this with your primary care doctor.  Return instructions:  SEEK IMMEDIATE MEDICAL ATTENTION IF: The pain does not go away or becomes severe  A temperature above 101F develops  Repeated vomiting occurs (multiple episodes)  The pain becomes localized to portions of the abdomen. The right side could possibly be appendicitis. In an adult, the left lower portion of the abdomen could be colitis or diverticulitis.  Blood is being passed in stools or vomit (bright red or black tarry stools)  You develop chest pain, difficulty breathing, dizziness or fainting, or become confused, poorly responsive, or  inconsolable (young children) If you have any other emergent concerns regarding your health  Additional Information: Abdominal (belly) pain can be caused by many things. Your caregiver performed an examination and possibly ordered blood/urine tests and imaging (CT scan, x-rays, ultrasound). Many cases can be observed and treated at home after initial evaluation in the emergency department. Even though you are being discharged home, abdominal pain can be unpredictable. Therefore, you need a repeated exam if your pain does not resolve, returns, or worsens. Most patients with abdominal pain don't have to be admitted to the hospital or have surgery, but serious problems like appendicitis and gallbladder attacks can start out as nonspecific pain. Many abdominal conditions cannot be diagnosed in one visit, so follow-up evaluations are very important.  Your vital signs today were: BP 137/65   Pulse 77   Temp 98.1 F (36.7 C) (Oral)   Resp 20   Ht 5' 7 (1.702 m)   Wt 103.9 kg   LMP  (LMP Unknown)   SpO2 100%   BMI 35.87 kg/m  If your blood pressure (bp) was elevated above 135/85 this visit, please have this repeated by your doctor within one month. --------------

## 2024-04-14 NOTE — ED Triage Notes (Signed)
 Pt reports lower abd pain w/ malaise x5 days. C/o 'dark urine', no other UTI sx. Nausea, bloating, RLQ tenderness. Went to [facility] today, UTI was r/o, sent here for imaging. Significant hx kidney stones. Denies vomiting, denies gross blood in urine or stool. LBM x4-5 days ago. Started Ozempic x2 weeks ago.

## 2024-04-25 DIAGNOSIS — Z85828 Personal history of other malignant neoplasm of skin: Secondary | ICD-10-CM | POA: Diagnosis not present

## 2024-04-25 DIAGNOSIS — D1721 Benign lipomatous neoplasm of skin and subcutaneous tissue of right arm: Secondary | ICD-10-CM | POA: Diagnosis not present

## 2024-04-25 DIAGNOSIS — L853 Xerosis cutis: Secondary | ICD-10-CM | POA: Diagnosis not present

## 2024-04-25 DIAGNOSIS — D171 Benign lipomatous neoplasm of skin and subcutaneous tissue of trunk: Secondary | ICD-10-CM | POA: Diagnosis not present

## 2024-05-02 DIAGNOSIS — E7800 Pure hypercholesterolemia, unspecified: Secondary | ICD-10-CM | POA: Diagnosis not present

## 2024-05-02 DIAGNOSIS — E1169 Type 2 diabetes mellitus with other specified complication: Secondary | ICD-10-CM | POA: Diagnosis not present

## 2024-05-02 DIAGNOSIS — E669 Obesity, unspecified: Secondary | ICD-10-CM | POA: Diagnosis not present

## 2024-05-02 DIAGNOSIS — E119 Type 2 diabetes mellitus without complications: Secondary | ICD-10-CM | POA: Diagnosis not present

## 2024-05-03 DIAGNOSIS — E119 Type 2 diabetes mellitus without complications: Secondary | ICD-10-CM | POA: Diagnosis not present

## 2024-05-14 DIAGNOSIS — Z23 Encounter for immunization: Secondary | ICD-10-CM | POA: Diagnosis not present

## 2024-05-14 DIAGNOSIS — E669 Obesity, unspecified: Secondary | ICD-10-CM | POA: Diagnosis not present

## 2024-05-14 DIAGNOSIS — E7800 Pure hypercholesterolemia, unspecified: Secondary | ICD-10-CM | POA: Diagnosis not present

## 2024-05-14 DIAGNOSIS — I10 Essential (primary) hypertension: Secondary | ICD-10-CM | POA: Diagnosis not present

## 2024-05-14 DIAGNOSIS — Z1331 Encounter for screening for depression: Secondary | ICD-10-CM | POA: Diagnosis not present

## 2024-05-14 DIAGNOSIS — E1169 Type 2 diabetes mellitus with other specified complication: Secondary | ICD-10-CM | POA: Diagnosis not present

## 2024-05-14 DIAGNOSIS — F419 Anxiety disorder, unspecified: Secondary | ICD-10-CM | POA: Diagnosis not present

## 2024-05-14 DIAGNOSIS — Z0000 Encounter for general adult medical examination without abnormal findings: Secondary | ICD-10-CM | POA: Diagnosis not present

## 2024-05-28 DIAGNOSIS — G4721 Circadian rhythm sleep disorder, delayed sleep phase type: Secondary | ICD-10-CM | POA: Diagnosis not present

## 2024-05-28 DIAGNOSIS — F419 Anxiety disorder, unspecified: Secondary | ICD-10-CM | POA: Diagnosis not present

## 2024-05-28 DIAGNOSIS — G2581 Restless legs syndrome: Secondary | ICD-10-CM | POA: Diagnosis not present

## 2024-05-28 DIAGNOSIS — D500 Iron deficiency anemia secondary to blood loss (chronic): Secondary | ICD-10-CM | POA: Diagnosis not present

## 2024-06-02 DIAGNOSIS — E119 Type 2 diabetes mellitus without complications: Secondary | ICD-10-CM | POA: Diagnosis not present

## 2024-06-02 DIAGNOSIS — E7800 Pure hypercholesterolemia, unspecified: Secondary | ICD-10-CM | POA: Diagnosis not present

## 2024-06-02 DIAGNOSIS — E1169 Type 2 diabetes mellitus with other specified complication: Secondary | ICD-10-CM | POA: Diagnosis not present

## 2024-06-02 DIAGNOSIS — E669 Obesity, unspecified: Secondary | ICD-10-CM | POA: Diagnosis not present

## 2024-06-03 DIAGNOSIS — E119 Type 2 diabetes mellitus without complications: Secondary | ICD-10-CM | POA: Diagnosis not present

## 2024-06-17 DIAGNOSIS — D500 Iron deficiency anemia secondary to blood loss (chronic): Secondary | ICD-10-CM | POA: Diagnosis not present

## 2024-06-17 DIAGNOSIS — G2581 Restless legs syndrome: Secondary | ICD-10-CM | POA: Diagnosis not present

## 2024-06-17 DIAGNOSIS — E860 Dehydration: Secondary | ICD-10-CM | POA: Diagnosis not present

## 2024-07-02 DIAGNOSIS — Z1231 Encounter for screening mammogram for malignant neoplasm of breast: Secondary | ICD-10-CM | POA: Diagnosis not present

## 2024-07-03 DIAGNOSIS — E119 Type 2 diabetes mellitus without complications: Secondary | ICD-10-CM | POA: Diagnosis not present

## 2024-07-30 ENCOUNTER — Telehealth: Payer: Self-pay

## 2024-07-30 NOTE — Telephone Encounter (Signed)
 Called to check in with patient, who had LAAO on 09/21/2023.   Left message to call back. Will offer to schedule overdue visit with Dr. Beverlee.

## 2024-07-31 DIAGNOSIS — G2581 Restless legs syndrome: Secondary | ICD-10-CM | POA: Diagnosis not present

## 2024-07-31 DIAGNOSIS — F322 Major depressive disorder, single episode, severe without psychotic features: Secondary | ICD-10-CM | POA: Diagnosis not present

## 2024-07-31 DIAGNOSIS — F411 Generalized anxiety disorder: Secondary | ICD-10-CM | POA: Diagnosis not present

## 2024-07-31 DIAGNOSIS — G4721 Circadian rhythm sleep disorder, delayed sleep phase type: Secondary | ICD-10-CM | POA: Diagnosis not present

## 2024-08-01 NOTE — Telephone Encounter (Signed)
 Left message to call back.

## 2024-08-02 DIAGNOSIS — E669 Obesity, unspecified: Secondary | ICD-10-CM | POA: Diagnosis not present

## 2024-08-02 DIAGNOSIS — E7800 Pure hypercholesterolemia, unspecified: Secondary | ICD-10-CM | POA: Diagnosis not present

## 2024-08-02 DIAGNOSIS — E1169 Type 2 diabetes mellitus with other specified complication: Secondary | ICD-10-CM | POA: Diagnosis not present

## 2024-08-02 DIAGNOSIS — E119 Type 2 diabetes mellitus without complications: Secondary | ICD-10-CM | POA: Diagnosis not present

## 2024-08-07 DIAGNOSIS — Z780 Asymptomatic menopausal state: Secondary | ICD-10-CM | POA: Diagnosis not present

## 2024-08-07 NOTE — Telephone Encounter (Signed)
 Left voicemail for patient to return call.

## 2024-08-15 DIAGNOSIS — E119 Type 2 diabetes mellitus without complications: Secondary | ICD-10-CM | POA: Diagnosis not present

## 2024-09-01 DIAGNOSIS — E119 Type 2 diabetes mellitus without complications: Secondary | ICD-10-CM | POA: Diagnosis not present

## 2024-09-01 DIAGNOSIS — E669 Obesity, unspecified: Secondary | ICD-10-CM | POA: Diagnosis not present

## 2024-09-01 DIAGNOSIS — E7800 Pure hypercholesterolemia, unspecified: Secondary | ICD-10-CM | POA: Diagnosis not present

## 2024-09-01 DIAGNOSIS — E1169 Type 2 diabetes mellitus with other specified complication: Secondary | ICD-10-CM | POA: Diagnosis not present

## 2024-09-11 ENCOUNTER — Telehealth: Payer: Self-pay | Admitting: Internal Medicine

## 2024-09-11 NOTE — Telephone Encounter (Signed)
 Called the patient to set up a lab and follow-up visit. She is aware of the information.

## 2024-10-07 ENCOUNTER — Other Ambulatory Visit: Payer: Self-pay | Admitting: Medical Oncology

## 2024-10-07 DIAGNOSIS — D500 Iron deficiency anemia secondary to blood loss (chronic): Secondary | ICD-10-CM

## 2024-10-08 ENCOUNTER — Other Ambulatory Visit: Payer: Self-pay | Admitting: Internal Medicine

## 2024-10-08 ENCOUNTER — Ambulatory Visit: Payer: Self-pay | Admitting: Internal Medicine

## 2024-10-08 ENCOUNTER — Inpatient Hospital Stay: Attending: Internal Medicine

## 2024-10-08 ENCOUNTER — Inpatient Hospital Stay: Admitting: Internal Medicine

## 2024-10-08 VITALS — BP 113/72 | HR 84 | Temp 97.7°F | Resp 17 | Ht 67.0 in | Wt 211.0 lb

## 2024-10-08 DIAGNOSIS — D500 Iron deficiency anemia secondary to blood loss (chronic): Secondary | ICD-10-CM

## 2024-10-08 DIAGNOSIS — D649 Anemia, unspecified: Secondary | ICD-10-CM

## 2024-10-08 DIAGNOSIS — D509 Iron deficiency anemia, unspecified: Secondary | ICD-10-CM

## 2024-10-08 LAB — CMP (CANCER CENTER ONLY)
ALT: 7 U/L (ref 0–44)
AST: 15 U/L (ref 15–41)
Albumin: 4.2 g/dL (ref 3.5–5.0)
Alkaline Phosphatase: 112 U/L (ref 38–126)
Anion gap: 15 (ref 5–15)
BUN: 10 mg/dL (ref 8–23)
CO2: 27 mmol/L (ref 22–32)
Calcium: 9.9 mg/dL (ref 8.9–10.3)
Chloride: 97 mmol/L — ABNORMAL LOW (ref 98–111)
Creatinine: 0.9 mg/dL (ref 0.44–1.00)
GFR, Estimated: >60 mL/min
Glucose, Bld: 110 mg/dL — ABNORMAL HIGH (ref 70–99)
Potassium: 3.3 mmol/L — ABNORMAL LOW (ref 3.5–5.1)
Sodium: 138 mmol/L (ref 135–145)
Total Bilirubin: 0.4 mg/dL (ref 0.0–1.2)
Total Protein: 7.4 g/dL (ref 6.5–8.1)

## 2024-10-08 LAB — CBC WITH DIFFERENTIAL (CANCER CENTER ONLY)
Abs Immature Granulocytes: 0.02 K/uL (ref 0.00–0.07)
Basophils Absolute: 0.1 K/uL (ref 0.0–0.1)
Basophils Relative: 1 %
Eosinophils Absolute: 0.2 K/uL (ref 0.0–0.5)
Eosinophils Relative: 3 %
HCT: 42.4 % (ref 36.0–46.0)
Hemoglobin: 13.1 g/dL (ref 12.0–15.0)
Immature Granulocytes: 0 %
Lymphocytes Relative: 26 %
Lymphs Abs: 1.8 K/uL (ref 0.7–4.0)
MCH: 25 pg — ABNORMAL LOW (ref 26.0–34.0)
MCHC: 30.9 g/dL (ref 30.0–36.0)
MCV: 80.8 fL (ref 80.0–100.0)
Monocytes Absolute: 0.4 K/uL (ref 0.1–1.0)
Monocytes Relative: 7 %
Neutro Abs: 4.2 K/uL (ref 1.7–7.7)
Neutrophils Relative %: 63 %
Platelet Count: 310 K/uL (ref 150–400)
RBC: 5.25 MIL/uL — ABNORMAL HIGH (ref 3.87–5.11)
RDW: 17.1 % — ABNORMAL HIGH (ref 11.5–15.5)
WBC Count: 6.7 K/uL (ref 4.0–10.5)
nRBC: 0 % (ref 0.0–0.2)

## 2024-10-08 LAB — IRON AND IRON BINDING CAPACITY (CC-WL,HP ONLY)
Iron: 38 ug/dL (ref 28–170)
Saturation Ratios: 10 % — ABNORMAL LOW (ref 10.4–31.8)
TIBC: 379 ug/dL (ref 250–450)
UIBC: 342 ug/dL

## 2024-10-08 LAB — FERRITIN: Ferritin: 12 ng/mL (ref 11–307)

## 2024-10-08 NOTE — Progress Notes (Signed)
 "     [facility] [phone]   [phone]  OFFICE PROGRESS NOTE  Claudene Pellet, MD [phone] 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  The patient is also on anticoagulation with Xarelto  and Repatha  which may have contributed more to her gastrointestinal blood loss.  PRIOR THERAPY: None  CURRENT THERAPY: Over the counter oral iron  tablet with vitamin C in addition to oral vitamin B12 supplements.   INTERVAL HISTORY: Lindsey Jordan 74 y.o. female returns to the clinic today for follow-up visit.Discussed the use of AI scribe software for clinical note transcription with the patient, who gave verbal consent to proceed.  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  She has a history of persistent iron  deficiency anemia with prior hemoglobin as low as 9.8 g/dL. Her current hemoglobin is 13.1 g/dL, and her iron  saturation is 10%. She is taking ferrous sulfate with vitamin C and oral vitamin B12 supplements. Ferritin results are pending. She is also on Xarelto  and Repatha .  She describes significant fatigue, limiting her activity to moving between her bed and recliner. She does not exercise and attributes her fatigue in part to her medication regimen, including Ozempic for type 2 diabetes, which she has taken for eight months with a 35-pound weight loss. She also experiences lightheadedness and occasional difficulty maintaining hydration. Despite improved blood counts, her fatigue persists and continues to impact her daily function.  She reports gastrointestinal discomfort, specifically aching in her intestines approximately 20  minutes after eating, sometimes severe enough to cause her to bend over, and occasional gas. She suspects these symptoms are related to Ozempic, having previously discontinued Mounjaro due to cost. She continues to eat iron -rich foods, encouraged by her husband, and feels this may have contributed to her improved blood counts.  She has restless legs syndrome managed with gabapentin and experiences difficulty sleeping, though she does not attribute this directly to her iron  status. She prefers not to change her current medication for restless legs.     MEDICAL HISTORY: Past Medical History:  Diagnosis Date   Diabetes mellitus without complication (HCC)    Hypertension    Kidney stones    Morbid obesity (HCC)    Paroxysmal atrial fibrillation (HCC)    PONV (postoperative nausea and vomiting)    Presence of Watchman left atrial appendage closure device 09/21/2023   35mm Watchman FLX Pro device placed by Dr. Cindie    ALLERGIES:  is allergic to clarithromycin, statins, welchol [colesevelam hcl], and welchol [colesevelam].  MEDICATIONS:  Current Outpatient Medications  Medication Sig Dispense Refill   acetaminophen  (TYLENOL ) 500 MG tablet Take 1,000 mg by mouth at bedtime.     acyclovir (ZOVIRAX) 200 MG capsule Take 200 mg by mouth daily.     amitriptyline  (ELAVIL ) 10 MG tablet Take 5 mg by mouth at bedtime as needed for sleep.     amoxicillin  (AMOXIL ) 500 MG tablet TAKE 4 TABLETS BY MOUTH 1 HOUR PRIOR TO DENTAL WORK 8 tablet 0   aspirin  EC 81 MG tablet Take 1 tablet (81 mg total) by mouth daily. Swallow whole.     clonazePAM  (KLONOPIN ) 1 MG tablet Take 1 mg by mouth at bedtime.     dicyclomine  (  BENTYL ) 20 MG tablet Take 1 tablet (20 mg total) by mouth 2 (two) times daily as needed for spasms. 20 tablet 0   diphenhydrAMINE  (BENADRYL ) 25 MG tablet Take 50 mg by mouth at bedtime as needed for allergies or sleep.     escitalopram  (LEXAPRO ) 10 MG tablet Take 10 mg by mouth at bedtime.      Evolocumab  (REPATHA  SURECLICK) 140 MG/ML SOAJ INJECT 1 PEN INTO THE SKIN EVERY 14 (FOURTEEN) DAYS. 6 mL 3   fluticasone (FLONASE) 50 MCG/ACT nasal spray Place 2 sprays into both nostrils daily.     metoprolol  tartrate (LOPRESSOR ) 50 MG tablet TAKE 1 TABLET BY MOUTH TWICE A DAY 180 tablet 1   nitroGLYCERIN  (NITROSTAT ) 0.4 MG SL tablet PLACE 1 TABLET UNDER THE TONGUE EVERY 5 MINUTES X 3 DOSES AS NEEDED FOR CHEST PAIN. 25 tablet 2   ondansetron  (ZOFRAN -ODT) 4 MG disintegrating tablet Take 1 tablet (4 mg total) by mouth every 8 (eight) hours as needed for nausea or vomiting. 10 tablet 0   Semaglutide,0.25 or 0.5MG /DOS, (OZEMPIC, 0.25 OR 0.5 MG/DOSE,) 2 MG/3ML SOPN Inject 2.5 mg into the skin once a week.     triamterene-hydrochlorothiazide (MAXZIDE-25) 37.5-25 MG tablet Take 1 tablet by mouth daily.     No current facility-administered medications for this visit.    SURGICAL HISTORY:  Past Surgical History:  Procedure Laterality Date   ABDOMINAL HYSTERECTOMY  2003   ADENOIDECTOMY     age 67   ATRIAL FIBRILLATION ABLATION N/A 04/06/2021   Procedure: ATRIAL FIBRILLATION ABLATION;  Surgeon: Kelsie Agent, MD;  Location: [facility];  Service: Cardiovascular;  Laterality: N/A;   BREAST LUMPECTOMY     x 2 in her 20's   BREAST SURGERY     CESAREAN SECTION     one previous   ECTOPIC PREGNANCY SURGERY     ESOPHAGOGASTRODUODENOSCOPY (EGD) WITH PROPOFOL  N/A 12/03/2022   Procedure: ESOPHAGOGASTRODUODENOSCOPY (EGD) WITH PROPOFOL ;  Surgeon: Dianna Specking, MD;  Location: [facility];  Service: Gastroenterology;  Laterality: N/A;   GIVENS CAPSULE STUDY N/A 12/03/2022   Procedure: GIVENS CAPSULE STUDY;  Surgeon: Dianna Specking, MD;  Location: [facility];  Service: Gastroenterology;  Laterality: N/A;   HEMORRHOID SURGERY     in 20's   LEFT ATRIAL APPENDAGE OCCLUSION N/A 09/21/2023   Procedure: LEFT ATRIAL APPENDAGE OCCLUSION;  Surgeon: Cindie Ole DASEN, MD;  Location: [facility];   Service: Cardiovascular;  Laterality: N/A;   LEFT HEART CATH AND CORONARY ANGIOGRAPHY N/A 11/23/2020   Procedure: LEFT HEART CATH AND CORONARY ANGIOGRAPHY;  Surgeon: Claudene Victory ORN, MD;  Location: [facility];  Service: Cardiovascular;  Laterality: N/A;   TEE WITHOUT CARDIOVERSION N/A 04/06/2021   Procedure: TRANSESOPHAGEAL ECHOCARDIOGRAM (TEE);  Surgeon: Barbaraann Darryle Ned, MD;  Location: [facility];  Service: Cardiovascular;  Laterality: N/A;   TONSILLECTOMY     TRANSESOPHAGEAL ECHOCARDIOGRAM (CATH LAB) N/A 09/21/2023   Procedure: TRANSESOPHAGEAL ECHOCARDIOGRAM;  Surgeon: Cindie Ole DASEN, MD;  Location: [facility] IN[facility]AS[facility] CV LAB;  Service: Cardiovascular;  Laterality: N/A;    REVIEW OF SYSTEMS:  Constitutional: positive for fatigue Eyes: negative Ears, nose, mouth, throat, and face: negative Respiratory: negative Cardiovascular: negative Gastrointestinal: positive for abdominal pain and change in bowel habits Genitourinary:negative Integument/breast: negative Hematologic/lymphatic: negative Musculoskeletal:negative Neurological: positive for dizziness Behavioral/Psych: negative Endocrine: negative Allergic/Immunologic: negative   PHYSICAL EXAMINATION: General appearance: alert, cooperative, fatigued, and no distress Head: Normocephalic, without obvious abnormality, atraumatic Neck: no adenopathy, no JVD, supple, symmetrical, trachea midline, and  thyroid  not enlarged, symmetric, no tenderness/mass/nodules Lymph nodes: Cervical, supraclavicular, and axillary nodes normal. Resp: clear to auscultation bilaterally Back: symmetric, no curvature. ROM normal. No CVA tenderness. Cardio: regular rate and rhythm, S1, S2 normal, no murmur, click, rub or gallop GI: soft, non-tender; bowel sounds normal; no masses,  no organomegaly Extremities: extremities normal, atraumatic, no cyanosis or edema Neurologic: Alert and oriented X 3, normal strength and tone. Normal symmetric reflexes.  Normal coordination and gait  ECOG PERFORMANCE STATUS: 1 - Symptomatic but completely ambulatory  Blood pressure 113/72, pulse 84, temperature 97.7 F (36.5 C), temperature source Temporal, resp. rate 17, height 5' 7 (1.702 m), weight 211 lb (95.7 kg), SpO2 98%.  LABORATORY DATA: Lab Results  Component Value Date   WBC 6.7 10/08/2024   HGB 13.1 10/08/2024   HCT 42.4 10/08/2024   MCV 80.8 10/08/2024   PLT 310 10/08/2024      Chemistry      Component Value Date/Time   NA 138 10/08/2024 0908   NA 138 10/30/2023 1013   K 3.3 (L) 10/08/2024 0908   CL 97 (L) 10/08/2024 0908   CO2 27 10/08/2024 0908   BUN 10 10/08/2024 0908   BUN 12 10/30/2023 1013   CREATININE 0.90 10/08/2024 0908      Component Value Date/Time   CALCIUM 9.9 10/08/2024 0908   ALKPHOS 112 10/08/2024 0908   AST 15 10/08/2024 0908   ALT 7 10/08/2024 0908   BILITOT 0.4 10/08/2024 0908       RADIOGRAPHIC STUDIES: No results found.  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  The patient is also on anticoagulation with Xarelto  and Repatha  which may have contributed more to her gastrointestinal blood loss. Repeat CBC today showed improvement of her hemoglobin to 13.1 and hematocrit 42.4%.  Serum iron  is 38 with iron  saturation of 10%. Assessment and Plan Assessment & Plan Iron  deficiency anemia secondary to gastrointestinal bleeding from arteriovenous malformation Anemia is well controlled with hemoglobin at 13.1 g/dL and adequate serum iron , though iron  saturation remains low and ferritin is pending. She experiences significant fatigue and poor performance status, which is disproportionate to the degree of anemia. Functional impairment and limited activity likely contribute to her fatigue and overall  prognosis. - Continued over-the-counter ferrous sulfate with vitamin C supplementation. - Continued oral vitamin B12 supplementation. - Awaited pending ferritin results. - If ferritin is markedly low, arranged iron  infusion. - If ferritin is not low, continued current oral iron  regimen. - Scheduled follow-up in three months. - Encouraged increased physical activity to address fatigue. - Advised maintenance of hydration, particularly in the context of lightheadedness. - Discussed dietary sources of iron  and encouraged ongoing intake of iron -rich foods. The patient was advised to call immediately if she has any other concerning symptoms in the interval. The patient voices understanding of current disease status and treatment options and is in agreement with the current care plan.  All questions were answered. The patient knows to call the clinic with any problems, questions or concerns. We can certainly see the patient much sooner if necessary.  The total time spent in the appointment was 30 minutes.  Disclaimer: This note was dictated with voice recognition software. Similar sounding words can inadvertently be transcribed and may not be corrected upon review.        "

## 2024-10-09 ENCOUNTER — Encounter: Payer: Self-pay | Admitting: Internal Medicine

## 2024-10-09 ENCOUNTER — Telehealth: Payer: Self-pay | Admitting: Pharmacy Technician

## 2024-10-09 DIAGNOSIS — D509 Iron deficiency anemia, unspecified: Secondary | ICD-10-CM | POA: Insufficient documentation

## 2024-10-09 NOTE — Addendum Note (Signed)
 Addended by: DAYNE SHERRY RAMAN on: 10/09/2024 06:53 AM   Modules accepted: Orders

## 2024-10-09 NOTE — Telephone Encounter (Signed)
 Auth Submission: NO AUTH NEEDED Site of care: Site of care: [facility] Payer: [employer] Medicare Medication & CPT/J Code(s) submitted: Venofer  (Iron  Sucrose) J1756 Diagnosis Code: D64.9 Route of submission (phone, fax, portal): phone Phone # [phone] Fax # Auth type: Buy/Bill PB Units/visits requested: 3 Reference number: [id number] Approval from: 10/11/2024 to 01/30/2025

## 2024-10-11 ENCOUNTER — Ambulatory Visit

## 2024-10-11 VITALS — BP 126/70 | HR 76 | Temp 97.6°F | Resp 14 | Ht 67.0 in | Wt 207.8 lb

## 2024-10-11 DIAGNOSIS — D649 Anemia, unspecified: Secondary | ICD-10-CM

## 2024-10-11 DIAGNOSIS — D509 Iron deficiency anemia, unspecified: Secondary | ICD-10-CM | POA: Diagnosis not present

## 2024-10-11 MED ORDER — IRON SUCROSE 300 MG IVPB - SIMPLE MED
300.0000 mg | Status: DC
  Administered 2024-10-11: 300 mg via INTRAVENOUS
  Filled 2024-10-11: qty 265

## 2024-10-11 NOTE — Progress Notes (Signed)
 Diagnosis: Iron  Deficiency Anemia  Provider:  Praveen Mannam MD  Procedure: IV Infusion  IV Type: Peripheral, IV Location: L Antecubital   Venofer  (Iron  Sucrose), Dose: 300 mg  Infusion Start Time: 1316  Infusion Stop Time: 1502  Post Infusion IV Care: Observation period completed and Peripheral IV Discontinued  Discharge: Condition: Good, Destination: Home . AVS Declined  Performed by:  Leita FORBES Miles, LPN

## 2024-10-18 ENCOUNTER — Ambulatory Visit

## 2024-10-18 VITALS — BP 108/69 | HR 85 | Temp 98.0°F | Resp 16 | Ht 67.0 in | Wt 207.4 lb

## 2024-10-18 DIAGNOSIS — D649 Anemia, unspecified: Secondary | ICD-10-CM

## 2024-10-18 DIAGNOSIS — D509 Iron deficiency anemia, unspecified: Secondary | ICD-10-CM | POA: Diagnosis not present

## 2024-10-18 MED ORDER — IRON SUCROSE 300 MG IVPB - SIMPLE MED
300.0000 mg | Status: DC
  Administered 2024-10-18: 300 mg via INTRAVENOUS
  Filled 2024-10-18: qty 265

## 2024-10-18 NOTE — Progress Notes (Signed)
 Diagnosis:  Iron  Deficiency Anemia  Provider:  Praveen Mannam MD  Procedure: IV Infusion  IV Type: Peripheral, IV Location: L Antecubital   Venofer  (Iron  Sucrose), Dose: 300 mg  Infusion Start Time: 1321  Infusion Stop Time: 1459  Post Infusion IV Care: Observation period completed and Peripheral IV Discontinued  Discharge: Condition: Good, Destination: Home . AVS Declined  Performed by:  Maximiano JONELLE Pouch, LPN

## 2024-10-25 ENCOUNTER — Ambulatory Visit

## 2024-10-25 VITALS — BP 129/70 | HR 79 | Temp 98.4°F | Resp 16 | Ht 67.0 in | Wt 210.0 lb

## 2024-10-25 DIAGNOSIS — D509 Iron deficiency anemia, unspecified: Secondary | ICD-10-CM

## 2024-10-25 DIAGNOSIS — D649 Anemia, unspecified: Secondary | ICD-10-CM

## 2024-10-25 MED ORDER — IRON SUCROSE 300 MG IVPB - SIMPLE MED
300.0000 mg | Status: DC
  Administered 2024-10-25: 300 mg via INTRAVENOUS
  Filled 2024-10-25: qty 265

## 2024-10-25 NOTE — Progress Notes (Signed)
 Diagnosis: Iron  Deficiency Anemia  Provider:  Mannam, Praveen MD  Procedure: IV Infusion  IV Type: Peripheral, IV Location: L Antecubital  Venofer  (Iron  Sucrose), Dose: 300 mg  Infusion Start Time: 1342  Infusion Stop Time: 1520  Post Infusion IV Care: Observation period completed and Peripheral IV Discontinued  Discharge: Condition: Good, Destination: Home . AVS Declined  Performed by:  Rocky FORBES Search, RN

## 2025-01-07 ENCOUNTER — Inpatient Hospital Stay: Admitting: Internal Medicine

## 2025-01-07 ENCOUNTER — Inpatient Hospital Stay
